# Patient Record
Sex: Female | Born: 1960 | Race: White | Hispanic: No | Smoking: Former smoker
Health system: Southern US, Community
[De-identification: ages and names within clinical notes are randomized; demographics above are authoritative.]

## PROBLEM LIST (undated history)

## (undated) DIAGNOSIS — J189 Pneumonia, unspecified organism: Secondary | ICD-10-CM

## (undated) DIAGNOSIS — G43909 Migraine, unspecified, not intractable, without status migrainosus: Secondary | ICD-10-CM

## (undated) DIAGNOSIS — E039 Hypothyroidism, unspecified: Secondary | ICD-10-CM

## (undated) DIAGNOSIS — E079 Disorder of thyroid, unspecified: Secondary | ICD-10-CM

## (undated) DIAGNOSIS — F32A Depression, unspecified: Secondary | ICD-10-CM

## (undated) DIAGNOSIS — I209 Angina pectoris, unspecified: Secondary | ICD-10-CM

## (undated) DIAGNOSIS — I10 Essential (primary) hypertension: Secondary | ICD-10-CM

## (undated) DIAGNOSIS — E785 Hyperlipidemia, unspecified: Secondary | ICD-10-CM

## (undated) DIAGNOSIS — M1990 Unspecified osteoarthritis, unspecified site: Secondary | ICD-10-CM

## (undated) DIAGNOSIS — R0600 Dyspnea, unspecified: Secondary | ICD-10-CM

## (undated) DIAGNOSIS — I2510 Atherosclerotic heart disease of native coronary artery without angina pectoris: Secondary | ICD-10-CM

## (undated) DIAGNOSIS — F329 Major depressive disorder, single episode, unspecified: Secondary | ICD-10-CM

## (undated) HISTORY — DX: Hyperlipidemia, unspecified: E78.5

## (undated) HISTORY — PX: TONSILLECTOMY: SUR1361

## (undated) HISTORY — PX: BACK SURGERY: SHX140

## (undated) HISTORY — DX: Depression, unspecified: F32.A

## (undated) HISTORY — DX: Migraine, unspecified, not intractable, without status migrainosus: G43.909

## (undated) HISTORY — PX: CHOLECYSTECTOMY: SHX55

## (undated) HISTORY — DX: Major depressive disorder, single episode, unspecified: F32.9

## (undated) HISTORY — PX: OTHER SURGICAL HISTORY: SHX169

---

## 1992-04-20 DIAGNOSIS — Z9889 Other specified postprocedural states: Secondary | ICD-10-CM | POA: Insufficient documentation

## 1998-05-31 ENCOUNTER — Encounter: Payer: Self-pay | Admitting: Emergency Medicine

## 1998-05-31 ENCOUNTER — Emergency Department (HOSPITAL_COMMUNITY): Admission: EM | Admit: 1998-05-31 | Discharge: 1998-05-31 | Payer: Self-pay | Admitting: Emergency Medicine

## 2005-03-21 ENCOUNTER — Emergency Department (HOSPITAL_COMMUNITY): Admission: EM | Admit: 2005-03-21 | Discharge: 2005-03-21 | Payer: Self-pay | Admitting: Emergency Medicine

## 2005-04-20 DIAGNOSIS — F329 Major depressive disorder, single episode, unspecified: Secondary | ICD-10-CM | POA: Insufficient documentation

## 2005-10-05 ENCOUNTER — Ambulatory Visit: Payer: Self-pay | Admitting: Critical Care Medicine

## 2007-06-27 ENCOUNTER — Encounter (INDEPENDENT_AMBULATORY_CARE_PROVIDER_SITE_OTHER): Payer: Self-pay | Admitting: General Surgery

## 2007-06-27 ENCOUNTER — Inpatient Hospital Stay (HOSPITAL_COMMUNITY): Admission: EM | Admit: 2007-06-27 | Discharge: 2007-07-04 | Payer: Self-pay | Admitting: Emergency Medicine

## 2007-06-30 ENCOUNTER — Ambulatory Visit: Payer: Self-pay | Admitting: Gastroenterology

## 2008-01-11 ENCOUNTER — Ambulatory Visit: Payer: Self-pay | Admitting: Nurse Practitioner

## 2008-01-11 DIAGNOSIS — M545 Low back pain, unspecified: Secondary | ICD-10-CM | POA: Insufficient documentation

## 2008-01-11 DIAGNOSIS — R030 Elevated blood-pressure reading, without diagnosis of hypertension: Secondary | ICD-10-CM

## 2008-01-11 DIAGNOSIS — J45909 Unspecified asthma, uncomplicated: Secondary | ICD-10-CM | POA: Insufficient documentation

## 2008-01-11 DIAGNOSIS — J309 Allergic rhinitis, unspecified: Secondary | ICD-10-CM

## 2008-01-11 DIAGNOSIS — E669 Obesity, unspecified: Secondary | ICD-10-CM | POA: Insufficient documentation

## 2008-01-11 DIAGNOSIS — Z87891 Personal history of nicotine dependence: Secondary | ICD-10-CM | POA: Insufficient documentation

## 2008-01-11 DIAGNOSIS — F17200 Nicotine dependence, unspecified, uncomplicated: Secondary | ICD-10-CM

## 2008-01-11 DIAGNOSIS — E039 Hypothyroidism, unspecified: Secondary | ICD-10-CM | POA: Insufficient documentation

## 2008-01-17 ENCOUNTER — Ambulatory Visit: Payer: Self-pay | Admitting: Internal Medicine

## 2008-01-17 LAB — CONVERTED CEMR LAB
Albumin: 4.2 g/dL (ref 3.5–5.2)
Alkaline Phosphatase: 74 units/L (ref 39–117)
CO2: 25 meq/L (ref 19–32)
Chloride: 104 meq/L (ref 96–112)
Eosinophils Absolute: 0.3 10*3/uL (ref 0.0–0.7)
Glucose, Bld: 91 mg/dL (ref 70–99)
Lymphocytes Relative: 47 % — ABNORMAL HIGH (ref 12–46)
Lymphs Abs: 1.7 10*3/uL (ref 0.7–4.0)
Neutro Abs: 1.4 10*3/uL — ABNORMAL LOW (ref 1.7–7.7)
Neutrophils Relative %: 39 % — ABNORMAL LOW (ref 43–77)
Platelets: 264 10*3/uL (ref 150–400)
Potassium: 4.5 meq/L (ref 3.5–5.3)
Sodium: 140 meq/L (ref 135–145)
TSH: 52.359 microintl units/mL — ABNORMAL HIGH (ref 0.350–4.50)
Total Protein: 7.3 g/dL (ref 6.0–8.3)
WBC: 3.7 10*3/uL — ABNORMAL LOW (ref 4.0–10.5)

## 2008-01-24 ENCOUNTER — Encounter (INDEPENDENT_AMBULATORY_CARE_PROVIDER_SITE_OTHER): Payer: Self-pay | Admitting: Nurse Practitioner

## 2008-01-30 ENCOUNTER — Emergency Department (HOSPITAL_COMMUNITY): Admission: EM | Admit: 2008-01-30 | Discharge: 2008-01-31 | Payer: Self-pay | Admitting: Emergency Medicine

## 2008-01-30 ENCOUNTER — Telehealth (INDEPENDENT_AMBULATORY_CARE_PROVIDER_SITE_OTHER): Payer: Self-pay | Admitting: Nurse Practitioner

## 2008-01-31 ENCOUNTER — Encounter (INDEPENDENT_AMBULATORY_CARE_PROVIDER_SITE_OTHER): Payer: Self-pay | Admitting: Nurse Practitioner

## 2008-01-31 DIAGNOSIS — K219 Gastro-esophageal reflux disease without esophagitis: Secondary | ICD-10-CM | POA: Insufficient documentation

## 2008-01-31 DIAGNOSIS — G43909 Migraine, unspecified, not intractable, without status migrainosus: Secondary | ICD-10-CM | POA: Insufficient documentation

## 2008-02-07 ENCOUNTER — Ambulatory Visit: Payer: Self-pay | Admitting: Internal Medicine

## 2008-02-09 ENCOUNTER — Encounter (INDEPENDENT_AMBULATORY_CARE_PROVIDER_SITE_OTHER): Payer: Self-pay | Admitting: *Deleted

## 2008-02-27 ENCOUNTER — Ambulatory Visit: Payer: Self-pay | Admitting: Internal Medicine

## 2008-03-13 ENCOUNTER — Ambulatory Visit: Payer: Self-pay | Admitting: Internal Medicine

## 2008-04-10 ENCOUNTER — Ambulatory Visit: Payer: Self-pay | Admitting: Nurse Practitioner

## 2008-04-16 ENCOUNTER — Encounter (INDEPENDENT_AMBULATORY_CARE_PROVIDER_SITE_OTHER): Payer: Self-pay | Admitting: Nurse Practitioner

## 2008-04-16 DIAGNOSIS — E7800 Pure hypercholesterolemia, unspecified: Secondary | ICD-10-CM

## 2008-04-17 ENCOUNTER — Encounter (INDEPENDENT_AMBULATORY_CARE_PROVIDER_SITE_OTHER): Payer: Self-pay | Admitting: Nurse Practitioner

## 2008-04-25 ENCOUNTER — Ambulatory Visit (HOSPITAL_COMMUNITY): Admission: RE | Admit: 2008-04-25 | Discharge: 2008-04-25 | Payer: Self-pay | Admitting: Family Medicine

## 2008-04-30 ENCOUNTER — Encounter (INDEPENDENT_AMBULATORY_CARE_PROVIDER_SITE_OTHER): Payer: Self-pay | Admitting: Internal Medicine

## 2008-04-30 ENCOUNTER — Encounter: Admission: RE | Admit: 2008-04-30 | Discharge: 2008-04-30 | Payer: Self-pay | Admitting: Family Medicine

## 2008-07-16 ENCOUNTER — Telehealth (INDEPENDENT_AMBULATORY_CARE_PROVIDER_SITE_OTHER): Payer: Self-pay | Admitting: Nurse Practitioner

## 2008-07-18 ENCOUNTER — Encounter (INDEPENDENT_AMBULATORY_CARE_PROVIDER_SITE_OTHER): Payer: Self-pay | Admitting: *Deleted

## 2008-07-18 ENCOUNTER — Ambulatory Visit: Payer: Self-pay | Admitting: Nurse Practitioner

## 2008-07-18 DIAGNOSIS — J029 Acute pharyngitis, unspecified: Secondary | ICD-10-CM | POA: Insufficient documentation

## 2008-07-25 ENCOUNTER — Ambulatory Visit: Payer: Self-pay | Admitting: Nurse Practitioner

## 2008-07-25 DIAGNOSIS — R5381 Other malaise: Secondary | ICD-10-CM | POA: Insufficient documentation

## 2008-07-25 DIAGNOSIS — R5383 Other fatigue: Secondary | ICD-10-CM

## 2008-07-25 DIAGNOSIS — J329 Chronic sinusitis, unspecified: Secondary | ICD-10-CM | POA: Insufficient documentation

## 2008-07-26 ENCOUNTER — Encounter (INDEPENDENT_AMBULATORY_CARE_PROVIDER_SITE_OTHER): Payer: Self-pay | Admitting: Nurse Practitioner

## 2008-07-26 LAB — CONVERTED CEMR LAB
ALT: 13 units/L (ref 0–35)
Albumin: 4 g/dL (ref 3.5–5.2)
Basophils Relative: 0 % (ref 0–1)
Bilirubin, Direct: 0.1 mg/dL (ref 0.0–0.3)
Cholesterol: 175 mg/dL (ref 0–200)
Eosinophils Absolute: 0.1 10*3/uL (ref 0.0–0.7)
HDL: 52 mg/dL (ref >39)
Hemoglobin: 12.6 g/dL (ref 12.0–15.0)
Lymphs Abs: 1 10*3/uL (ref 0.7–4.0)
MCHC: 32.1 g/dL (ref 30.0–36.0)
MCV: 89.9 fL (ref 78.0–100.0)
Monocytes Absolute: 0.1 10*3/uL (ref 0.1–1.0)
Monocytes Relative: 5 % (ref 3–12)
Neutro Abs: 1 10*3/uL — ABNORMAL LOW (ref 1.7–7.7)
RBC: 4.37 M/uL (ref 3.87–5.11)
Total CHOL/HDL Ratio: 3.4
VLDL: 31 mg/dL (ref 0–40)
Vit D, 25-Hydroxy: 30 ng/mL (ref 30–89)

## 2008-08-10 ENCOUNTER — Encounter (INDEPENDENT_AMBULATORY_CARE_PROVIDER_SITE_OTHER): Payer: Self-pay | Admitting: Nurse Practitioner

## 2008-08-14 ENCOUNTER — Encounter (INDEPENDENT_AMBULATORY_CARE_PROVIDER_SITE_OTHER): Payer: Self-pay | Admitting: Nurse Practitioner

## 2008-09-05 ENCOUNTER — Ambulatory Visit: Payer: Self-pay | Admitting: Nurse Practitioner

## 2008-09-05 DIAGNOSIS — R799 Abnormal finding of blood chemistry, unspecified: Secondary | ICD-10-CM

## 2008-10-23 ENCOUNTER — Ambulatory Visit: Payer: Self-pay | Admitting: Nurse Practitioner

## 2008-10-23 DIAGNOSIS — D72819 Decreased white blood cell count, unspecified: Secondary | ICD-10-CM | POA: Insufficient documentation

## 2008-10-23 DIAGNOSIS — R498 Other voice and resonance disorders: Secondary | ICD-10-CM | POA: Insufficient documentation

## 2008-10-24 LAB — CONVERTED CEMR LAB
ANA Titer 1: NEGATIVE
Anti Nuclear Antibody(ANA): POSITIVE — AB
Basophils Absolute: 0 10*3/uL (ref 0.0–0.1)
Basophils Absolute: 0 10*3/uL (ref 0.0–0.1)
Basophils Relative: 0 % (ref 0–1)
Eosinophils Relative: 12 % — ABNORMAL HIGH (ref 0–5)
HCT: 37.5 % (ref 36.0–46.0)
HCT: 41 % (ref 36.0–46.0)
Hemoglobin: 13.1 g/dL (ref 12.0–15.0)
Lymphocytes Relative: 41 % (ref 12–46)
Lymphs Abs: 1.4 10*3/uL (ref 0.7–4.0)
MCHC: 32 g/dL (ref 30.0–36.0)
Monocytes Absolute: 0.1 10*3/uL (ref 0.1–1.0)
Monocytes Relative: 4 % (ref 3–12)
Neutro Abs: 1.8 10*3/uL (ref 1.7–7.7)
Neutrophils Relative %: 51 % (ref 43–77)
Platelets: 213 10*3/uL (ref 150–400)
RBC: 4.42 M/uL (ref 3.87–5.11)
RDW: 15.5 % (ref 11.5–15.5)
RDW: 15.7 % — ABNORMAL HIGH (ref 11.5–15.5)
Sed Rate: 32 mm/hr — ABNORMAL HIGH (ref 0–22)
WBC: 3.5 10*3/uL — ABNORMAL LOW (ref 4.0–10.5)

## 2008-11-15 ENCOUNTER — Ambulatory Visit: Payer: Self-pay | Admitting: Nurse Practitioner

## 2008-11-15 LAB — CONVERTED CEMR LAB
Rhuematoid fact SerPl-aCnc: 20 intl units/mL (ref 0–20)
Total CK: 58 units/L (ref 7–177)

## 2008-11-16 ENCOUNTER — Encounter (INDEPENDENT_AMBULATORY_CARE_PROVIDER_SITE_OTHER): Payer: Self-pay | Admitting: Nurse Practitioner

## 2010-05-18 LAB — CONVERTED CEMR LAB
Chlamydia, DNA Probe: NEGATIVE
GC Probe Amp, Genital: NEGATIVE
HDL: 50 mg/dL (ref >39)
Hemoglobin: 13.1 g/dL (ref 12.0–15.0)
Ketones, urine, test strip: NEGATIVE
LDL Cholesterol: 174 mg/dL — ABNORMAL HIGH (ref 0–99)
Lymphocytes Relative: 55 % — ABNORMAL HIGH (ref 12–46)
Lymphs Abs: 1.4 10*3/uL (ref 0.7–4.0)
Monocytes Absolute: 0.3 10*3/uL (ref 0.1–1.0)
Monocytes Relative: 12 % (ref 3–12)
Neutro Abs: 0.7 10*3/uL — ABNORMAL LOW (ref 1.7–7.7)
Nitrite: NEGATIVE
RBC: 4.51 M/uL (ref 3.87–5.11)
TSH: 3.722 microintl units/mL (ref 0.350–4.50)
Total CHOL/HDL Ratio: 5.2
Urobilinogen, UA: NEGATIVE

## 2010-09-02 NOTE — Op Note (Signed)
NAME:  [name], [name] NO.:  [id number]   MEDICAL RECORD NO.:  [id number]          PATIENT TYPE:  INP   LOCATION:  5023                         FACILITY:  [facility]   PHYSICIAN:  Sharlet Salina T. Hoxworth, M.D.DATE OF BIRTH:  07/18/1960   DATE OF PROCEDURE:  07/02/2007  DATE OF DISCHARGE:                               OPERATIVE REPORT   PREOPERATIVE DIAGNOSIS:  Trocar site wound dehiscence with incarcerated  small bowel.   POSTOPERATIVE DIAGNOSIS:  Trocar site wound dehiscence with incarcerated  small bowel.   SURGICAL PROCEDURE:  Repair of trocar site wound dehiscence.   SURGEON:  Lorne Skeens. Hoxworth, M.D.   ASSISTANT:  Sandria Bales. Ezzard Standing, M.D.   ANESTHESIA:  General.   BRIEF HISTORY:  Bethany Beard is a 50 year old female who is 5 days  status post laparoscopic cholecystectomy.  She has a Hassan trocar site  in the supraumbilical area.  She had a postoperative ERCP.  She  subsequently had some nausea and vomiting initially felt secondary to  her procedures but then KUB has shown evidence of small bowel  obstruction which has been persistent.  She is obese and her wounds have  been difficult to evaluate.  Today a CT scan was obtained which shows  incarceration of a loop of small bowel through her Hasson trocar site in  the supraumbilical area.  I have recommended proceeding to the operating  room for repair.  The nature of the procedure, indications and risks  were discussed and understood.  She is now brought to the operating room  for this procedure.   DESCRIPTION OF OPERATION:  The patient was brought to the operating room  and placed in supine position on the operating room table and general  endotracheal anesthesia was induced.  She was given preoperative IV  antibiotics.  The abdomen was widely sterilely prepped and draped.  The  previous small midline supraumbilical incision was excised back to  normal skin and extended for less than a centimeter in either  direction.  Blunt dissection was then used to open the subcutaneous tissue.  The  fascia was exposed and there was a very small loop of small intestine  that had worked up through the fascia just below the figure-of-eight  Vicryl suture which remained intact.  The Vicryl suture was removed,  opening the fascial defect which allowed the loop of small bowel to be  brought up into the wound and carefully examined.  It appeared  completely normal without hemorrhage or any evidence of damage to the  bowel.  The bowel was reduced into the peritoneal cavity.  The fascial  edges were clearly  defined.  The wound was then closed with several interrupted zero  Prolene sutures.  Following this the soft tissue was infiltrated with  Marcaine.  The wound was irrigated.  The skin was closed with  subcuticular 4-0 Monocryl and Dermabond.  Sponge and needle counts were  correct.  The patient was taken to the recovery room in good condition.      Lorne Skeens. Hoxworth, M.D.  Electronically Signed     BTH/MEDQ  D:  07/02/2007  T:  07/03/2007  Job:  [id number]

## 2010-09-02 NOTE — Op Note (Signed)
NAME:  Bethany Beard, Bethany Beard NO.:  [id number]   MEDICAL RECORD NO.:  [id number]          PATIENT TYPE:  INP   LOCATION:  5023                         FACILITY:  [facility]   PHYSICIAN:  Sharlet Salina T. Hoxworth, M.D.DATE OF BIRTH:  Dec 31, 1960   DATE OF PROCEDURE:  06/27/2007  DATE OF DISCHARGE:                               OPERATIVE REPORT   PREOPERATIVE DIAGNOSIS:  Cholelithiasis and acute cholecystitis.   POSTOPERATIVE DIAGNOSIS:  Cholelithiasis and acute cholecystitis with  probable common bile duct stone.   SURGICAL PROCEDURES:  Laparoscopic cholecystectomy with intraoperative  cholangiogram and balloon catheter common bile duct exploration.   SURGEON:  Lorne Skeens. Hoxworth, M.D.   ANESTHESIA:  General.   BRIEF HISTORY:  Murel Wigle is a 50 year old female who presents  with acute epigastric pain.  CT scan has revealed gallstones and  thickened gallbladder wall consistent with acute cholecystitis.  She has  mildly elevated LFTs, SGPT 57, alkaline phosphatase 168, and bilirubin  normal.  She is felt to have acute cholecystitis, and laparoscopic  cholecystectomy with cholangiogram has been recommended and accepted.  The nature of the procedure, indications, risks of bleeding, infection,  bile leak, bile duct injury, possible need for open procedure were  discussed and understood.  She is now brought to the operating room for  this procedure.   DESCRIPTION OF OPERATION:  The patient was brought to the operating room  and placed in the supine position on the operating table, and general  orotracheal anesthesia was induced.  The abdomen was widely sterilely  prepped and draped.  She was already on broad-spectrum antibiotics.  Correct patient and procedure were verified.  Local anesthesia was used  to infiltrate the trocar sites prior to the incisions.   A 1-cm incision was made above the umbilicus, and the dissection was  carried down to the midline fascia which was  sharply incised for 1 cm  and the peritoneum entered under direct vision.  Through a mattress  suture 0 Vicryl, the Hasson trocar was inserted and pneumoperitoneum  established.  Under direct vision, an 11-mm trocar was placed in the  subxiphoid area and two 5-mm trocars along the right subcostal margin.  The gallbladder was exposed and was tense and acutely inflamed.  It was  decompressed with an aspiration needle and contained clear bile.  The  wall was quite thick.  The fundus was grasped and elevated up over the  liver.  Fibrofatty tissue was stripped off the neck of the gallbladder  toward the porta hepatis.  The infundibulum was exposed and retracted  inferolaterally, and further fibrofatty tissue was stripped down toward  the cystic duct/gallbladder junction.  The cystic artery was identified  coursing up to the gallbladder wall.  The cystic duct/gallbladder  junction was identified and dissected 360 degrees and the cystic duct  dissected out over about a centimeter.  At this point, the cystic duct  was clipped at the gallbladder junction and an operative cholangiogram  obtained through the cystic duct.  This showed good filling of a normal  common bile duct and intrahepatic ducts.  At the distal common  bile  duct, there appeared to be a small filling defect, although there was  free flow into the duodenum.  At this point, I elected to try to clear  the common duct with a balloon catheter.  A #4 Fogarty balloon catheter was able to be passed via the cystic duct  into the duodenum.  I inflated the balloon and pulled back 2 times,  sweeping the common duct and cystic duct and did not obtain any stones  or debris.  At this point, a cholangiogram was repeated through the  cystic duct.  Likely the same lucency was seen in the distal common bile  duct, but at this point it appeared possibly to be more related to a  fold in the duodenum than a definite stone.  There was free flow.  I   elected to observe at this point.  The cystic duct was triply clipped  after removal of the cholangiocath and divided.  The cystic artery  clearly seen coursing upon the gallbladder wall was divided between two  proximal and one distal clip.  The gallbladder then dissected free from  its bed using hook cautery.  It was placed in an EndoCatch bag and  brought out through the supraumbilical incision.  Complete hemostasis  was obtained of the operative site, and the right upper quadrant was  thoroughly irrigated and suctioned until clear.  The trocars were  removed under direct vision and all CO2 evacuated.  The mattress suture  was secured at the umbilicus.  The skin incisions were closed with  subcuticular Monocryl and Steri-Strips.  Sponge and needle counts were  correct.  Sterile dressings were applied.   The patient was taken to the recovery room in good condition.      Lorne Skeens. Hoxworth, M.D.  Electronically Signed     BTH/MEDQ  D:  06/27/2007  T:  06/28/2007  Job:  [id number]

## 2010-09-02 NOTE — Discharge Summary (Signed)
NAME:  Bethany Beard, PIOTROWSKI             ACCOUNT NO.:  [id number]   MEDICAL RECORD NO.:  [id number]          PATIENT TYPE:  INP   LOCATION:  5023                         FACILITY:  [facility]   PHYSICIAN:  Dr. Freida Busman              DATE OF BIRTH:  [birth year]   DATE OF ADMISSION:  06/26/2007  DATE OF DISCHARGE:  07/04/2007                               DISCHARGE SUMMARY   ADMITTING PHYSICIAN:  Velora Heckler, M.D.   DISCHARGING PHYSICIAN:  Dr. Freida Busman.   OPERATING PHYSICIAN:  Sharlet Salina T. Hoxworth, M.D.   CONSULTING PHYSICIANS:  Jordan Hawks. Elnoria Howard, M.D. with Gastroenterology and  Venita Lick. Russella Dar, M.D., [facility] with Gastroenterology.   CHIEF COMPLAINT/REASON FOR ADMISSION:  [name] is a 50 year old  female patient who presented to the ER at [facility] with a 2-week history of  intermittent right upper quadrant abdominal pain associated with nausea  and vomiting.  In the ER, an ultrasound was obtained which demonstrated  a thick-walled gallbladder with a sonographic Murphy sign as well as  cholelithiasis.  Common bile duct was markedly dilated at 10 mm and  common bile duct stones could not be excluded by ultrasound exam.  In  the ER, the patient remains symptomatic with pain, nausea, and vomiting  and therefore, general surgical admission was requested.   On clinical exam, the patient was afebrile.  Vital signs were stable.  Her abdominal exam reveals an obese, soft abdomen without prior surgical  wounds, mild-to-moderate tenderness in the right upper quadrant with  palpation, associated voluntary guarding, no palpable mass, no  rebounding.   White count 8,200 and hemoglobin 12.8.  SGPT 57 and alkaline phos 168.  Total bilirubin 0.9 and lipase 20.  Ultrasound as previously noted.   Patient was admitted by Dr. Gerrit Friends with the following diagnoses:  1. Biliary colic with subacute cholecystitis.  2. Cholelithiasis, rule out choledocholithiasis.   HOSPITAL COURSE:  The patient was admitted to the  general surgical  floor, where she was subsequently taken to the OR the following day on  June 27, 2007, by Dr. Johna Sheriff for laparoscopic cholecystectomy with  intraoperative cholangiogram and balloon catheter common bile duct  exploration.  Postprocedure intraoperative cholangiogram still remained  inconclusive for possible retained common duct stone, so GI consult was  obtained.  Dr. Russella Dar evaluated the patient and plans were to proceed  with ERCP.   On June 28, 2007, a gastroenterology consult was obtained, requested  for need for ERCP.  Dr. Russella Dar did evaluate the patient and on June 29, 2007, the patient underwent an ERCP.  No stone was seen.  I suspected  that patient had passed the stone prior to the ERCP.  The patient was  restarted on a diet and was started on Vicodin for pain while she was  tolerating full liquids. The patient in the past has tolerated Vicodin,  but she must take with food to prevent nausea and vomiting.  On postop  day 3, after the ERCP, the patient developed persistent nausea the night  before around 10 p.m., took a Vicodin and  threw up, but then had  persistent nausea and now had new left upper quadrant pain, anorexia but  she was still passing flatus.  Her abdomen was soft, she was tender  around her incisions with the new tenderness with minimal guarding in  the left upper quadrant, and bowel sounds were present.  There was an  index of suspicion for post-ERCP pancreatitis, so labs were checked and  this did reveal a mild elevation in amylase to 118 and lipase to 91.  She had been continued on IV Unasyn and her medications were changed to  IV route and she was made n.p.o.  In addition, plain abdominal films  were obtained and an unusual finding of dilated loops of small bowel in  the mid and left upper quadrant with air-fluid levels as well as air and  contrast in the colon were a concern for either an evolving ileus or  evolving partial small bowel  obstruction.  The patient was left n.p.o.  By postop day 4, the patient's amylase and lipase had resolved, and her  LFTs were normal.  She continued with nausea and vomiting and diffuse  abdominal pain especially in the upper quadrant.  She at this point was  not passing any flatus or having any bowel movements.  There was some  concern that the patient may be evolving an ileus or bowel obstruction  because of a bile leak.  HIDA scan was performed and this did not  demonstrate a bile leak.  Uncertain why the patient was having the bowel  obstruction, nothing could be seen on clinical exam of the abdomen.   By the postop day #5, the patient continued with the symptoms of the  bowel obstruction.  Plain films continued to reveal this and a CT also  demonstrated a bowel obstruction at the level of the umbilicus and small  knuckle of small bowel had protruded through the umbilical incision  trocar site.  This was not clearly apparent on clinical exam due to  patient having a morbidly obese abdomen.  These findings were discussed  with the patient for Dr. Johna Sheriff and it was determined that she needed  to return to the OR for repair of this wound dehiscence and incarcerated  small bowel.  She was taken to the OR on July 01, 2007, which was  postop day 5 by Dr. Johna Sheriff where she underwent repair of the wound  dehiscence.  The patient tolerated this procedure well and was sent back  to the surgical floor to recover.   By postop day 6, from the initial laparoscopic cholecystectomy and  postop day 1 of the hernia repair, the patient was doing very well, no  further nausea.  Abdomen is soft and nontender with normal active bowel  sounds.  Her diet was advanced as tolerated and except for having nausea  and vomiting after consuming a large milk shake rapidly, the patient has  remained asymptomatic.  By postop day 7 of the first surgery, postop day  2 of the hernia repair, the patient was doing  extremely well.  No  further nausea or vomiting with the diet since nausea and vomiting was  brought about by use of the milk shake.  She had not tried any oral pain  medications.  In the past, she has had difficulty with Vicodin, but now  she must take this with food.  We discussed this, and she will try  Vicodin at home.  She has been given a  prescription for Ultram to use  with over-the-counter Tylenol if Vicodin induces nausea or vomiting.  Otherwise, the patient reports eagerness and readiness for discharge  home and clinically she meets criteria.  Last lab workup was obtained on  July 02, 2007, AST 16, ALT 25, alkaline phosphatase 151, total  bilirubin 0.7, amylase 91, and lipase 39.   FINAL DISCHARGE DIAGNOSES:  1. Right upper quadrant abdominal pain, secondary to biliary colic and      subacute cholecystitis.  2. Status post laparoscopic cholecystectomy with abnormal      intraoperative cholangiogram.  3. Choledocholithiasis, status post endoscopic retrograde      cholangiopancreatography, stone passed prior to endoscopic      retrograde cholangiopancreatography.  4. Mild post-endoscopic retrograde cholangiopancreatography      pancreatitis, resolved.  5. Nausea and vomiting with small bowel obstruction, secondary to      umbilical trocar site with wound dehiscence and subsequent small      bowel herniation.  6. Status post repair of the trocar umbilical wound dehiscence with      associated bowel obstruction.  No small bowel resection done.   DISCHARGE MEDICATIONS:  The patient will resume the following home  medications:  1. Synthroid 112 mcg daily.  2. Celexa 40 mg daily.  3. Xanax 0.5 mg q.6 h. p.r.n. anxiety.   New medications include:  1. Vicodin 5/325 mg 1-2 tabs every 4 hours as needed for pain.  2. Over-the-counter ibuprofen 1-3 tabs every 6-8 hours as needed for      pain.  May take in addition to Vicodin, always take with food or      snack.  3. Ultram 25 mg  1 every 6 hours with 2 regular strength Tylenol if      Vicodin causes nausea or vomiting.   DISCHARGE INSTRUCTIONS:  The patient is to be out of work now for 4  weeks given the fact she had a wound dehiscence requiring repair for  hernia and small bowel entrapment at this hospitalization therefore, she  will be out of work for 4 weeks and no lifting greater than 15 pounds  for 4 weeks.   OTHER ACTIVITY:  Increase activity slowly, may walk up steps, may  shower, no driving for 1-2 weeks.   WOUND CARE:  No wound care is indicated.  The patient's skin has been  closed with Dermabond.  She does have some slight redness around the  umbilical incision that is either evolving cellulitis versus evolving  bruise.  The appearance was more consistent with the bruise in the areas  not warm and only minimally tender consistent with same tenderness at  the other incisions.  The patient has remained afebrile, and she has had  no white count.   DIET:  As previous.   FOLLOWUP:  She needs to call Dr. Johna Sheriff herself and has to be seen in  2 weeks.  In addition, she is to call the surgeon's office if:  A.  Fever greater than her or equal to 101 degrees Fahrenheit.  B.  New or increased pelvic pain.  C.  Redness or drainage from wounds.  D.  Nausea, vomiting, diarrhea, or constipation.      Allison L. Rennis Harding, N.P.    ______________________________  Dr. Freida Busman    ALE/MEDQ  D:  07/04/2007  T:  07/05/2007  Job:  [id number]   cc:   Venita Lick. Russella Dar, MD, Dalbert Mayotte T. Hoxworth, M.D.

## 2010-09-02 NOTE — H&P (Signed)
NAME:  Bethany Beard, Bethany Beard NO.:  [id number]   MEDICAL RECORD NO.:  [id number]          PATIENT TYPE:  EMS   LOCATION:  MAJO                         FACILITY:  [facility]   PHYSICIAN:  Velora Heckler, MD      DATE OF BIRTH:  [birth year]   DATE OF ADMISSION:  06/26/2007  DATE OF DISCHARGE:                              HISTORY & PHYSICAL   REFERRING PHYSICIAN:  Doug Sou, M.D., emergency department.   PRIMARY CARE PHYSICIAN:  Lovenia Kim, D.O.   CHIEF COMPLAINT:  Abdominal pain, right upper quadrant.   HISTORY OF PRESENT ILLNESS:  Bethany Beard is a 50 year old white  female from [place], [place].  She presents to the emergency  department with a 2-week history of intermittent right upper quadrant  abdominal pain associated with nausea and vomiting.  The patient  presented to the emergency department for evaluation.  Ultrasound of the  abdomen was obtained which showed a thick-walled gallbladder with  sonographic Murphy's sign.  Gallstones were present.  Common bile duct  was dilated at approximately 10 mm.  Common bile duct stones could not  be excluded.  The patient remained symptomatic with pain, nausea and  vomiting.  General surgery was called for admission.   PAST MEDICAL HISTORY:  1. History of hypothyroidism.  2. Status post knee arthroscopy.  3. Status post lumbar diskectomy.  4. Status post carpal tunnel release.   MEDICATIONS:  Synthroid, Celexa, Xanax.   ALLERGIES:  ASPIRIN, CODEINE.   SOCIAL HISTORY:  The patient is widowed.  She has one child.  She works  at [employer].  She has a degree from [employer] in  [place].  She smokes less than a pack of cigarettes a day.  She drinks alcohol on occasion.   FAMILY HISTORY:  Not obtainable.  The patient is adopted.   REVIEW OF SYSTEMS:  A 15-system review without significant other  findings except as noted above.   PHYSICAL EXAMINATION:  GENERAL APPEARANCE:   A 50 year old obese white  female, mild discomfort on a stretcher in the emergency department.  VITAL SIGNS:  Temperature 98.3, pulse 82, respirations 18, blood  pressure 136/78.  HEENT:  Normocephalic, atraumatic.  Sclerae clear.  Conjunctiva clear.  Pupils equal and reactive.  Dentition good.  Mucous membranes moist.  Voice normal.  NECK:  Palpation of the neck shows no thyroid nodularity.  No  lymphadenopathy.  No tenderness.  LUNGS:  A few scattered rhonchi, greater on the left than on the right,  otherwise clear.  CARDIAC:  Exam shows regular rate and rhythm without murmur.  Peripheral  pulses are full.  ABDOMEN:  Soft, obese, without surgical wounds.  There is mild to  moderate tenderness to palpation in the right upper quadrant with  voluntary guarding.  There is no palpable mass.  There is no pedal  splenomegaly.  EXTREMITIES:  Nontender without edema.  NEUROLOGIC:  The patient is alert and oriented without focal deficit.  There is no sign of tremor.   LABORATORY STUDIES:  White count 8.2, hemoglobin 12.8, hematocrit 37.4%,  platelet count  313,000, differential normal with 65% neutrophils, 23%  lymphocytes, 9% monocytes, 2% eosinophils.  Urinalysis is benign.  Electrolytes are normal.  Liver function tests show an elevated SGPT of  57, alkaline phosphatase of 168, normal total bilirubin of 0.9.  Lipase  is normal at 20.   RADIOGRAPHIC STUDIES:  Ultrasound showing thick-walled gallbladder  containing gallstones with a sonographic Murphy's sign and a 10 mm  common bile duct   IMPRESSION:  Biliary colic with a subacute cholecystitis, rule out  common bile duct stone.   PLAN:  The patient will be admitted on the general surgery service.  She  will be made n.p.o. after midnight.  She will be treated with  intravenous narcotics as needed for pain and intravenous Zofran as  needed for nausea.   Laparoscopic cholecystectomy was discussed with the patient.  I have  described  the procedure, the risk and the benefits.  We will attempt to  add her to the operating room schedule for Monday, June 27, 2007, for  Dr. Glenna Fellows, my partner.  The patient understands and agrees  to proceed.      Velora Heckler, MD  Electronically Signed     TMG/MEDQ  D:  06/27/2007  T:  06/27/2007  Job:  [id number]   cc:   Lovenia Kim, D.O.

## 2010-11-25 ENCOUNTER — Emergency Department (HOSPITAL_COMMUNITY)
Admission: EM | Admit: 2010-11-25 | Discharge: 2010-11-25 | Disposition: A | Discharge status: Home / Self Care | Payer: Self-pay | Attending: Emergency Medicine | Admitting: Emergency Medicine

## 2010-11-25 DIAGNOSIS — K029 Dental caries, unspecified: Secondary | ICD-10-CM | POA: Insufficient documentation

## 2010-11-25 DIAGNOSIS — K089 Disorder of teeth and supporting structures, unspecified: Secondary | ICD-10-CM | POA: Insufficient documentation

## 2011-01-12 LAB — COMPREHENSIVE METABOLIC PANEL WITH GFR
ALT: 25
ALT: 73 — ABNORMAL HIGH
AST: 16
AST: 65 — ABNORMAL HIGH
Albumin: 2.5 — ABNORMAL LOW
Albumin: 2.7 — ABNORMAL LOW
Alkaline Phosphatase: 151 — ABNORMAL HIGH
Alkaline Phosphatase: 193 — ABNORMAL HIGH
BUN: 12
BUN: 5 — ABNORMAL LOW
CO2: 28
CO2: 34 — ABNORMAL HIGH
Calcium: 8.7
Calcium: 9.2
Chloride: 107
Chloride: 96
Creatinine, Ser: 0.7
Creatinine, Ser: 1.38 — ABNORMAL HIGH
GFR calc non Af Amer: 41 — ABNORMAL LOW
GFR calc non Af Amer: >60
Glucose, Bld: 116 — ABNORMAL HIGH
Glucose, Bld: 122 — ABNORMAL HIGH
Potassium: 3.7
Potassium: 3.7
Sodium: 139
Sodium: 140
Total Bilirubin: 0.7
Total Bilirubin: 0.8
Total Protein: 5.3 — ABNORMAL LOW
Total Protein: 5.9 — ABNORMAL LOW

## 2011-01-12 LAB — CBC
HCT: 31.1 — ABNORMAL LOW
HCT: 34.6 — ABNORMAL LOW
HCT: 35.4 — ABNORMAL LOW
Hemoglobin: 11.6 — ABNORMAL LOW
Hemoglobin: 12
Hemoglobin: 12.8
MCHC: 33.5
MCHC: 33.7
MCHC: 34
MCHC: 34.1
MCV: 88.4
MCV: 88.8
Platelets: 224
Platelets: 347
Platelets: 355
RBC: 4
RDW: 15.4
RDW: 15.4
RDW: 15.9 — ABNORMAL HIGH
WBC: 5.4
WBC: 6.8

## 2011-01-12 LAB — URINALYSIS, ROUTINE W REFLEX MICROSCOPIC
Glucose, UA: NEGATIVE
Hgb urine dipstick: NEGATIVE
Protein, ur: NEGATIVE
Specific Gravity, Urine: 1.013
Urobilinogen, UA: 1

## 2011-01-12 LAB — DIFFERENTIAL
Basophils Absolute: 0
Basophils Absolute: 0
Basophils Absolute: 0
Basophils Relative: 0
Basophils Relative: 1
Basophils Relative: 1
Eosinophils Absolute: 0.2
Eosinophils Absolute: 0.4
Eosinophils Relative: 4
Eosinophils Relative: 7 — ABNORMAL HIGH
Lymphocytes Relative: 21
Lymphocytes Relative: 23
Lymphocytes Relative: 24
Lymphs Abs: 1.2
Lymphs Abs: 1.2
Monocytes Absolute: 0.4
Monocytes Absolute: 0.6
Monocytes Relative: 11
Monocytes Relative: 7
Neutro Abs: 2.9
Neutro Abs: 3.6
Neutro Abs: 5.4
Neutrophils Relative %: 58
Neutrophils Relative %: 65
Neutrophils Relative %: 67

## 2011-01-12 LAB — COMPREHENSIVE METABOLIC PANEL
ALT: 57 — ABNORMAL HIGH
AST: 28
Albumin: 2.6 — ABNORMAL LOW
Albumin: 2.7 — ABNORMAL LOW
Albumin: 3.3 — ABNORMAL LOW
Alkaline Phosphatase: 168 — ABNORMAL HIGH
Alkaline Phosphatase: 180 — ABNORMAL HIGH
Alkaline Phosphatase: 197 — ABNORMAL HIGH
BUN: 11
BUN: 7
CO2: 29
Calcium: 8.9
Calcium: 9
Chloride: 100
Creatinine, Ser: 0.84
Creatinine, Ser: 0.92
GFR calc Af Amer: >60
GFR calc non Af Amer: >60
Glucose, Bld: 100 — ABNORMAL HIGH
Glucose, Bld: 111 — ABNORMAL HIGH
Potassium: 3.7
Potassium: 4
Potassium: 4.1
Sodium: 135
Total Bilirubin: 0.5
Total Protein: 6
Total Protein: 6.7

## 2011-01-12 LAB — LIPASE, BLOOD
Lipase: 20
Lipase: 25
Lipase: 39
Lipase: 91 — ABNORMAL HIGH

## 2011-01-12 LAB — PROTIME-INR
INR: 1
Prothrombin Time: 13

## 2011-01-12 LAB — APTT: aPTT: 27

## 2011-01-12 LAB — POCT PREGNANCY, URINE
Operator id: 277751
Preg Test, Ur: NEGATIVE

## 2011-01-12 LAB — AMYLASE
Amylase: 118
Amylase: 73
Amylase: 91

## 2011-03-13 ENCOUNTER — Encounter: Payer: Self-pay | Admitting: Emergency Medicine

## 2011-03-13 ENCOUNTER — Emergency Department (HOSPITAL_COMMUNITY)
Admission: EM | Admit: 2011-03-13 | Discharge: 2011-03-13 | Disposition: A | Discharge status: Home / Self Care | Payer: Self-pay | Attending: Emergency Medicine | Admitting: Emergency Medicine

## 2011-03-13 DIAGNOSIS — K047 Periapical abscess without sinus: Secondary | ICD-10-CM | POA: Insufficient documentation

## 2011-03-13 DIAGNOSIS — E079 Disorder of thyroid, unspecified: Secondary | ICD-10-CM | POA: Insufficient documentation

## 2011-03-13 DIAGNOSIS — F17200 Nicotine dependence, unspecified, uncomplicated: Secondary | ICD-10-CM | POA: Insufficient documentation

## 2011-03-13 DIAGNOSIS — J45909 Unspecified asthma, uncomplicated: Secondary | ICD-10-CM | POA: Insufficient documentation

## 2011-03-13 HISTORY — DX: Disorder of thyroid, unspecified: E07.9

## 2011-03-13 MED ORDER — HYDROCODONE-ACETAMINOPHEN 5-325 MG PO TABS: 1.0000 | ORAL_TABLET | ORAL | Status: AC | PRN

## 2011-03-13 MED ORDER — PENICILLIN V POTASSIUM 500 MG PO TABS: 500.0000 mg | ORAL_TABLET | Freq: Three times a day (TID) | ORAL | Status: AC

## 2011-03-13 NOTE — ED Notes (Signed)
States she got toothache wed morning.  Wanted to wait to go to the dentist but can't wait.

## 2011-03-13 NOTE — ED Provider Notes (Signed)
History     CSN: [id number] Arrival date & time: 03/13/2011  7:17 PM   First MD Initiated Contact with Patient 03/13/11 1945      Chief Complaint  Patient presents with  . Dental Pain    Patient is a 50 y.o. female presenting with tooth pain. The history is provided by the patient.  Dental PainThe primary symptoms include mouth pain. The symptoms began 2 days ago. The symptoms are worsening. The symptoms occur constantly.  Reports onset of toothache Wednesday to (R) lower molar that has worsened.    Past Medical History  Diagnosis Date  . Thyroid disease   . Asthma     Past Surgical History  Procedure Date  . Cholecystectomy   . Back surgery   . Carpel     History reviewed. No pertinent family history.  History  Substance Use Topics  . Smoking status: Current Everyday Smoker  . Smokeless tobacco: Not on file  . Alcohol Use: Yes     somtimes    OB History    Grav Para Term Preterm Abortions TAB SAB Ect Mult Living                  Review of Systems  Constitutional: Negative.   HENT: Negative.   Eyes: Negative.   Respiratory: Negative.   Cardiovascular: Negative.   Gastrointestinal: Negative.   Genitourinary: Negative.   Musculoskeletal: Negative.   Skin: Negative.   Neurological: Negative.   Hematological: Negative.   Psychiatric/Behavioral: Negative.     Allergies  Aspirin and Codeine  Home Medications  No current outpatient prescriptions on file.  BP 140/82  Pulse 66  Temp(Src) 97.8 F (36.6 C) (Oral)  Resp 16  SpO2 97%  Physical Exam  Constitutional: She is oriented to person, place, and time. She appears well-developed and well-nourished.  HENT:  Head: Normocephalic and atraumatic.  Mouth/Throat:         Mild swelling and erythema noted surrounding (R) lower third molar. Mild (R) lower facial swelling.  Eyes: Conjunctivae are normal.  Neck: Normal range of motion.  Cardiovascular: Normal rate.   Pulmonary/Chest: Effort normal.    Musculoskeletal: Normal range of motion.  Neurological: She is alert and oriented to person, place, and time. She has normal reflexes.  Skin: Skin is warm and dry.  Psychiatric: She has a normal mood and affect.    ED Course  Procedures (including critical care time)  Labs Reviewed - No data to display No results found.   No diagnosis found.    MDM  Early dental abscess        Leanne Chang, NP 03/13/11 2024

## 2011-03-13 NOTE — ED Notes (Signed)
Pt states that pain started Wednesday and worsen yesterday. Pt was at work today, however, pains is severe and pt needed to be seen. At this time no dental insurance. Noted right face swelling, bottom tooth

## 2011-03-14 NOTE — ED Provider Notes (Signed)
Medical screening examination/treatment/procedure(s) were performed by non-physician practitioner and as supervising physician I was immediately available for consultation/collaboration.  Flint Melter, MD 03/14/11 [phone]

## 2011-06-23 ENCOUNTER — Emergency Department (HOSPITAL_COMMUNITY)
Admission: EM | Admit: 2011-06-23 | Discharge: 2011-06-23 | Disposition: A | Discharge status: Home / Self Care | Payer: 59 | Attending: Emergency Medicine | Admitting: Emergency Medicine

## 2011-06-23 ENCOUNTER — Encounter (HOSPITAL_COMMUNITY): Payer: Self-pay | Admitting: *Deleted

## 2011-06-23 DIAGNOSIS — F17200 Nicotine dependence, unspecified, uncomplicated: Secondary | ICD-10-CM | POA: Insufficient documentation

## 2011-06-23 DIAGNOSIS — J069 Acute upper respiratory infection, unspecified: Secondary | ICD-10-CM | POA: Insufficient documentation

## 2011-06-23 DIAGNOSIS — E079 Disorder of thyroid, unspecified: Secondary | ICD-10-CM | POA: Insufficient documentation

## 2011-06-23 DIAGNOSIS — J45909 Unspecified asthma, uncomplicated: Secondary | ICD-10-CM | POA: Insufficient documentation

## 2011-06-23 LAB — POCT I-STAT, CHEM 8
BUN: 14 mg/dL (ref 6–23)
Chloride: 104 mEq/L (ref 96–112)
Potassium: 3.9 mEq/L (ref 3.5–5.1)
Sodium: 141 mEq/L (ref 135–145)
TCO2: 29 mmol/L (ref 0–100)

## 2011-06-23 LAB — RAPID STREP SCREEN (MED CTR MEBANE ONLY): Streptococcus, Group A Screen (Direct): NEGATIVE

## 2011-06-23 MED ORDER — ONDANSETRON HCL 4 MG/2ML IJ SOLN
4.0000 mg | Freq: Once | INTRAMUSCULAR | Status: AC
  Administered 2011-06-23: 4 mg via INTRAVENOUS
  Filled 2011-06-23: qty 2

## 2011-06-23 MED ORDER — SODIUM CHLORIDE 0.9 % IV BOLUS (SEPSIS)
1000.0000 mL | Freq: Once | INTRAVENOUS | Status: AC
  Administered 2011-06-23: 1000 mL via INTRAVENOUS

## 2011-06-23 MED ORDER — ALBUTEROL SULFATE HFA 108 (90 BASE) MCG/ACT IN AERS: 1.0000 | INHALATION_SPRAY | Freq: Four times a day (QID) | RESPIRATORY_TRACT | Status: DC | PRN

## 2011-06-23 MED ORDER — LOPERAMIDE HCL 2 MG PO CAPS: 2.0000 mg | ORAL_CAPSULE | Freq: Four times a day (QID) | ORAL | Status: AC | PRN

## 2011-06-23 NOTE — Discharge Instructions (Signed)
Ms Clodfelter your strep test today was normal.  We gave you a bag of fluid and zofran for nausea.  We gave you an rx for nausea as well.  You can take the imodium if the diarrhea comes back.  Use the inhaler no more than 4 times a day.  Follow up with a PCP from the list below.  Return for high fever.  A cool mist vaporizer may help your cough. You can also take benadryl 25mg  every 4 hours for nasal congestion and post nasal drip. Antibiotic Nonuse  Your caregiver felt that the infection or problem was not one that would be helped with an antibiotic. Infections may be caused by viruses or bacteria. Only a caregiver can tell which one of these is the likely cause of an illness. A cold is the most common cause of infection in both adults and children. A cold is a virus. Antibiotic treatment will have no effect on a viral infection. Viruses can lead to many lost days of work caring for sick children and many missed days of school. Children may catch as many as 10 "colds" or "flus" per year during which they can be tearful, cranky, and uncomfortable. The goal of treating a virus is aimed at keeping the ill person comfortable. Antibiotics are medications used to help the body fight bacterial infections. There are relatively few types of bacteria that cause infections but there are hundreds of viruses. While both viruses and bacteria cause infection they are very different types of germs. A viral infection will typically go away by itself within 7 to 10 days. Bacterial infections may spread or get worse without antibiotic treatment. Examples of bacterial infections are:  Sore throats (like strep throat or tonsillitis).   Infection in the lung (pneumonia).   Ear and skin infections.  Examples of viral infections are:  Colds or flus.   Most coughs and bronchitis.   Sore throats not caused by Strep.   Runny noses.  It is often best not to take an antibiotic when a viral infection is the cause of the problem.  Antibiotics can kill off the helpful bacteria that we have inside our body and allow harmful bacteria to start growing. Antibiotics can cause side effects such as allergies, nausea, and diarrhea without helping to improve the symptoms of the viral infection. Additionally, repeated uses of antibiotics can cause bacteria inside of our body to become resistant. That resistance can be passed onto harmful bacterial. The next time you have an infection it may be harder to treat if antibiotics are used when they are not needed. Not treating with antibiotics allows our own immune system to develop and take care of infections more efficiently. Also, antibiotics will work better for [place] when they are prescribed for bacterial infections. Treatments for a child that is ill may include:  Give extra fluids throughout the day to stay hydrated.   Get plenty of rest.   Only give your child over-the-counter or prescription medicines for pain, discomfort, or fever as directed by your caregiver.   The use of a cool mist humidifier may help stuffy noses.   Cold medications if suggested by your caregiver.  Your caregiver may decide to start you on an antibiotic if:  The problem you were seen for today continues for a longer length of time than expected.   You develop a secondary bacterial infection.  SEEK MEDICAL CARE IF:  Fever lasts longer than 5 days.   Symptoms continue to get  worse after 5 to 7 days or become severe.   Difficulty in breathing develops.   Signs of dehydration develop (poor drinking, rare urinating, dark colored urine).   Changes in behavior or worsening tiredness (listlessness or lethargy).  Document Released: 06/15/2001 Document Revised: 03/26/2011 Document Reviewed: 12/12/2008 ExitCare Patient Information 2012 [place], [place]  RESOURCE GUIDE  Dental Problems  Patients with Medicaid: [facility] [phone]                                            [phone] Phone:  [phone]                                                  Phone:  [phone]  If unable to pay or uninsured, contact:  [facility] or [facility]. to become qualified for the adult dental clinic.  Chronic Pain Problems Contact [facility]  [phone] Patients need to be referred by their primary care doctor.  Insufficient Money for Medicine Contact [employer]:  call "211" or [facility] [phone].  No Primary Care Doctor Call [facility]  [phone] Other agencies that provide inexpensive medical care    [facility]  [phone]    [facility]  [phone]    [facility]  [phone]    [facility]  [phone]    [facility]  [phone]    [facility]  [phone]  Psychological Services [facility]  [phone] [facility]  [phone] [facility]   [phone] (emergency services [phone])  Substance Abuse Resources [facility]  [phone] [facility] [phone] [facility] [phone] Floydene Flock [phone] Residential & Outpatient Substance Abuse Program  [phone]  Abuse/Neglect [facility] Child Abuse Hotline [phone]) 471-8979 [facility] Child Abuse Hotline [phone] (After Hours)  Emergency Shelter [facility] [phone]  Maternity Homes [facility] [place] [facility] [phone] [facility] [phone]  MRSA Hotline #:   518-136-0660    [facility] Resources  [facility]     [employer]                          [facility] Dept. [address] [place]                       [address]      [address]  [place]                                                Cristobal Goldmann Phone:  [phone]  Phone:  [phone]                 Phone:  [phone]  [facility] Phone:  [phone]  [facility] Child Abuse Hotline ([phone] (After Hours)

## 2011-06-23 NOTE — ED Notes (Signed)
Pt in c/o sore throat, cough, congestion, and diarrhea since sunday

## 2011-06-24 NOTE — ED Provider Notes (Signed)
Medical screening examination/treatment/procedure(s) were performed by non-physician practitioner and as supervising physician I was immediately available for consultation/collaboration.   Forbes Cellar, MD 06/24/11 2308

## 2011-06-24 NOTE — ED Provider Notes (Signed)
History     CSN: [id number]  Arrival date & time 06/23/11  1546   First MD Initiated Contact with Patient 06/23/11 1837      Chief Complaint  Patient presents with  . URI    (Consider location/radiation/quality/duration/timing/severity/associated sxs/prior treatment) Patient is a 51 y.o. female presenting with URI. The history is provided by the patient. No language interpreter was used.  URI The primary symptoms include fatigue, sore throat, cough, nausea and arthralgias. Primary symptoms do not include fever, headaches, ear pain, swollen glands, wheezing, abdominal pain, vomiting or rash. The current episode started 2 days ago. This is a new problem. The problem has been gradually worsening.  The onset of the illness is associated with exposure to sick contacts. Symptoms associated with the illness include congestion and rhinorrhea. The illness is not associated with chills, plugged ear sensation, facial pain or sinus pressure.    Past Medical History  Diagnosis Date  . Thyroid disease   . Asthma     Past Surgical History  Procedure Date  . Cholecystectomy   . Back surgery   . Carpel     History reviewed. No pertinent family history.  History  Substance Use Topics  . Smoking status: Current Everyday Smoker  . Smokeless tobacco: Not on file  . Alcohol Use: Yes     somtimes    OB History    Grav Para Term Preterm Abortions TAB SAB Ect Mult Living                  Review of Systems  Constitutional: Positive for fatigue. Negative for fever and chills.  HENT: Positive for congestion, sore throat and rhinorrhea. Negative for ear pain and sinus pressure.   Respiratory: Positive for cough. Negative for wheezing.   Gastrointestinal: Positive for nausea. Negative for vomiting and abdominal pain.  Musculoskeletal: Positive for arthralgias.  Skin: Negative for rash.  Neurological: Negative for headaches.  All other systems reviewed and are negative.    Allergies    Aspirin and Codeine  Home Medications   Current Outpatient Rx  Name Route Sig Dispense Refill  . NAPROXEN SODIUM 220 MG PO TABS Oral Take 440 mg by mouth 2 (two) times daily as needed. For pain.    Marland Kitchen OMEPRAZOLE 20 MG PO CPDR Oral Take 20 mg by mouth daily.    . ALBUTEROL SULFATE HFA 108 (90 BASE) MCG/ACT IN AERS Inhalation Inhale 1-2 puffs into the lungs every 6 (six) hours as needed for wheezing. 1 Inhaler 0  . LOPERAMIDE HCL 2 MG PO CAPS Oral Take 1 capsule (2 mg total) by mouth 4 (four) times daily as needed for diarrhea or loose stools. 12 capsule 0    BP 122/59  Pulse 62  Temp(Src) 97.8 F (36.6 C) (Oral)  Resp 16  SpO2 97%  Physical Exam  Nursing note and vitals reviewed. Constitutional: She is oriented to person, place, and time. She appears well-developed and well-nourished.  HENT:  Head: Normocephalic and atraumatic.  Right Ear: Tympanic membrane normal.  Left Ear: Tympanic membrane normal.  Nose: Mucosal edema and rhinorrhea present. Right sinus exhibits no maxillary sinus tenderness and no frontal sinus tenderness. Left sinus exhibits no maxillary sinus tenderness and no frontal sinus tenderness.  Mouth/Throat: Uvula is midline and mucous membranes are normal. Posterior oropharyngeal erythema present. No oropharyngeal exudate, posterior oropharyngeal edema or tonsillar abscesses.  Eyes: Conjunctivae and EOM are normal. Pupils are equal, round, and reactive to light.  Neck: Normal range of  motion. Neck supple.  Cardiovascular: Normal rate, regular rhythm, normal heart sounds and intact distal pulses.  Exam reveals no gallop and no friction rub.   No murmur heard. Pulmonary/Chest: Effort normal and breath sounds normal. No respiratory distress. She has no wheezes. She has no rales. She exhibits no tenderness.  Abdominal: Soft. Bowel sounds are normal. She exhibits no distension. There is no tenderness. There is no rebound and no guarding.  Musculoskeletal: Normal range of  motion. She exhibits tenderness. She exhibits no edema.  Neurological: She is alert and oriented to person, place, and time. She has normal reflexes. No cranial nerve deficit.  Skin: Skin is warm and dry.  Psychiatric: She has a normal mood and affect.    ED Course  Procedures (including critical care time)  Labs Reviewed  POCT I-STAT, CHEM 8 - Abnormal; Notable for the following:    Creatinine, Ser 1.20 (*)    All other components within normal limits  RAPID STREP SCREEN  LAB REPORT - SCANNED   No results found.   1. Upper respiratory virus       MDM  50yo with c/o sore throat, cough, congestion and diarrhea x 2 days. pmh thyroid dz and asthma. Better after rehydration in the ER.  Strep -.  HFA ventolin inhaler given as well as  Immodium.  Will follow up with pcp from list or return if worse. Lungs clear.   Labs Reviewed  POCT I-STAT, CHEM 8 - Abnormal; Notable for the following:    Creatinine, Ser 1.20 (*)    All other components within normal limits  RAPID STREP SCREEN  LAB REPORT - SCANNED        Jethro Bastos, NP 06/24/11 1722

## 2011-09-16 ENCOUNTER — Ambulatory Visit (INDEPENDENT_AMBULATORY_CARE_PROVIDER_SITE_OTHER): Payer: 59 | Admitting: Family Medicine

## 2011-09-16 VITALS — BP 155/80 | HR 65 | Temp 98.3°F | Resp 16 | Ht 67.18 in | Wt 260.4 lb

## 2011-09-16 DIAGNOSIS — R220 Localized swelling, mass and lump, head: Secondary | ICD-10-CM

## 2011-09-16 DIAGNOSIS — E039 Hypothyroidism, unspecified: Secondary | ICD-10-CM

## 2011-09-16 DIAGNOSIS — R221 Localized swelling, mass and lump, neck: Secondary | ICD-10-CM

## 2011-09-16 LAB — TSH: TSH: 50.089 u[IU]/mL — ABNORMAL HIGH (ref 0.350–4.500)

## 2011-09-16 LAB — T4, FREE: Free T4: 0.33 ng/dL — ABNORMAL LOW (ref 0.80–1.80)

## 2011-09-16 MED ORDER — AMOXICILLIN 875 MG PO TABS: 875.0000 mg | ORAL_TABLET | Freq: Two times a day (BID) | ORAL | Status: AC

## 2011-09-16 MED ORDER — PREDNISONE 20 MG PO TABS: 40.0000 mg | ORAL_TABLET | Freq: Every day | ORAL | Status: AC

## 2011-09-16 NOTE — Progress Notes (Signed)
51 yo woman who works at [employer] and complains of left facial swelling.  Her symptoms began after getting piece of shrimp in left gum on Monday.  Mild soreness upper left lip area.  No fever  No weakness in face   O: Mildly swollen left face without paresis Patient's bridges are intact; no gum swelling or redness Nasal passages:  Normal TM's normal Neck supple, no adenopathy  A:  Possible early dental cellulitis  P;  Amoxicillin and prednisone 40 qd x 5   Also, patient needs her thyroid checked.  She has h/o hypothyroidism but until recently did not have health insurance  P:  Check TSH

## 2011-09-17 ENCOUNTER — Other Ambulatory Visit: Payer: Self-pay | Admitting: Family Medicine

## 2011-09-17 DIAGNOSIS — E039 Hypothyroidism, unspecified: Secondary | ICD-10-CM

## 2011-09-17 MED ORDER — LEVOTHYROXINE SODIUM 50 MCG PO TABS: ORAL_TABLET | ORAL | Status: DC

## 2012-01-04 ENCOUNTER — Other Ambulatory Visit (INDEPENDENT_AMBULATORY_CARE_PROVIDER_SITE_OTHER): Payer: 59

## 2012-01-04 ENCOUNTER — Encounter: Payer: Self-pay | Admitting: Internal Medicine

## 2012-01-04 ENCOUNTER — Ambulatory Visit (INDEPENDENT_AMBULATORY_CARE_PROVIDER_SITE_OTHER): Payer: 59 | Admitting: Internal Medicine

## 2012-01-04 VITALS — BP 130/72 | HR 73 | Temp 98.0°F | Resp 16 | Wt 270.5 lb

## 2012-01-04 DIAGNOSIS — E7800 Pure hypercholesterolemia, unspecified: Secondary | ICD-10-CM

## 2012-01-04 DIAGNOSIS — E039 Hypothyroidism, unspecified: Secondary | ICD-10-CM

## 2012-01-04 DIAGNOSIS — L301 Dyshidrosis [pompholyx]: Secondary | ICD-10-CM | POA: Insufficient documentation

## 2012-01-04 DIAGNOSIS — F329 Major depressive disorder, single episode, unspecified: Secondary | ICD-10-CM

## 2012-01-04 DIAGNOSIS — B353 Tinea pedis: Secondary | ICD-10-CM

## 2012-01-04 DIAGNOSIS — Z23 Encounter for immunization: Secondary | ICD-10-CM

## 2012-01-04 LAB — LIPID PANEL: HDL: 70.4 mg/dL (ref >39.00)

## 2012-01-04 LAB — CBC WITH DIFFERENTIAL/PLATELET
Eosinophils Absolute: 0.3 10*3/uL (ref 0.0–0.7)
Lymphocytes Relative: 39.6 % (ref 12.0–46.0)
MCHC: 32.8 g/dL (ref 30.0–36.0)
MCV: 92.8 fl (ref 78.0–100.0)
Monocytes Absolute: 0.2 10*3/uL (ref 0.1–1.0)
Neutrophils Relative %: 46.8 % (ref 43.0–77.0)
Platelets: 202 10*3/uL (ref 150.0–400.0)
WBC: 3.5 10*3/uL — ABNORMAL LOW (ref 4.5–10.5)

## 2012-01-04 LAB — COMPREHENSIVE METABOLIC PANEL
Albumin: 4.1 g/dL (ref 3.5–5.2)
Alkaline Phosphatase: 57 U/L (ref 39–117)
BUN: 19 mg/dL (ref 6–23)
Calcium: 9.4 mg/dL (ref 8.4–10.5)
Chloride: 105 mEq/L (ref 96–112)
Glucose, Bld: 109 mg/dL — ABNORMAL HIGH (ref 70–99)
Potassium: 3.9 mEq/L (ref 3.5–5.1)
Sodium: 140 mEq/L (ref 135–145)
Total Protein: 7.7 g/dL (ref 6.0–8.3)

## 2012-01-04 LAB — TSH: TSH: 48.59 u[IU]/mL — ABNORMAL HIGH (ref 0.35–5.50)

## 2012-01-04 LAB — LDL CHOLESTEROL, DIRECT: Direct LDL: 207.2 mg/dL

## 2012-01-04 MED ORDER — CICLOPIROX OLAMINE 0.77 % EX SUSP: 1.0000 | Freq: Two times a day (BID) | CUTANEOUS | Status: DC

## 2012-01-04 MED ORDER — TRAZODONE HCL 50 MG PO TABS: 50.0000 mg | ORAL_TABLET | Freq: Every day | ORAL | Status: DC

## 2012-01-04 MED ORDER — CITALOPRAM HYDROBROMIDE 20 MG PO TABS: 20.0000 mg | ORAL_TABLET | Freq: Every day | ORAL | Status: DC

## 2012-01-04 MED ORDER — LEVOTHYROXINE SODIUM 50 MCG PO TABS: ORAL_TABLET | ORAL | Status: DC

## 2012-01-04 NOTE — Patient Instructions (Signed)
Hypothyroidism The thyroid is a large gland located in the lower front of your neck. The thyroid gland helps control metabolism. Metabolism is how your body handles food. It controls metabolism with the hormone thyroxine. When this gland is underactive (hypothyroid), it produces too little hormone.  CAUSES These include:   Absence or destruction of thyroid tissue.   Goiter due to iodine deficiency.   Goiter due to medications.   Congenital defects (since birth).   Problems with the pituitary. This causes a lack of TSH (thyroid stimulating hormone). This hormone tells the thyroid to turn out more hormone.  SYMPTOMS  Lethargy (feeling as though you have no energy)   Cold intolerance   Weight gain (in spite of normal food intake)   Dry skin   Coarse hair   Menstrual irregularity (if severe, may lead to infertility)   Slowing of thought processes  Cardiac problems are also caused by insufficient amounts of thyroid hormone. Hypothyroidism in the newborn is cretinism, and is an extreme form. It is important that this form be treated adequately and immediately or it will lead rapidly to retarded physical and mental development. DIAGNOSIS  To prove hypothyroidism, your caregiver may do blood tests and ultrasound tests. Sometimes the signs are hidden. It may be necessary for your caregiver to watch this illness with blood tests either before or after diagnosis and treatment. TREATMENT  Low levels of thyroid hormone are increased by using synthetic thyroid hormone. This is a safe, effective treatment. It usually takes about four weeks to gain the full effects of the medication. After you have the full effect of the medication, it will generally take another four weeks for problems to leave. Your caregiver may start you on low doses. If you have had heart problems the dose may be gradually increased. It is generally not an emergency to get rapidly to normal. HOME CARE INSTRUCTIONS   Take  your medications as your caregiver suggests. Let your caregiver know of any medications you are taking or start taking. Your caregiver will help you with dosage schedules.   As your condition improves, your dosage needs may increase. It will be necessary to have continuing blood tests as suggested by your caregiver.   Report all suspected medication side effects to your caregiver.  SEEK MEDICAL CARE IF: Seek medical care if you develop:  Sweating.   Tremulousness (tremors).   Anxiety.   Rapid weight loss.   Heat intolerance.   Emotional swings.   Diarrhea.   Weakness.  SEEK IMMEDIATE MEDICAL CARE IF:  You develop chest pain, an irregular heart beat (palpitations), or a rapid heart beat. MAKE SURE YOU:   Understand these instructions.   Will watch your condition.   Will get help right away if you are not doing well or get worse.  Document Released: 04/06/2005 Document Revised: 03/26/2011 Document Reviewed: 11/25/2007 [facility] Patient Information 2012 [place], [place].Athlete's Foot Your exam shows you have athlete's foot, a fungus infection that usually starts between the toes. This condition is made worse by heat, moisture, and friction. To treat it you should wash your feet 2-3 times daily, drying well especially between the toes. Wear cotton socks to absorb sweat and change them twice a day if possible. You should allow your feet to be open to the air as much as possible and wear sandals when possible. The first step in treatment is prevention:  Do not share towels.   Wear sandals or other foot protection in wet areas such as  locker rooms, shared showers and public swimming pools.   Keep your feet dry. Wear shoes that allow air to circulate and use cotton or wool socks.  Many products are available to treat athlete's foot. Most are medications that kill fungus and are available as powders or creams. Some require a prescription, some do not. Your caregiver or pharmacist can  make a suggestion. Do not use steroid creams on athlete's foot. SEEK MEDICAL CARE IF:   You develop a temperature with no other apparent cause.   You develop swelling and soreness & redness (inflammation) in your foot.   The infection is spreading. Your foot becomes swollen, hot and red (inflamed).   Treatment is not helping.   Athlete's foot is not better in 7 days or completely cured in 30 days.   You have any problems that may be related to the medicine you are taking.  Document Released: 05/14/2004 Document Revised: 12/17/2010 Document Reviewed: 02/19/2009 [facility] Patient Information 2012 [place], [place].

## 2012-01-04 NOTE — Assessment & Plan Note (Signed)
Will treat with loprox 

## 2012-01-04 NOTE — Assessment & Plan Note (Signed)
She has had a good response in the past to trazodone and celexa so will restart that today

## 2012-01-04 NOTE — Progress Notes (Signed)
Subjective:    Patient ID: Bethany Beard, female    DOB: May 10, 1960, 51 y.o.   MRN: [id number]  Rash This is a recurrent problem. The current episode started 1 to 4 weeks ago. The problem has been gradually worsening since onset. The affected locations include the right hand, right foot and left foot. The rash is characterized by redness, scaling and itchiness. She was exposed to nothing. Associated symptoms include fatigue. Pertinent negatives include no cough, diarrhea, fever or shortness of breath. Past treatments include topical steroids. The treatment provided mild relief.  Thyroid Problem Presents for follow-up visit. Symptoms include depressed mood, fatigue, hair loss, hoarse voice and weight gain. Patient reports no anxiety, cold intolerance, constipation, diaphoresis, diarrhea, dry skin, heat intolerance, leg swelling, menstrual problem, nail problem, palpitations, tremors, visual change or weight loss. The symptoms have been worsening.      Review of Systems  Constitutional: Positive for weight gain and fatigue. Negative for fever, chills, weight loss, diaphoresis, activity change, appetite change and unexpected weight change.  HENT: Positive for hoarse voice.   Eyes: Negative.   Respiratory: Negative for cough, chest tightness, shortness of breath, wheezing and stridor.   Cardiovascular: Negative for chest pain, palpitations and leg swelling.  Gastrointestinal: Negative for nausea, abdominal pain, diarrhea, constipation, blood in stool, anal bleeding and rectal pain.  Genitourinary: Negative for menstrual problem.  Musculoskeletal: Negative for myalgias, back pain, joint swelling, arthralgias and gait problem.  Skin: Positive for rash. Negative for color change, pallor and wound.  Neurological: Negative for tremors.  Hematological: Negative for cold intolerance, heat intolerance and adenopathy. Does not bruise/bleed easily.  Psychiatric/Behavioral: Positive for disturbed wake/sleep  cycle (some DFA), dysphoric mood and decreased concentration. Negative for suicidal ideas, hallucinations, behavioral problems, confusion, self-injury and agitation. The patient is not nervous/anxious and is not hyperactive.        Objective:   Physical Exam  Vitals reviewed. Constitutional: She is oriented to person, place, and time. She appears well-developed and well-nourished. No distress.  HENT:  Head: Normocephalic and atraumatic.  Mouth/Throat: Oropharynx is clear and moist. No oropharyngeal exudate.  Eyes: Conjunctivae normal are normal. Right eye exhibits no discharge. Left eye exhibits no discharge. No scleral icterus.  Neck: Normal range of motion. Neck supple. No JVD present. No tracheal deviation present. No thyromegaly present.  Cardiovascular: Normal rate, regular rhythm, normal heart sounds and intact distal pulses.  Exam reveals no gallop and no friction rub.   No murmur heard. Pulmonary/Chest: Effort normal and breath sounds normal. No stridor. No respiratory distress. She has no wheezes. She has no rales. She exhibits no tenderness.  Abdominal: Soft. Bowel sounds are normal. She exhibits no distension and no mass. There is no tenderness. There is no rebound and no guarding.  Musculoskeletal: Normal range of motion. She exhibits no edema and no tenderness.  Lymphadenopathy:    She has no cervical adenopathy.  Neurological: She is oriented to person, place, and time.  Skin: Skin is warm, dry and intact. Rash noted. No abrasion, no burn, no ecchymosis and no purpura noted. Rash is papular. Rash is not macular, not maculopapular, not nodular, not pustular, not vesicular and not urticarial. She is not diaphoretic. There is erythema. No cyanosis. Nails show no clubbing.       She has both hands and both feet disease with erythematous papules, scale,fissuring  Psychiatric: She has a normal mood and affect. Her behavior is normal. Judgment and thought content normal.     Lab  Results  Component Value Date   WBC 3.1* 10/23/2008   HGB 12.9 06/23/2011   HCT 38.0 06/23/2011   PLT 195 10/23/2008   GLUCOSE 92 06/23/2011   CHOL 175 07/25/2008   TRIG 153* 07/25/2008   HDL 52 07/25/2008   LDLCALC 92 07/25/2008   ALT 13 07/25/2008   AST 15 07/25/2008   NA 141 06/23/2011   K 3.9 06/23/2011   CL 104 06/23/2011   CREATININE 1.20* 06/23/2011   BUN 14 06/23/2011   CO2 25 01/11/2008   TSH 50.089* 09/16/2011   INR 1.0 06/29/2007       Assessment & Plan:

## 2012-01-04 NOTE — Assessment & Plan Note (Signed)
Restart synthroid and check her TSH today

## 2012-03-09 ENCOUNTER — Other Ambulatory Visit (INDEPENDENT_AMBULATORY_CARE_PROVIDER_SITE_OTHER): Payer: 59

## 2012-03-09 ENCOUNTER — Encounter: Payer: Self-pay | Admitting: Internal Medicine

## 2012-03-09 ENCOUNTER — Ambulatory Visit (INDEPENDENT_AMBULATORY_CARE_PROVIDER_SITE_OTHER): Payer: 59 | Admitting: Internal Medicine

## 2012-03-09 VITALS — BP 126/62 | HR 60 | Temp 98.5°F | Resp 16 | Ht 67.0 in | Wt 275.5 lb

## 2012-03-09 DIAGNOSIS — E039 Hypothyroidism, unspecified: Secondary | ICD-10-CM

## 2012-03-09 DIAGNOSIS — L301 Dyshidrosis [pompholyx]: Secondary | ICD-10-CM

## 2012-03-09 DIAGNOSIS — F329 Major depressive disorder, single episode, unspecified: Secondary | ICD-10-CM

## 2012-03-09 DIAGNOSIS — R7309 Other abnormal glucose: Secondary | ICD-10-CM

## 2012-03-09 LAB — TSH: TSH: 21.04 u[IU]/mL — ABNORMAL HIGH (ref 0.35–5.50)

## 2012-03-09 LAB — BASIC METABOLIC PANEL
BUN: 17 mg/dL (ref 6–23)
Chloride: 104 mEq/L (ref 96–112)
GFR: 70.28 mL/min (ref >60.00)
Potassium: 4.2 mEq/L (ref 3.5–5.1)
Sodium: 140 mEq/L (ref 135–145)

## 2012-03-09 MED ORDER — TRAZODONE HCL 50 MG PO TABS: 50.0000 mg | ORAL_TABLET | Freq: Every day | ORAL | Status: DC

## 2012-03-09 MED ORDER — CITALOPRAM HYDROBROMIDE 20 MG PO TABS: 20.0000 mg | ORAL_TABLET | Freq: Every day | ORAL | Status: DC

## 2012-03-09 MED ORDER — FLUOCINONIDE-E 0.05 % EX CREA: TOPICAL_CREAM | Freq: Two times a day (BID) | CUTANEOUS | Status: DC

## 2012-03-09 MED ORDER — LEVOTHYROXINE SODIUM 75 MCG PO TABS: 75.0000 ug | ORAL_TABLET | Freq: Every day | ORAL | Status: DC

## 2012-03-09 NOTE — Patient Instructions (Signed)

## 2012-03-09 NOTE — Progress Notes (Signed)
Subjective:    Patient ID: Bethany Beard, female    DOB: 1960-05-22, 51 y.o.   MRN: [id number]  Rash This is a recurrent problem. The current episode started more than 1 month ago. The problem is unchanged. The affected locations include the left foot and right foot. The rash is characterized by dryness, itchiness, redness and scaling. She was exposed to nothing. Associated symptoms include fatigue. Pertinent negatives include no anorexia, congestion, cough, diarrhea, eye pain, facial edema, fever, joint pain, nail changes, rhinorrhea, shortness of breath, sore throat or vomiting. Treatments tried: lorpox cream. The treatment provided no relief. Her past medical history is significant for eczema.  Thyroid Problem Presents for follow-up visit. Symptoms include fatigue and weight gain. Patient reports no anxiety, cold intolerance, constipation, depressed mood, diaphoresis, diarrhea, dry skin, hair loss, heat intolerance, hoarse voice, leg swelling, menstrual problem, nail problem, palpitations, tremors, visual change or weight loss. The symptoms have been stable.      Review of Systems  Constitutional: Positive for weight gain and fatigue. Negative for fever, chills, weight loss, diaphoresis, activity change, appetite change and unexpected weight change.  HENT: Negative.  Negative for congestion, sore throat, hoarse voice and rhinorrhea.   Eyes: Negative.  Negative for pain.  Respiratory: Negative for cough, choking, chest tightness, shortness of breath, wheezing and stridor.   Cardiovascular: Negative for chest pain, palpitations and leg swelling.  Gastrointestinal: Negative for nausea, vomiting, abdominal pain, diarrhea, constipation and anorexia.  Genitourinary: Negative.  Negative for menstrual problem.  Musculoskeletal: Negative for myalgias, back pain, joint pain, joint swelling, arthralgias and gait problem.  Skin: Positive for rash. Negative for nail changes, color change, pallor and  wound.  Neurological: Negative.  Negative for tremors.  Hematological: Negative for cold intolerance, heat intolerance and adenopathy. Does not bruise/bleed easily.  Psychiatric/Behavioral: Negative.        Objective:   Physical Exam  Vitals reviewed. Constitutional: She is oriented to person, place, and time. She appears well-developed and well-nourished. No distress.  HENT:  Head: Normocephalic and atraumatic.  Mouth/Throat: Oropharynx is clear and moist. No oropharyngeal exudate.  Eyes: Conjunctivae normal are normal. Right eye exhibits no discharge. Left eye exhibits no discharge. No scleral icterus.  Neck: Normal range of motion. Neck supple. No JVD present. No tracheal deviation present. No thyromegaly present.  Cardiovascular: Normal rate, regular rhythm and intact distal pulses.  Exam reveals no gallop and no friction rub.   No murmur heard. Pulmonary/Chest: Effort normal and breath sounds normal. No stridor. No respiratory distress. She has no wheezes. She has no rales. She exhibits no tenderness.  Abdominal: Soft. Bowel sounds are normal. She exhibits no distension and no mass. There is no tenderness. There is no rebound and no guarding.  Musculoskeletal: Normal range of motion. She exhibits no edema and no tenderness.       Feet:  Lymphadenopathy:    She has no cervical adenopathy.  Neurological: She is oriented to person, place, and time.  Skin: Skin is warm and dry. Rash noted. No abrasion, no bruising, no burn, no ecchymosis, no laceration, no lesion, no petechiae and no purpura noted. Rash is papular. Rash is not macular, not maculopapular, not nodular, not pustular, not vesicular and not urticarial. She is not diaphoretic. There is erythema. No pallor.       On the dorsum of both of her feet there is a confluence of papules with erythema and scale, there is no exudate or swelling  Psychiatric: She has a normal  mood and affect. Her behavior is normal. Judgment and thought  content normal.      Lab Results  Component Value Date   WBC 3.5* 01/04/2012   HGB 12.4 01/04/2012   HCT 37.9 01/04/2012   PLT 202.0 01/04/2012   GLUCOSE 109* 01/04/2012   CHOL 311* 01/04/2012   TRIG 124.0 01/04/2012   HDL 70.40 01/04/2012   LDLDIRECT 207.2 01/04/2012   LDLCALC 92 07/25/2008   ALT 20 01/04/2012   AST 27 01/04/2012   NA 140 01/04/2012   K 3.9 01/04/2012   CL 105 01/04/2012   CREATININE 1.2 01/04/2012   BUN 19 01/04/2012   CO2 29 01/04/2012   TSH 48.59* 01/04/2012   INR 1.0 06/29/2007      Assessment & Plan:

## 2012-03-10 ENCOUNTER — Encounter: Payer: Self-pay | Admitting: Internal Medicine

## 2012-03-10 NOTE — Assessment & Plan Note (Signed)
Stop loprox and start topical steroids

## 2012-03-10 NOTE — Assessment & Plan Note (Signed)
Her TSH is still high so I have increased the dose of her synthroid

## 2012-03-10 NOTE — Assessment & Plan Note (Signed)
I will check her a1c to see if she has developed DM II 

## 2012-04-16 ENCOUNTER — Emergency Department (HOSPITAL_COMMUNITY): Payer: 59

## 2012-04-16 ENCOUNTER — Emergency Department (HOSPITAL_COMMUNITY)
Admission: EM | Admit: 2012-04-16 | Discharge: 2012-04-16 | Disposition: A | Discharge status: Home / Self Care | Payer: 59 | Attending: Emergency Medicine | Admitting: Emergency Medicine

## 2012-04-16 ENCOUNTER — Encounter (HOSPITAL_COMMUNITY): Payer: Self-pay | Admitting: *Deleted

## 2012-04-16 DIAGNOSIS — J45909 Unspecified asthma, uncomplicated: Secondary | ICD-10-CM | POA: Insufficient documentation

## 2012-04-16 DIAGNOSIS — F329 Major depressive disorder, single episode, unspecified: Secondary | ICD-10-CM | POA: Insufficient documentation

## 2012-04-16 DIAGNOSIS — G43909 Migraine, unspecified, not intractable, without status migrainosus: Secondary | ICD-10-CM | POA: Insufficient documentation

## 2012-04-16 DIAGNOSIS — F3289 Other specified depressive episodes: Secondary | ICD-10-CM | POA: Insufficient documentation

## 2012-04-16 DIAGNOSIS — Z79899 Other long term (current) drug therapy: Secondary | ICD-10-CM | POA: Insufficient documentation

## 2012-04-16 DIAGNOSIS — J329 Chronic sinusitis, unspecified: Secondary | ICD-10-CM | POA: Insufficient documentation

## 2012-04-16 DIAGNOSIS — E079 Disorder of thyroid, unspecified: Secondary | ICD-10-CM | POA: Insufficient documentation

## 2012-04-16 MED ORDER — ALBUTEROL SULFATE HFA 108 (90 BASE) MCG/ACT IN AERS
2.0000 | INHALATION_SPRAY | RESPIRATORY_TRACT | Status: DC
  Administered 2012-04-16: 2 via RESPIRATORY_TRACT
  Filled 2012-04-16: qty 6.7

## 2012-04-16 MED ORDER — ALBUTEROL SULFATE (5 MG/ML) 0.5% IN NEBU
2.5000 mg | INHALATION_SOLUTION | Freq: Once | RESPIRATORY_TRACT | Status: AC
  Administered 2012-04-16: 2.5 mg via RESPIRATORY_TRACT
  Filled 2012-04-16: qty 0.5

## 2012-04-16 MED ORDER — AMOXICILLIN-POT CLAVULANATE 875-125 MG PO TABS: 1.0000 | ORAL_TABLET | Freq: Two times a day (BID) | ORAL | Status: DC

## 2012-04-16 NOTE — ED Notes (Signed)
Pt escorted to discharge window. Pt verbalized understanding discharge instructions. In no acute distress.  

## 2012-04-16 NOTE — ED Provider Notes (Signed)
History     CSN: [id number]  Arrival date & time 04/16/12  1114   First MD Initiated Contact with Patient 04/16/12 1254      Chief Complaint  Patient presents with  . Influenza    (Consider location/radiation/quality/duration/timing/severity/associated sxs/prior treatment) HPI Comments: This is a 51 year old female, who presents emergency department with chief complaints of "the flu." The patient states that she's been sick for the past 2 weeks. She endorses sinus pressure, sinus congestion, runny nose, sore throat, and cough. She has not taken anything to alleviate her symptoms. There no aggravating or alleviating factors. Her discomfort is moderate in severity. Her symptoms have been persistent.  The history is provided by the patient. No language interpreter was used.    Past Medical History  Diagnosis Date  . Thyroid disease   . Asthma   . Depression   . Migraines     Past Surgical History  Procedure Date  . Cholecystectomy   . Back surgery   . Carpel   . Tonsillectomy     Family History  Problem Relation Age of Onset  . Adopted: Yes  . Cancer Neg Hx   . Diabetes Neg Hx   . Hyperlipidemia Neg Hx   . Hypertension Neg Hx   . Kidney disease Neg Hx   . Stroke Neg Hx     History  Substance Use Topics  . Smoking status: Current Every Day Smoker  . Smokeless tobacco: Never Used  . Alcohol Use: No     Comment: somtimes    OB History    Grav Para Term Preterm Abortions TAB SAB Ect Mult Living                  Review of Systems  All other systems reviewed and are negative.    Allergies  Aspirin and Codeine  Home Medications   Current Outpatient Rx  Name  Route  Sig  Dispense  Refill  . CITALOPRAM HYDROBROMIDE 20 MG PO TABS   Oral   Take 1 tablet (20 mg total) by mouth daily.   90 tablet   3   . FLUOCINONIDE-E 0.05 % EX CREA   Topical   Apply topically 2 (two) times daily.   60 g   1   . LEVOTHYROXINE SODIUM 75 MCG PO TABS   Oral   Take  1 tablet (75 mcg total) by mouth daily.   90 tablet   1   . NAPROXEN SODIUM 220 MG PO TABS   Oral   Take 440 mg by mouth 2 (two) times daily as needed. For pain.         Marland Kitchen OMEPRAZOLE 20 MG PO CPDR   Oral   Take 20 mg by mouth daily.         . TRAZODONE HCL 50 MG PO TABS   Oral   Take 1 tablet (50 mg total) by mouth at bedtime.   90 tablet   3     BP 154/73  Pulse 67  Temp 98.5 F (36.9 C) (Oral)  Resp 18  SpO2 95%  Physical Exam  Nursing note and vitals reviewed. Constitutional: She is oriented to person, place, and time. She appears well-developed and well-nourished.  HENT:  Head: Normocephalic and atraumatic.  Right Ear: External ear normal.  Left Ear: External ear normal.  Mouth/Throat: Oropharynx is clear and moist. No oropharyngeal exudate.       Swollen, erythematous turbinates, maxillary sinuses tender to palpation  Eyes:  Conjunctivae normal and EOM are normal. Pupils are equal, round, and reactive to light.  Neck: Normal range of motion. Neck supple.  Cardiovascular: Normal rate, regular rhythm and normal heart sounds.   Pulmonary/Chest: Effort normal. No respiratory distress. She has wheezes. She has no rales. She exhibits no tenderness.       Bilateral diffuse wheezes  Abdominal: Soft. Bowel sounds are normal.  Musculoskeletal: Normal range of motion.  Neurological: She is alert and oriented to person, place, and time.  Skin: Skin is warm and dry.  Psychiatric: She has a normal mood and affect. Her behavior is normal. Judgment and thought content normal.    ED Course  Procedures (including critical care time)  Results for orders placed in visit on 03/09/12  TSH      Component Value Range   TSH 21.04 (*) 0.35 - 5.50 uIU/mL  HEMOGLOBIN A1C      Component Value Range   Hemoglobin A1C 5.8  4.6 - 6.5 %  BASIC METABOLIC PANEL      Component Value Range   Sodium 140  135 - 145 mEq/L   Potassium 4.2  3.5 - 5.1 mEq/L   Chloride 104  96 - 112 mEq/L    CO2 30  19 - 32 mEq/L   Glucose, Bld 90  70 - 99 mg/dL   BUN 17  6 - 23 mg/dL   Creatinine, Ser 0.9  0.4 - 1.2 mg/dL   Calcium 9.1  8.4 - 16.1 mg/dL   GFR 09.60  >45.40 mL/min   Dg Chest 2 View  04/16/2012  *RADIOLOGY REPORT*  Clinical Data: Shortness of breath with productive cough.  CHEST - 2 VIEW  Comparison: 01/30/2008  Findings: The lungs are clear without focal consolidation, edema, effusion or pneumothorax.  Cardiopericardial silhouette is within normal limits for size.  Imaged bony structures of the thorax are intact.  IMPRESSION: Normal exam.   Original Report Authenticated By: Kennith Center, M.D.       1. Sinusitis       MDM  51 year old female with sinusitis. Patient also had some wheezing. I will give the patient a nebulizer and obtain chest x-ray.  3:30 PM Patient's wheezing improved after nebulizer treatment. I will discharge the patient with MDI, as well as Augmentin for sinusitis. Also recommended symptomatic relief with Sudafed. I will have the patient followup with her primary care provider on Monday. Patient understands and agrees with the plan. She is stable and ready for discharge.       Roxy Horseman, PA-C 04/16/12 1531

## 2012-04-16 NOTE — ED Notes (Signed)
Pt alert and oriented x4. Respirations even and unlabored, bilateral symmetrical rise and fall of chest. Skin warm and dry. In no acute distress. Denies needs.   

## 2012-04-16 NOTE — ED Notes (Signed)
Pt reports headache, non productive cough, and "crackling when breathing" x1 week. Headache 8/10 pain. Pt reports she has lots of allergy problems. Reports gets slightly sob when coughing.

## 2012-04-16 NOTE — ED Provider Notes (Signed)
Medical screening examination/treatment/procedure(s) were performed by non-physician practitioner and as supervising physician I was immediately available for consultation/collaboration.   Linh Johannes Y. Roland Lipke, MD 04/16/12 1531 

## 2012-04-18 ENCOUNTER — Other Ambulatory Visit (INDEPENDENT_AMBULATORY_CARE_PROVIDER_SITE_OTHER): Payer: 59

## 2012-04-18 ENCOUNTER — Encounter: Payer: Self-pay | Admitting: Internal Medicine

## 2012-04-18 ENCOUNTER — Ambulatory Visit (INDEPENDENT_AMBULATORY_CARE_PROVIDER_SITE_OTHER): Payer: 59 | Admitting: Internal Medicine

## 2012-04-18 VITALS — BP 118/72 | HR 67 | Temp 97.7°F | Resp 16 | Wt 274.0 lb

## 2012-04-18 DIAGNOSIS — E039 Hypothyroidism, unspecified: Secondary | ICD-10-CM

## 2012-04-18 LAB — TSH: TSH: 17.28 u[IU]/mL — ABNORMAL HIGH (ref 0.35–5.50)

## 2012-04-18 NOTE — Progress Notes (Signed)
  Subjective:    Patient ID: Bethany Beard, female    DOB: 1960/10/10, 51 y.o.   MRN: [id number]  Thyroid Problem Presents for follow-up visit. Symptoms include constipation, diarrhea and fatigue. Patient reports no anxiety, cold intolerance, depressed mood, diaphoresis, dry skin, hair loss, heat intolerance, hoarse voice, leg swelling, menstrual problem, nail problem, palpitations, tremors, visual change, weight gain or weight loss. The symptoms have been stable.      Review of Systems  Constitutional: Positive for fatigue. Negative for fever, chills, weight loss, weight gain, diaphoresis, activity change, appetite change and unexpected weight change.  HENT: Negative.  Negative for hoarse voice.   Eyes: Negative.   Respiratory: Negative.   Cardiovascular: Negative.  Negative for palpitations.  Gastrointestinal: Positive for diarrhea and constipation. Negative for nausea, vomiting, abdominal pain, blood in stool, anal bleeding and rectal pain.  Genitourinary: Negative.  Negative for menstrual problem.  Musculoskeletal: Negative.   Skin: Negative.   Neurological: Negative.  Negative for tremors.  Hematological: Negative for cold intolerance, heat intolerance and adenopathy. Does not bruise/bleed easily.  Psychiatric/Behavioral: Negative.        Objective:   Physical Exam  Vitals reviewed. Constitutional: She is oriented to person, place, and time. She appears well-developed and well-nourished.  Non-toxic appearance. She does not have a sickly appearance. She does not appear ill. No distress.  HENT:  Head: Normocephalic and atraumatic.  Mouth/Throat: Oropharynx is clear and moist. No oropharyngeal exudate.  Eyes: Conjunctivae normal are normal. Right eye exhibits no discharge. Left eye exhibits no discharge. No scleral icterus.  Neck: Normal range of motion. Neck supple. No JVD present. No tracheal deviation present. No thyromegaly present.  Cardiovascular: Normal rate, regular rhythm,  normal heart sounds and intact distal pulses.  Exam reveals no gallop and no friction rub.   No murmur heard. Pulmonary/Chest: Effort normal and breath sounds normal. No stridor. No respiratory distress. She has no wheezes. She has no rales. She exhibits no tenderness.  Abdominal: Soft. Bowel sounds are normal. She exhibits no distension and no mass. There is no tenderness. There is no rebound and no guarding.  Musculoskeletal: Normal range of motion. She exhibits no edema and no tenderness.  Lymphadenopathy:    She has no cervical adenopathy.  Neurological: She is oriented to person, place, and time.  Skin: Skin is warm and dry. No rash noted. She is not diaphoretic. No erythema. No pallor.  Psychiatric: She has a normal mood and affect. Her behavior is normal. Judgment and thought content normal.     Lab Results  Component Value Date   WBC 3.5* 01/04/2012   HGB 12.4 01/04/2012   HCT 37.9 01/04/2012   PLT 202.0 01/04/2012   GLUCOSE 90 03/09/2012   CHOL 311* 01/04/2012   TRIG 124.0 01/04/2012   HDL 70.40 01/04/2012   LDLDIRECT 207.2 01/04/2012   LDLCALC 92 07/25/2008   ALT 20 01/04/2012   AST 27 01/04/2012   NA 140 03/09/2012   K 4.2 03/09/2012   CL 104 03/09/2012   CREATININE 0.9 03/09/2012   BUN 17 03/09/2012   CO2 30 03/09/2012   TSH 21.04* 03/09/2012   INR 1.0 06/29/2007   HGBA1C 5.8 03/09/2012       Assessment & Plan:

## 2012-04-18 NOTE — Assessment & Plan Note (Signed)
I will check her TSH and will adjust her dose if needed 

## 2012-04-18 NOTE — Patient Instructions (Signed)

## 2012-04-19 ENCOUNTER — Encounter: Payer: Self-pay | Admitting: Internal Medicine

## 2012-04-19 MED ORDER — LEVOTHYROXINE SODIUM 112 MCG PO TABS: 112.0000 ug | ORAL_TABLET | Freq: Every day | ORAL | Status: DC

## 2012-04-19 NOTE — Addendum Note (Signed)
Addended by: Etta Grandchild on: 04/19/2012 07:19 AM   Modules accepted: Orders

## 2012-07-15 ENCOUNTER — Ambulatory Visit: Payer: 59 | Admitting: Internal Medicine

## 2012-07-15 DIAGNOSIS — Z0289 Encounter for other administrative examinations: Secondary | ICD-10-CM

## 2014-09-02 ENCOUNTER — Other Ambulatory Visit: Payer: Self-pay | Admitting: Internal Medicine

## 2016-05-28 ENCOUNTER — Encounter (HOSPITAL_COMMUNITY): Payer: Self-pay | Admitting: Emergency Medicine

## 2016-05-28 ENCOUNTER — Emergency Department (HOSPITAL_COMMUNITY): Payer: Self-pay

## 2016-05-28 ENCOUNTER — Emergency Department (HOSPITAL_COMMUNITY)
Admission: EM | Admit: 2016-05-28 | Discharge: 2016-05-28 | Disposition: A | Discharge status: Home / Self Care | Payer: Self-pay | Attending: Emergency Medicine | Admitting: Emergency Medicine

## 2016-05-28 DIAGNOSIS — E039 Hypothyroidism, unspecified: Secondary | ICD-10-CM | POA: Insufficient documentation

## 2016-05-28 DIAGNOSIS — R102 Pelvic and perineal pain: Secondary | ICD-10-CM | POA: Insufficient documentation

## 2016-05-28 DIAGNOSIS — R109 Unspecified abdominal pain: Secondary | ICD-10-CM

## 2016-05-28 DIAGNOSIS — J45909 Unspecified asthma, uncomplicated: Secondary | ICD-10-CM | POA: Insufficient documentation

## 2016-05-28 DIAGNOSIS — Z79899 Other long term (current) drug therapy: Secondary | ICD-10-CM | POA: Insufficient documentation

## 2016-05-28 DIAGNOSIS — F17200 Nicotine dependence, unspecified, uncomplicated: Secondary | ICD-10-CM | POA: Insufficient documentation

## 2016-05-28 LAB — COMPREHENSIVE METABOLIC PANEL
ALBUMIN: 3.8 g/dL (ref 3.5–5.0)
ALK PHOS: 57 U/L (ref 38–126)
ALT: 8 U/L — ABNORMAL LOW (ref 14–54)
ANION GAP: 11 (ref 5–15)
AST: 17 U/L (ref 15–41)
BUN: 9 mg/dL (ref 6–20)
CALCIUM: 9.4 mg/dL (ref 8.9–10.3)
CO2: 27 mmol/L (ref 22–32)
Chloride: 102 mmol/L (ref 101–111)
Creatinine, Ser: 0.96 mg/dL (ref 0.44–1.00)
GFR calc non Af Amer: >60 mL/min (ref >60)
GLUCOSE: 103 mg/dL — AB (ref 65–99)
POTASSIUM: 4.3 mmol/L (ref 3.5–5.1)
SODIUM: 140 mmol/L (ref 135–145)
Total Bilirubin: 0.5 mg/dL (ref 0.3–1.2)
Total Protein: 7 g/dL (ref 6.5–8.1)

## 2016-05-28 LAB — CBC
HEMATOCRIT: 40.7 % (ref 36.0–46.0)
HEMOGLOBIN: 13.1 g/dL (ref 12.0–15.0)
MCH: 30.9 pg (ref 26.0–34.0)
MCHC: 32.2 g/dL (ref 30.0–36.0)
MCV: 96 fL (ref 78.0–100.0)
Platelets: 210 10*3/uL (ref 150–400)
RBC: 4.24 MIL/uL (ref 3.87–5.11)
RDW: 15.3 % (ref 11.5–15.5)
WBC: 5.2 10*3/uL (ref 4.0–10.5)

## 2016-05-28 LAB — URINALYSIS, ROUTINE W REFLEX MICROSCOPIC
Bilirubin Urine: NEGATIVE
Glucose, UA: NEGATIVE mg/dL
Hgb urine dipstick: NEGATIVE
Ketones, ur: NEGATIVE mg/dL
Leukocytes, UA: NEGATIVE
NITRITE: NEGATIVE
PH: 5 (ref 5.0–8.0)
Protein, ur: NEGATIVE mg/dL

## 2016-05-28 LAB — LIPASE, BLOOD: Lipase: 18 U/L (ref 11–51)

## 2016-05-28 MED ORDER — CYCLOBENZAPRINE HCL 10 MG PO TABS: 10.0000 mg | ORAL_TABLET | Freq: Three times a day (TID) | ORAL | 0 refills | Status: DC | PRN

## 2016-05-28 MED ORDER — HYDROCODONE-ACETAMINOPHEN 5-325 MG PO TABS
2.0000 | ORAL_TABLET | Freq: Once | ORAL | Status: AC
  Administered 2016-05-28: 2 via ORAL
  Filled 2016-05-28: qty 2

## 2016-05-28 MED ORDER — HYDROCODONE-ACETAMINOPHEN 5-325 MG PO TABS: 1.0000 | ORAL_TABLET | ORAL | 0 refills | Status: DC | PRN

## 2016-05-28 MED ORDER — IOPAMIDOL (ISOVUE-300) INJECTION 61%
INTRAVENOUS | Status: AC
  Administered 2016-05-28: 100 mL
  Filled 2016-05-28: qty 100

## 2016-05-28 MED ORDER — SODIUM CHLORIDE 0.9 % IV BOLUS (SEPSIS)
1000.0000 mL | Freq: Once | INTRAVENOUS | Status: AC
  Administered 2016-05-28: 1000 mL via INTRAVENOUS

## 2016-05-28 MED ORDER — ONDANSETRON HCL 4 MG/2ML IJ SOLN
4.0000 mg | Freq: Once | INTRAMUSCULAR | Status: AC
  Administered 2016-05-28: 4 mg via INTRAVENOUS
  Filled 2016-05-28: qty 2

## 2016-05-28 MED ORDER — HYDROMORPHONE HCL 2 MG/ML IJ SOLN
1.0000 mg | Freq: Once | INTRAMUSCULAR | Status: AC
  Administered 2016-05-28: 1 mg via INTRAVENOUS
  Filled 2016-05-28: qty 1

## 2016-05-28 MED ORDER — CYCLOBENZAPRINE HCL 10 MG PO TABS
10.0000 mg | ORAL_TABLET | Freq: Once | ORAL | Status: AC
  Administered 2016-05-28: 10 mg via ORAL
  Filled 2016-05-28: qty 1

## 2016-05-28 NOTE — ED Notes (Signed)
Pt transported to XR.  

## 2016-05-28 NOTE — ED Triage Notes (Signed)
Pt sts right sided flank pain intermittently x 1 month

## 2016-05-28 NOTE — ED Provider Notes (Signed)
[facility]EMERGENCY DEPT Provider Note   CSN: [id number] Arrival date & time: 05/28/16  1350     History   Chief Complaint Chief Complaint  Patient presents with  . Flank Pain    HPI Bethany Beard is a 56 y.o. female.  HPI 55 yo F with PMHx as below wo p/w RLQ pain. Pt states that over the past 2 months she has had intermittent sharp, stabbing RLQ pain. It comes and goes over days. Over the past week, she had gradual onset of progressively worsening achy, but also sharp, RLQ pain with associated nausea. She has had no dysuria, vaginal bleeding, or discharge. No change in bowel habits. No vomiting but has had some nausea. Pain is similar in location but more severe than her previous pain. She is s/p cholecystectomy.  Past Medical History:  Diagnosis Date  . Asthma   . Depression   . Migraines   . Thyroid disease     Patient Active Problem List   Diagnosis Date Noted  . Other abnormal glucose 03/09/2012  . Eczema, dyshidrotic 01/04/2012  . ANA POSITIVE 09/05/2008  . HYPERCHOLESTEROLEMIA 04/16/2008  . MIGRAINE HEADACHE 01/31/2008  . GERD 01/31/2008  . HYPOTHYROIDISM 01/11/2008  . OBESITY 01/11/2008  . TOBACCO ABUSE 01/11/2008  . ALLERGIC RHINITIS 01/11/2008  . ASTHMA 01/11/2008  . LOW BACK PAIN, CHRONIC 01/11/2008  . DEPRESSION 04/20/2005    Past Surgical History:  Procedure Laterality Date  . BACK SURGERY    . carpel    . CHOLECYSTECTOMY    . TONSILLECTOMY      OB History    No data available       Home Medications    Prior to Admission medications   Medication Sig Start Date End Date Taking? Authorizing Provider  naproxen sodium (ANAPROX) 220 MG tablet Take 440 mg by mouth 2 (two) times daily as needed. For pain.   Yes Historical Provider, MD  omeprazole (PRILOSEC) 20 MG capsule Take 20 mg by mouth daily.   Yes Historical Provider, MD  amoxicillin-clavulanate (AUGMENTIN) 875-125 MG per tablet Take 1 tablet by mouth every 12 (twelve) hours. Patient not  taking: Reported on 05/28/2016 04/16/12   Roxy Horseman, PA-C  citalopram (CELEXA) 20 MG tablet Take 1 tablet (20 mg total) by mouth daily. Patient not taking: Reported on 05/28/2016 03/09/12   Etta Grandchild, MD  cyclobenzaprine (FLEXERIL) 10 MG tablet Take 1 tablet (10 mg total) by mouth 3 (three) times daily as needed for muscle spasms. 05/28/16   Shaune Pollack, MD  fluocinonide-emollient (LIDEX-E) 0.05 % cream Apply topically 2 (two) times daily. Patient not taking: Reported on 05/28/2016 03/09/12   Etta Grandchild, MD  HYDROcodone-acetaminophen (NORCO/VICODIN) 5-325 MG tablet Take 1-2 tablets by mouth every 4 (four) hours as needed for moderate pain or severe pain. 05/28/16   Shaune Pollack, MD  levothyroxine (SYNTHROID, LEVOTHROID) 112 MCG tablet Take 1 tablet (112 mcg total) by mouth daily. Patient not taking: Reported on 05/28/2016 04/19/12   Etta Grandchild, MD  traZODone (DESYREL) 50 MG tablet Take 1 tablet (50 mg total) by mouth at bedtime. Patient not taking: Reported on 05/28/2016 03/09/12   Etta Grandchild, MD    Family History Family History  Problem Relation Age of Onset  . Adopted: Yes  . Cancer Neg Hx   . Diabetes Neg Hx   . Hyperlipidemia Neg Hx   . Hypertension Neg Hx   . Kidney disease Neg Hx   . Stroke Neg Hx  Social History Social History  Substance Use Topics  . Smoking status: Current Every Day Smoker  . Smokeless tobacco: Never Used  . Alcohol use No     Comment: somtimes     Allergies   Aspirin and Codeine   Review of Systems Review of Systems  Constitutional: Positive for fatigue. Negative for chills and fever.  HENT: Negative for congestion, rhinorrhea and sore throat.   Eyes: Negative for visual disturbance.  Respiratory: Negative for cough, shortness of breath and wheezing.   Cardiovascular: Negative for chest pain and leg swelling.  Gastrointestinal: Positive for abdominal pain and nausea. Negative for diarrhea and vomiting.  Genitourinary: Positive  for flank pain. Negative for dysuria, vaginal bleeding and vaginal discharge.  Musculoskeletal: Negative for neck pain.  Skin: Negative for rash.  Allergic/Immunologic: Negative for immunocompromised state.  Neurological: Negative for syncope and headaches.  Hematological: Does not bruise/bleed easily.  All other systems reviewed and are negative.    Physical Exam Updated Vital Signs BP 185/89   Pulse 62   Temp 98.5 F (36.9 C)   Resp 16   Ht 5\' 6"  (1.676 m)   Wt 280 lb (127 kg)   SpO2 (!) 89%   BMI 45.19 kg/m   Physical Exam  Constitutional: She is oriented to person, place, and time. She appears well-developed and well-nourished. No distress.  HENT:  Head: Normocephalic and atraumatic.  Eyes: Conjunctivae are normal.  Neck: Neck supple.  Cardiovascular: Normal rate, regular rhythm and normal heart sounds.  Exam reveals no friction rub.   No murmur heard. Pulmonary/Chest: Effort normal and breath sounds normal. No respiratory distress. She has no wheezes. She has no rales.  Abdominal: Soft. Normal appearance. She exhibits no distension. There is tenderness in the right lower quadrant. There is tenderness at McBurney's point. There is no rigidity, no rebound, no guarding and negative Murphy's sign.  Musculoskeletal: She exhibits no edema.  Neurological: She is alert and oriented to person, place, and time. She exhibits normal muscle tone.  Skin: Skin is warm. Capillary refill takes less than 2 seconds.  Psychiatric: She has a normal mood and affect.  Nursing note and vitals reviewed.    ED Treatments / Results  Labs (all labs ordered are listed, but only abnormal results are displayed) Labs Reviewed  COMPREHENSIVE METABOLIC PANEL - Abnormal; Notable for the following:       Result Value   Glucose, Bld 103 (*)    ALT 8 (*)    All other components within normal limits  URINALYSIS, ROUTINE W REFLEX MICROSCOPIC - Abnormal; Notable for the following:    Specific Gravity,  Urine >1.046 (*)    All other components within normal limits  LIPASE, BLOOD  CBC    EKG  EKG Interpretation None       Radiology US Transvaginal Non-ob  Result Date: 05/28/2016 CLINICAL DATA:  Patient with pelvic pain and right-sided flank pain. EXAM: TRANSABDOMINAL AND TRANSVAGINAL ULTRASOUND OF PELVIS DOPPLER ULTRASOUND OF OVARIES TECHNIQUE: Both transabdominal and transvaginal ultrasound examinations of the pelvis were performed. Transabdominal technique was performed for global imaging of the pelvis including uterus, ovaries, adnexal regions, and pelvic cul-de-sac. It was necessary to proceed with endovaginal exam following the transabdominal exam to visualize the endometrium and adnexal structures. Color and duplex Doppler ultrasound was utilized to evaluate blood flow to the ovaries. COMPARISON:  CT abdomen pelvis earlier same day. FINDINGS: Uterus Measurements: 7.5 x 4.2 x 4.6 cm. There is a 1.6 x 1.3 x  1.5 cm intramural fibroid within the left aspect of the uterine body. Endometrium Given body habitus, the endometrium is difficult to visualize and accurately measure. Right ovary Measurements: 2.6 x 1.8 x 2.3 cm. Normal appearance/no adnexal mass. Left ovary Measurements: 2.3 x 1.8 x 1.7 cm. Normal appearance/no adnexal mass. Pulsed Doppler evaluation of both ovaries demonstrates normal low-resistance arterial and venous waveforms. Other findings No abnormal free fluid. IMPRESSION: Normal sonographic appearance of the ovaries of laterally. No sonographic evidence for torsion. The endometrium is difficult to visualize given body habitus. Recommend follow-up pelvic ultrasound 3 months to reassess the endometrium and exclude the possibility of postmenopausal endometrial thickening. Electronically Signed   By: Annia Belt M.D.   On: 05/28/2016 20:20   US Pelvis Complete  Result Date: 05/28/2016 CLINICAL DATA:  Patient with pelvic pain and right-sided flank pain. EXAM: TRANSABDOMINAL AND  TRANSVAGINAL ULTRASOUND OF PELVIS DOPPLER ULTRASOUND OF OVARIES TECHNIQUE: Both transabdominal and transvaginal ultrasound examinations of the pelvis were performed. Transabdominal technique was performed for global imaging of the pelvis including uterus, ovaries, adnexal regions, and pelvic cul-de-sac. It was necessary to proceed with endovaginal exam following the transabdominal exam to visualize the endometrium and adnexal structures. Color and duplex Doppler ultrasound was utilized to evaluate blood flow to the ovaries. COMPARISON:  CT abdomen pelvis earlier same day. FINDINGS: Uterus Measurements: 7.5 x 4.2 x 4.6 cm. There is a 1.6 x 1.3 x 1.5 cm intramural fibroid within the left aspect of the uterine body. Endometrium Given body habitus, the endometrium is difficult to visualize and accurately measure. Right ovary Measurements: 2.6 x 1.8 x 2.3 cm. Normal appearance/no adnexal mass. Left ovary Measurements: 2.3 x 1.8 x 1.7 cm. Normal appearance/no adnexal mass. Pulsed Doppler evaluation of both ovaries demonstrates normal low-resistance arterial and venous waveforms. Other findings No abnormal free fluid. IMPRESSION: Normal sonographic appearance of the ovaries of laterally. No sonographic evidence for torsion. The endometrium is difficult to visualize given body habitus. Recommend follow-up pelvic ultrasound 3 months to reassess the endometrium and exclude the possibility of postmenopausal endometrial thickening. Electronically Signed   By: Annia Belt M.D.   On: 05/28/2016 20:20   Ct Abdomen Pelvis W Contrast  Result Date: 05/28/2016 CLINICAL DATA:  RIGHT flank pain for 1 month. History of cholecystectomy. EXAM: CT ABDOMEN AND PELVIS WITH CONTRAST TECHNIQUE: Multidetector CT imaging of the abdomen and pelvis was performed using the standard protocol following bolus administration of intravenous contrast. CONTRAST:  ISOVUE-300 IOPAMIDOL (ISOVUE-300) INJECTION 61% COMPARISON:  CT abdomen and pelvis  July 02, 2007 FINDINGS: LOWER CHEST: Lung bases are clear. Included heart size is normal. No pericardial effusion. Mildly gas distended distal esophagus associated with reflux. HEPATOBILIARY: Status post cholecystectomy.  Liver is normal. PANCREAS: Normal. SPLEEN: Normal. ADRENALS/URINARY TRACT: Kidneys are orthotopic, demonstrating symmetric enhancement. No nephrolithiasis, hydronephrosis or solid renal masses. Too small to characterize hypodensity lower pole LEFT kidney. The unopacified ureters are normal in course and caliber. Delayed imaging through the kidneys demonstrates symmetric prompt contrast excretion within the proximal urinary collecting system. Urinary bladder is partially distended and unremarkable. Normal adrenal glands. STOMACH/BOWEL: Small hiatal hernia. The stomach, small and large bowel are normal in course and caliber without inflammatory changes. Mild descending and sigmoid colon diverticulosis. Normal retrocecal appendix. VASCULAR/LYMPHATIC: Aortoiliac vessels are normal in course and caliber, moderate calcific atherosclerosis. No lymphadenopathy by CT size criteria. REPRODUCTIVE: Normal. OTHER: No intraperitoneal free fluid or free air. MUSCULOSKELETAL: Nonacute. Anterior abdominal wall scarring. Severe L4-5 and L5-S1 degenerative discs  similar to prior CT resulting in at least moderate canal stenosis L4-5. Severe L4-5 and L5-S1 neural foraminal narrowing. IMPRESSION: No acute intra-abdominal or pelvic process. No nephrolithiasis. Normal appendix. 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 Electronically Signed   By: Awilda Metroourtnay  Bloomer M.D.   On: 05/28/2016 18:25   [place] Art/ven Flow Abd Pelv Doppler  Result Date: 05/28/2016 CLINICAL DATA:  Patient with pelvic pain and right-sided flank pain. EXAM: TRANSABDOMINAL AND TRANSVAGINAL ULTRASOUND OF PELVIS DOPPLER ULTRASOUND OF OVARIES TECHNIQUE: Both  transabdominal and transvaginal ultrasound examinations of the pelvis were performed. Transabdominal technique was performed for global imaging of the pelvis including uterus, ovaries, adnexal regions, and pelvic cul-de-sac. It was necessary to proceed with endovaginal exam following the transabdominal exam to visualize the endometrium and adnexal structures. Color and duplex Doppler ultrasound was utilized to evaluate blood flow to the ovaries. COMPARISON:  CT abdomen pelvis earlier same day. FINDINGS: Uterus Measurements: 7.5 x 4.2 x 4.6 cm. There is a 1.6 x 1.3 x 1.5 cm intramural fibroid within the left aspect of the uterine body. Endometrium Given body habitus, the endometrium is difficult to visualize and accurately measure. Right ovary Measurements: 2.6 x 1.8 x 2.3 cm. Normal appearance/no adnexal mass. Left ovary Measurements: 2.3 x 1.8 x 1.7 cm. Normal appearance/no adnexal mass. Pulsed Doppler evaluation of both ovaries demonstrates normal low-resistance arterial and venous waveforms. Other findings No abnormal free fluid. IMPRESSION: Normal sonographic appearance of the ovaries of laterally. No sonographic evidence for torsion. The endometrium is difficult to visualize given body habitus. Recommend follow-up pelvic ultrasound 3 months to reassess the endometrium and exclude the possibility of postmenopausal endometrial thickening. Electronically Signed   By: Annia Beltrew  Davis M.D.   On: 05/28/2016 20:20    Procedures Procedures (including critical care time)  Medications Ordered in ED Medications  sodium chloride 0.9 % bolus 1,000 mL (0 mLs Intravenous Stopped 05/28/16 1815)  HYDROmorphone (DILAUDID) injection 1 mg (1 mg Intravenous Given 05/28/16 1716)  ondansetron (ZOFRAN) injection 4 mg (4 mg Intravenous Given 05/28/16 1716)  iopamidol (ISOVUE-300) 61 % injection (100 mLs  Contrast Given 05/28/16 1758)  HYDROcodone-acetaminophen (NORCO/VICODIN) 5-325 MG per tablet 2 tablet (2 tablets Oral Given 05/28/16  2147)  cyclobenzaprine (FLEXERIL) tablet 10 mg (10 mg Oral Given 05/28/16 2147)     Initial Impression / Assessment and Plan / ED Course  I have reviewed the triage vital signs and the nursing notes.  Pertinent labs & imaging results that were available during my care of the patient were reviewed by me and considered in my medical decision making (see chart for details).    56 yo obese female here with RLQ pain. Initial DDx includes: appendicitis, colitis, IBS, diverticulitis (atypical), less likely SBO. She is s/p chole. DDx also includes UTI, stone. No vaginal bleeding, discharge, or sx to suggest PID; will f/u CT imaging for evaluation of ovaries though history is less c/w torsion, TOA, or ovarian pathology. Will start fluids, pain control.  Labs, imaging overall reassuring. CBC without leukocytosis or anemia. CMP with normal LFTs and renal function Lipase wnl. UA without signs of UTI. CT A/P as well as pelvic U/S neg. Suspect possible radiculopathy, abd wall pain. Will treat, d/c with outpt f/u. No other red flags. VSS and pt is tolerating PO.  Final Clinical Impressions(s) / ED Diagnoses   Final diagnoses:  Pelvic pain  Flank pain    New Prescriptions Discharge  Medication List as of 05/28/2016 10:23 PM    START taking these medications   Details  cyclobenzaprine (FLEXERIL) 10 MG tablet Take 1 tablet (10 mg total) by mouth 3 (three) times daily as needed for muscle spasms., Starting Thu 05/28/2016, Print    HYDROcodone-acetaminophen (NORCO/VICODIN) 5-325 MG tablet Take 1-2 tablets by mouth every 4 (four) hours as needed for moderate pain or severe pain., Starting Thu 05/28/2016, Print         Shaune Pollack, MD 05/29/16 [phone]

## 2017-07-26 DIAGNOSIS — M25561 Pain in right knee: Secondary | ICD-10-CM | POA: Insufficient documentation

## 2017-07-26 DIAGNOSIS — M25562 Pain in left knee: Secondary | ICD-10-CM | POA: Insufficient documentation

## 2018-07-28 ENCOUNTER — Ambulatory Visit: Payer: Self-pay | Admitting: *Deleted

## 2018-07-28 NOTE — Telephone Encounter (Signed)
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  [Reason for Disposition . [1] COVID-19 EXPOSURE (Close Contact) within last 14 days AND [2] NO cough, fever, or breathing difficulty  Answer Assessment - Initial Assessment Questions 1. CLOSE CONTACT: "Who is the person with the confirmed or suspected COVID-19 infection that you were exposed to?"     Not sure 2. PLACE of CONTACT: "Where were you when you were exposed to COVID-19?" (e.g., home, school, medical waiting room; which city?)     [place], [place] 3. TYPE of CONTACT: "How much contact was there?" (e.g., sitting next to, live in same house, work in same office, same building)    Same bulitding 4. DURATION of CONTACT: "How long were you in contact with the COVID-19 patient?" (e.g., a few seconds, passed by person, a few minutes, live with the patient)    3-10 min 5. DATE of CONTACT: "When did you have contact with a COVID-19 patient?" (e.g., how many days ago)  multiple dates 6. TRAVEL: "Have you traveled out of the country recently?" If so, "When and where?"     * Also ask about out-of-state travel, since the CDC has identified some high risk cities for community spread in the [place].     * Note: Travel becomes less relevant if there is widespread community transmission where the patient lives.     no 7. COMMUNITY SPREAD: "Are there lots of cases or COVID-19 (community spread)  where you live?" (See public health department website, if unsure)   * MAJOR community spread: high number of cases; numbers of cases are increasing; many people hospitalized.   * MINOR community spread: low number of cases; not increasing; few or no people hospitalized     major 8. SYMPTOMS: "Do you have any symptoms?" (e.g., fever, cough, breathing difficulty)     ? Fever pt does not have a thermometer 9. PREGNANCY OR POSTPARTUM: "Is there any chance you are pregnant?" "When was your last menstrual period?" "Did you deliver in the last 2 weeks?"     No,.no 10. HIGH RISK: "Do you have any heart or lung problems? Do you have a weak immune system?" (e.g., CHF, COPD, asthma, HIV positive, chemotherapy, renal failure, diabetes mellitus, sickle cell anemia)       Smoker, asthma  Protocols used: CORONAVIRUS (COVID-19) EXPOSURE-A-AH

## 2018-07-29 ENCOUNTER — Other Ambulatory Visit: Payer: Self-pay

## 2018-07-29 ENCOUNTER — Emergency Department (HOSPITAL_COMMUNITY)
Admission: EM | Admit: 2018-07-29 | Discharge: 2018-07-29 | Disposition: A | Discharge status: Home / Self Care | Payer: Self-pay | Attending: Emergency Medicine | Admitting: Emergency Medicine

## 2018-07-29 ENCOUNTER — Emergency Department (HOSPITAL_COMMUNITY): Payer: Self-pay

## 2018-07-29 ENCOUNTER — Encounter (HOSPITAL_COMMUNITY): Payer: Self-pay

## 2018-07-29 DIAGNOSIS — K56600 Partial intestinal obstruction, unspecified as to cause: Secondary | ICD-10-CM | POA: Insufficient documentation

## 2018-07-29 DIAGNOSIS — R079 Chest pain, unspecified: Secondary | ICD-10-CM | POA: Insufficient documentation

## 2018-07-29 DIAGNOSIS — J45909 Unspecified asthma, uncomplicated: Secondary | ICD-10-CM | POA: Insufficient documentation

## 2018-07-29 DIAGNOSIS — Z79899 Other long term (current) drug therapy: Secondary | ICD-10-CM | POA: Insufficient documentation

## 2018-07-29 DIAGNOSIS — R05 Cough: Secondary | ICD-10-CM | POA: Insufficient documentation

## 2018-07-29 DIAGNOSIS — R51 Headache: Secondary | ICD-10-CM | POA: Insufficient documentation

## 2018-07-29 DIAGNOSIS — F17210 Nicotine dependence, cigarettes, uncomplicated: Secondary | ICD-10-CM | POA: Insufficient documentation

## 2018-07-29 DIAGNOSIS — E039 Hypothyroidism, unspecified: Secondary | ICD-10-CM | POA: Insufficient documentation

## 2018-07-29 DIAGNOSIS — R059 Cough, unspecified: Secondary | ICD-10-CM

## 2018-07-29 DIAGNOSIS — N390 Urinary tract infection, site not specified: Secondary | ICD-10-CM | POA: Insufficient documentation

## 2018-07-29 HISTORY — DX: Pneumonia, unspecified organism: J18.9

## 2018-07-29 LAB — COMPREHENSIVE METABOLIC PANEL
ALT: 16 U/L (ref 0–44)
AST: 20 U/L (ref 15–41)
Albumin: 4.1 g/dL (ref 3.5–5.0)
Alkaline Phosphatase: 59 U/L (ref 38–126)
Anion gap: 10 (ref 5–15)
BUN: 14 mg/dL (ref 6–20)
CO2: 27 mmol/L (ref 22–32)
Calcium: 9.1 mg/dL (ref 8.9–10.3)
Chloride: 102 mmol/L (ref 98–111)
Creatinine, Ser: 0.84 mg/dL (ref 0.44–1.00)
GFR calc Af Amer: >60 mL/min (ref >60)
GFR calc non Af Amer: >60 mL/min (ref >60)
Glucose, Bld: 113 mg/dL — ABNORMAL HIGH (ref 70–99)
Potassium: 3.7 mmol/L (ref 3.5–5.1)
Sodium: 139 mmol/L (ref 135–145)
Total Bilirubin: 0.7 mg/dL (ref 0.3–1.2)
Total Protein: 7.9 g/dL (ref 6.5–8.1)

## 2018-07-29 LAB — CBC WITH DIFFERENTIAL/PLATELET
Abs Immature Granulocytes: 0.02 10*3/uL (ref 0.00–0.07)
Basophils Absolute: 0 10*3/uL (ref 0.0–0.1)
Basophils Relative: 0 %
Eosinophils Absolute: 0.1 10*3/uL (ref 0.0–0.5)
Eosinophils Relative: 2 %
HCT: 38.9 % (ref 36.0–46.0)
Hemoglobin: 12.5 g/dL (ref 12.0–15.0)
Immature Granulocytes: 0 %
Lymphocytes Relative: 22 %
Lymphs Abs: 1.4 10*3/uL (ref 0.7–4.0)
MCH: 34.9 pg — ABNORMAL HIGH (ref 26.0–34.0)
MCHC: 32.1 g/dL (ref 30.0–36.0)
MCV: 108.7 fL — ABNORMAL HIGH (ref 80.0–100.0)
Monocytes Absolute: 0.4 10*3/uL (ref 0.1–1.0)
Monocytes Relative: 5 %
Neutro Abs: 4.5 10*3/uL (ref 1.7–7.7)
Neutrophils Relative %: 71 %
Platelets: 257 10*3/uL (ref 150–400)
RBC: 3.58 MIL/uL — ABNORMAL LOW (ref 3.87–5.11)
RDW: 13.8 % (ref 11.5–15.5)
WBC: 6.5 10*3/uL (ref 4.0–10.5)
nRBC: 0 % (ref 0.0–0.2)

## 2018-07-29 LAB — URINALYSIS, ROUTINE W REFLEX MICROSCOPIC
Bilirubin Urine: NEGATIVE
Glucose, UA: NEGATIVE mg/dL
Hgb urine dipstick: NEGATIVE
Ketones, ur: NEGATIVE mg/dL
Nitrite: NEGATIVE
Protein, ur: NEGATIVE mg/dL
Specific Gravity, Urine: 1.042 — ABNORMAL HIGH (ref 1.005–1.030)
pH: 6 (ref 5.0–8.0)

## 2018-07-29 LAB — LIPASE, BLOOD: Lipase: 21 U/L (ref 11–51)

## 2018-07-29 MED ORDER — CEPHALEXIN 500 MG PO CAPS: 500.0000 mg | ORAL_CAPSULE | Freq: Four times a day (QID) | ORAL | 0 refills | Status: DC

## 2018-07-29 MED ORDER — ALBUTEROL SULFATE HFA 108 (90 BASE) MCG/ACT IN AERS
2.0000 | INHALATION_SPRAY | RESPIRATORY_TRACT | Status: DC
  Administered 2018-07-29: 14:00:00 2 via RESPIRATORY_TRACT

## 2018-07-29 MED ORDER — ONDANSETRON 8 MG PO TBDP: 8.0000 mg | ORAL_TABLET | Freq: Three times a day (TID) | ORAL | 0 refills | Status: DC | PRN

## 2018-07-29 MED ORDER — SODIUM CHLORIDE (PF) 0.9 % IJ SOLN
INTRAMUSCULAR | Status: AC
  Filled 2018-07-29: qty 50

## 2018-07-29 MED ORDER — ALBUTEROL SULFATE HFA 108 (90 BASE) MCG/ACT IN AERS
2.0000 | INHALATION_SPRAY | Freq: Once | RESPIRATORY_TRACT | Status: AC
  Administered 2018-07-29: 10:00:00 2 via RESPIRATORY_TRACT
  Filled 2018-07-29: qty 6.7

## 2018-07-29 MED ORDER — ALBUTEROL SULFATE (2.5 MG/3ML) 0.083% IN NEBU
5.0000 mg | INHALATION_SOLUTION | Freq: Once | RESPIRATORY_TRACT | Status: DC
  Filled 2018-07-29: qty 6

## 2018-07-29 MED ORDER — IPRATROPIUM BROMIDE 0.02 % IN SOLN
0.5000 mg | Freq: Once | RESPIRATORY_TRACT | Status: DC
  Filled 2018-07-29: qty 2.5

## 2018-07-29 MED ORDER — SODIUM CHLORIDE 0.9 % IV SOLN
INTRAVENOUS | Status: DC
  Administered 2018-07-29: 10:00:00 via INTRAVENOUS

## 2018-07-29 MED ORDER — IOHEXOL 300 MG/ML  SOLN
100.0000 mL | Freq: Once | INTRAMUSCULAR | Status: AC | PRN
  Administered 2018-07-29: 12:00:00 100 mL via INTRAVENOUS

## 2018-07-29 NOTE — ED Provider Notes (Signed)
[facility]-EMERGENCY DEPT Provider Note   CSN: [id number] Arrival date & time: 07/29/18  4098    History   Chief Complaint Chief Complaint  Patient presents with  . Headache  . Emesis  . Chest Pain  . Abdominal Pain  . Cough    HPI Sherion Dooly is a 58 y.o. female.     This is a 58 year old female with history of asthma who presents with 3-day history of mild headache with cough and chest tightness along with watery diarrhea.  Her chest tightness is worse when she coughs and is not exertional.  She denies any fever or chills.  Patient had nausea and vomiting yesterday but she can drink Sprite at this time.  No urinary symptoms.  She also endorses abdominal discomfort which started a day after she began having her symptoms.  Abdominal discomfort is more in her right upper and lower quadrant and worse after she eats.  Pain is characterized as cramping.  No vaginal bleeding or discharge noted.  Denies being severely dyspneic at this time.  States her cough is been nonproductive.  Denies any anginal or CHF symptoms.  No treatment used prior to arrival.     Past Medical History:  Diagnosis Date  . Asthma   . Depression   . Migraines   . Pneumonia   . Thyroid disease     Patient Active Problem List   Diagnosis Date Noted  . Other abnormal glucose 03/09/2012  . Eczema, dyshidrotic 01/04/2012  . ANA POSITIVE 09/05/2008  . HYPERCHOLESTEROLEMIA 04/16/2008  . MIGRAINE HEADACHE 01/31/2008  . GERD 01/31/2008  . HYPOTHYROIDISM 01/11/2008  . OBESITY 01/11/2008  . TOBACCO ABUSE 01/11/2008  . ALLERGIC RHINITIS 01/11/2008  . ASTHMA 01/11/2008  . LOW BACK PAIN, CHRONIC 01/11/2008  . DEPRESSION 04/20/2005    Past Surgical History:  Procedure Laterality Date  . BACK SURGERY    . carpel    . CHOLECYSTECTOMY    . TONSILLECTOMY       OB History   No obstetric history on file.      Home Medications    Prior to Admission medications   Medication Sig  Start Date End Date Taking? Authorizing Provider  amoxicillin-clavulanate (AUGMENTIN) 875-125 MG per tablet Take 1 tablet by mouth every 12 (twelve) hours. Patient not taking: Reported on 05/28/2016 04/16/12   Roxy Horseman, PA-C  citalopram (CELEXA) 20 MG tablet Take 1 tablet (20 mg total) by mouth daily. Patient not taking: Reported on 05/28/2016 03/09/12   Etta Grandchild, MD  cyclobenzaprine (FLEXERIL) 10 MG tablet Take 1 tablet (10 mg total) by mouth 3 (three) times daily as needed for muscle spasms. 05/28/16   Shaune Pollack, MD  fluocinonide-emollient (LIDEX-E) 0.05 % cream Apply topically 2 (two) times daily. Patient not taking: Reported on 05/28/2016 03/09/12   Etta Grandchild, MD  HYDROcodone-acetaminophen (NORCO/VICODIN) 5-325 MG tablet Take 1-2 tablets by mouth every 4 (four) hours as needed for moderate pain or severe pain. 05/28/16   Shaune Pollack, MD  levothyroxine (SYNTHROID, LEVOTHROID) 112 MCG tablet Take 1 tablet (112 mcg total) by mouth daily. Patient not taking: Reported on 05/28/2016 04/19/12   Etta Grandchild, MD  naproxen sodium (ANAPROX) 220 MG tablet Take 440 mg by mouth 2 (two) times daily as needed. For pain.    [provider]  omeprazole (PRILOSEC) 20 MG capsule Take 20 mg by mouth daily.    [provider]  traZODone (DESYREL) 50 MG tablet Take 1 tablet (  50 mg total) by mouth at bedtime. Patient not taking: Reported on 05/28/2016 03/09/12   Etta Grandchild, MD    Family History Family History  Adopted: Yes  Problem Relation Age of Onset  . Cancer Neg Hx   . Diabetes Neg Hx   . Hyperlipidemia Neg Hx   . Hypertension Neg Hx   . Kidney disease Neg Hx   . Stroke Neg Hx     Social History Social History   Tobacco Use  . Smoking status: Current Every Day Smoker    Types: Cigarettes  . Smokeless tobacco: Never Used  Substance Use Topics  . Alcohol use: No    Comment: somtimes  . Drug use: No     Allergies   Aspirin and Codeine   Review  of Systems Review of Systems  All other systems reviewed and are negative.    Physical Exam Updated Vital Signs BP (!) 183/118   Pulse 68   Temp 98 F (36.7 C) (Oral)   Resp 16   Ht 1.676 m (5\' 6" )   Wt 122.5 kg   SpO2 98%   BMI 43.58 kg/m   Physical Exam Vitals signs and nursing note reviewed.  Constitutional:      General: She is not in acute distress.    Appearance: Normal appearance. She is well-developed. She is not toxic-appearing.  HENT:     Head: Normocephalic and atraumatic.  Eyes:     General: Lids are normal.     Conjunctiva/sclera: Conjunctivae normal.     Pupils: Pupils are equal, round, and reactive to light.  Neck:     Musculoskeletal: Normal range of motion and neck supple.     Thyroid: No thyroid mass.     Trachea: No tracheal deviation.  Cardiovascular:     Rate and Rhythm: Normal rate and regular rhythm.     Heart sounds: Normal heart sounds. No murmur. No gallop.   Pulmonary:     Effort: Pulmonary effort is normal. No respiratory distress.     Breath sounds: No stridor. Examination of the right-upper field reveals wheezing. Examination of the left-upper field reveals wheezing. Wheezing present. No decreased breath sounds, rhonchi or rales.  Abdominal:     General: Bowel sounds are normal. There is no distension.     Palpations: Abdomen is soft.     Tenderness: There is abdominal tenderness in the right upper quadrant and right lower quadrant. There is guarding. There is no rebound.    Musculoskeletal: Normal range of motion.        General: No tenderness.  Skin:    General: Skin is warm and dry.     Findings: No abrasion or rash.  Neurological:     Mental Status: She is alert and oriented to person, place, and time.     GCS: GCS eye subscore is 4. GCS verbal subscore is 5. GCS motor subscore is 6.     Cranial Nerves: No cranial nerve deficit.     Sensory: No sensory deficit.  Psychiatric:        Speech: Speech normal.        Behavior:  Behavior normal.      ED Treatments / Results  Labs (all labs ordered are listed, but only abnormal results are displayed) Labs Reviewed  CBC WITH DIFFERENTIAL/PLATELET  COMPREHENSIVE METABOLIC PANEL  LIPASE, BLOOD    EKG None  Radiology No results found.  Procedures Procedures (including critical care time)  Medications Ordered in ED Medications  0.9 %  sodium chloride infusion (has no administration in time range)     Initial Impression / Assessment and Plan / ED Course  I have reviewed the triage vital signs and the nursing notes.  Pertinent labs & imaging results that were available during my care of the patient were reviewed by me and considered in my medical decision making (see chart for details).        Patient given albuterol inhaler x2 for her wheezing and feels much better.  Had complained of having abdominal discomfort and abdominal CT shows possible early SBO.  Patient had no emesis here in did note that she could drink Sprite prior to arrival.  Her pain is well controlled at this time.  Patient's urinalysis noted and will start patient on antibiotics.  Patient to be placed on liquid diet and return precautions given  Final Clinical Impressions(s) / ED Diagnoses   Final diagnoses:  Cough    ED Discharge Orders    None       Lorre NickAllen, Myka Lukins, MD 07/29/18 1314

## 2018-07-29 NOTE — Discharge Instructions (Signed)
Return here at once should you have fever, severe abdominal pain, or vomiting

## 2018-07-29 NOTE — ED Triage Notes (Signed)
Patient c/o non productive cough x 2-3 days ago. Patient states she had N/V, abdominal pain, and chest heaviness x 2 days ago. Patient also c/o headache x 4-5 days ago.  Patient works at a [employer].

## 2019-01-15 ENCOUNTER — Other Ambulatory Visit: Payer: Self-pay

## 2019-01-15 DIAGNOSIS — E039 Hypothyroidism, unspecified: Secondary | ICD-10-CM | POA: Diagnosis not present

## 2019-01-15 DIAGNOSIS — J45909 Unspecified asthma, uncomplicated: Secondary | ICD-10-CM | POA: Diagnosis not present

## 2019-01-15 DIAGNOSIS — F17210 Nicotine dependence, cigarettes, uncomplicated: Secondary | ICD-10-CM | POA: Insufficient documentation

## 2019-01-15 DIAGNOSIS — M7989 Other specified soft tissue disorders: Secondary | ICD-10-CM | POA: Diagnosis not present

## 2019-01-15 DIAGNOSIS — M79641 Pain in right hand: Secondary | ICD-10-CM | POA: Diagnosis not present

## 2019-01-15 DIAGNOSIS — Z79899 Other long term (current) drug therapy: Secondary | ICD-10-CM | POA: Diagnosis not present

## 2019-01-16 ENCOUNTER — Other Ambulatory Visit: Payer: Self-pay

## 2019-01-16 ENCOUNTER — Encounter (HOSPITAL_COMMUNITY): Payer: Self-pay | Admitting: Emergency Medicine

## 2019-01-16 ENCOUNTER — Emergency Department (HOSPITAL_COMMUNITY): Payer: BC Managed Care – PPO

## 2019-01-16 ENCOUNTER — Emergency Department (HOSPITAL_COMMUNITY)
Admission: EM | Admit: 2019-01-16 | Discharge: 2019-01-16 | Disposition: A | Discharge status: Home / Self Care | Payer: BC Managed Care – PPO | Attending: Emergency Medicine | Admitting: Emergency Medicine

## 2019-01-16 DIAGNOSIS — M7989 Other specified soft tissue disorders: Secondary | ICD-10-CM | POA: Diagnosis not present

## 2019-01-16 DIAGNOSIS — M79641 Pain in right hand: Secondary | ICD-10-CM

## 2019-01-16 LAB — BASIC METABOLIC PANEL
Anion gap: 14 (ref 5–15)
BUN: 14 mg/dL (ref 6–20)
CO2: 23 mmol/L (ref 22–32)
Calcium: 10 mg/dL (ref 8.9–10.3)
Chloride: 102 mmol/L (ref 98–111)
Creatinine, Ser: 0.83 mg/dL (ref 0.44–1.00)
GFR calc Af Amer: >60 mL/min (ref >60)
GFR calc non Af Amer: >60 mL/min (ref >60)
Glucose, Bld: 117 mg/dL — ABNORMAL HIGH (ref 70–99)
Potassium: 3.8 mmol/L (ref 3.5–5.1)
Sodium: 139 mmol/L (ref 135–145)

## 2019-01-16 LAB — CBC WITH DIFFERENTIAL/PLATELET
Abs Immature Granulocytes: 0.04 10*3/uL (ref 0.00–0.07)
Basophils Absolute: 0 10*3/uL (ref 0.0–0.1)
Basophils Relative: 1 %
Eosinophils Absolute: 0.3 10*3/uL (ref 0.0–0.5)
Eosinophils Relative: 3 %
HCT: 40.6 % (ref 36.0–46.0)
Hemoglobin: 12.7 g/dL (ref 12.0–15.0)
Immature Granulocytes: 1 %
Lymphocytes Relative: 18 %
Lymphs Abs: 1.5 10*3/uL (ref 0.7–4.0)
MCH: 33.1 pg (ref 26.0–34.0)
MCHC: 31.3 g/dL (ref 30.0–36.0)
MCV: 105.7 fL — ABNORMAL HIGH (ref 80.0–100.0)
Monocytes Absolute: 0.4 10*3/uL (ref 0.1–1.0)
Monocytes Relative: 5 %
Neutro Abs: 6.2 10*3/uL (ref 1.7–7.7)
Neutrophils Relative %: 72 %
Platelets: 264 10*3/uL (ref 150–400)
RBC: 3.84 MIL/uL — ABNORMAL LOW (ref 3.87–5.11)
RDW: 13.9 % (ref 11.5–15.5)
WBC: 8.5 10*3/uL (ref 4.0–10.5)
nRBC: 0 % (ref 0.0–0.2)

## 2019-01-16 MED ORDER — HYDROCODONE-ACETAMINOPHEN 5-325 MG PO TABS: 1.0000 | ORAL_TABLET | ORAL | 0 refills | Status: DC | PRN

## 2019-01-16 MED ORDER — SODIUM CHLORIDE 0.9 % IV SOLN
100.0000 mg | Freq: Once | INTRAVENOUS | Status: AC
  Administered 2019-01-16: 05:00:00 100 mg via INTRAVENOUS
  Filled 2019-01-16: qty 100

## 2019-01-16 MED ORDER — DIPHENHYDRAMINE HCL 25 MG PO TABS: 25.0000 mg | ORAL_TABLET | Freq: Four times a day (QID) | ORAL | 0 refills | Status: DC | PRN

## 2019-01-16 MED ORDER — FAMOTIDINE 20 MG PO TABS: 20.0000 mg | ORAL_TABLET | Freq: Two times a day (BID) | ORAL | 0 refills | Status: DC

## 2019-01-16 MED ORDER — FENTANYL CITRATE (PF) 100 MCG/2ML IJ SOLN
50.0000 ug | Freq: Once | INTRAMUSCULAR | Status: AC
  Administered 2019-01-16: 05:00:00 50 ug via INTRAVENOUS
  Filled 2019-01-16: qty 2

## 2019-01-16 MED ORDER — HYDROCORTISONE 2.5 % EX LOTN: TOPICAL_LOTION | Freq: Two times a day (BID) | CUTANEOUS | 0 refills | Status: DC

## 2019-01-16 MED ORDER — DOXYCYCLINE HYCLATE 100 MG PO CAPS: 100.0000 mg | ORAL_CAPSULE | Freq: Two times a day (BID) | ORAL | 0 refills | Status: DC

## 2019-01-16 NOTE — ED Provider Notes (Signed)
[facility]-EMERGENCY DEPT Provider Note   CSN: [id number] Arrival date & time: 01/15/19  2304     History   Chief Complaint Chief Complaint  Patient presents with  . Hand Pain    HPI Bethany Beard is a 58 y.o. female.     Patient to ED for evaluation of right hand pain, redness and swelling x 2 days. No fever. She denies injury. The pain shoots from the hand proximally but there is no redness or swelling beyond the hand. No history of similar symptoms. Patient is right hand dominant.  The history is provided by the patient. No language interpreter was used.  Hand Pain    Past Medical History:  Diagnosis Date  . Asthma   . Depression   . Migraines   . Pneumonia   . Thyroid disease     Patient Active Problem List   Diagnosis Date Noted  . Other abnormal glucose 03/09/2012  . Eczema, dyshidrotic 01/04/2012  . ANA POSITIVE 09/05/2008  . HYPERCHOLESTEROLEMIA 04/16/2008  . MIGRAINE HEADACHE 01/31/2008  . GERD 01/31/2008  . HYPOTHYROIDISM 01/11/2008  . OBESITY 01/11/2008  . TOBACCO ABUSE 01/11/2008  . ALLERGIC RHINITIS 01/11/2008  . ASTHMA 01/11/2008  . LOW BACK PAIN, CHRONIC 01/11/2008  . DEPRESSION 04/20/2005    Past Surgical History:  Procedure Laterality Date  . BACK SURGERY    . carpel    . CHOLECYSTECTOMY    . TONSILLECTOMY       OB History   No obstetric history on file.      Home Medications    Prior to Admission medications   Medication Sig Start Date End Date Taking? Authorizing Provider  acetaminophen (TYLENOL) 500 MG tablet Take 2,000 mg by mouth every 6 (six) hours as needed for moderate pain.   Yes [provider]  Menthol-Methyl Salicylate (BEN GAY GREASELESS) 10-15 % greaseless cream Apply 1 application topically 3 (three) times daily as needed for pain.   Yes [provider]  omeprazole (PRILOSEC) 20 MG capsule Take 20 mg by mouth daily.   Yes [provider]  amoxicillin-clavulanate  (AUGMENTIN) 875-125 MG per tablet Take 1 tablet by mouth every 12 (twelve) hours. Patient not taking: Reported on 05/28/2016 04/16/12   Roxy HorsemanBrowning, Robert, PA-C  cephALEXin (KEFLEX) 500 MG capsule Take 1 capsule (500 mg total) by mouth 4 (four) times daily. 07/29/18   Lorre NickAllen, Anthony, MD  citalopram (CELEXA) 20 MG tablet Take 1 tablet (20 mg total) by mouth daily. Patient not taking: Reported on 05/28/2016 03/09/12   Etta GrandchildJones, Thomas L, MD  cyclobenzaprine (FLEXERIL) 10 MG tablet Take 1 tablet (10 mg total) by mouth 3 (three) times daily as needed for muscle spasms. Patient not taking: Reported on 07/29/2018 05/28/16   Shaune PollackIsaacs, Cameron, MD  fluocinonide-emollient (LIDEX-E) 0.05 % cream Apply topically 2 (two) times daily. Patient not taking: Reported on 05/28/2016 03/09/12   Etta GrandchildJones, Thomas L, MD  HYDROcodone-acetaminophen (NORCO/VICODIN) 5-325 MG tablet Take 1-2 tablets by mouth every 4 (four) hours as needed for moderate pain or severe pain. Patient not taking: Reported on 07/29/2018 05/28/16   Shaune PollackIsaacs, Cameron, MD  levothyroxine (SYNTHROID, LEVOTHROID) 112 MCG tablet Take 1 tablet (112 mcg total) by mouth daily. Patient not taking: Reported on 05/28/2016 04/19/12   Etta GrandchildJones, Thomas L, MD  ondansetron (ZOFRAN ODT) 8 MG disintegrating tablet Take 1 tablet (8 mg total) by mouth every 8 (eight) hours as needed for nausea or vomiting. Patient not taking: Reported on 01/16/2019 07/29/18  Lacretia Leigh, MD  traZODone (DESYREL) 50 MG tablet Take 1 tablet (50 mg total) by mouth at bedtime. Patient not taking: Reported on 05/28/2016 03/09/12   Janith Lima, MD    Family History Family History  Adopted: Yes  Problem Relation Age of Onset  . Cancer Neg Hx   . Diabetes Neg Hx   . Hyperlipidemia Neg Hx   . Hypertension Neg Hx   . Kidney disease Neg Hx   . Stroke Neg Hx     Social History Social History   Tobacco Use  . Smoking status: Current Every Day Smoker    Types: Cigarettes  . Smokeless tobacco: Never Used   Substance Use Topics  . Alcohol use: No    Comment: somtimes  . Drug use: No     Allergies   Aspirin and Codeine   Review of Systems Review of Systems  Constitutional: Negative for fever.  Gastrointestinal: Negative for nausea.  Musculoskeletal:       See HPI.  Skin: Positive for color change. Negative for wound.  Neurological: Negative for weakness and numbness.     Physical Exam Updated Vital Signs BP (!) 167/110 (BP Location: Right Arm)   Pulse 60   Temp 98.6 F (37 C) (Oral)   Resp 18   Ht 5\' 6"  (1.676 m)   Wt 127 kg   SpO2 100%   BMI 45.19 kg/m   Physical Exam Vitals signs and nursing note reviewed.  Constitutional:      Appearance: She is well-developed.  Neck:     Musculoskeletal: Normal range of motion.  Pulmonary:     Effort: Pulmonary effort is normal.  Musculoskeletal:     Comments: Right hand swelling that affects palmar and dorsal hand, with redness to proximal fingers 2-5. The hand is generally tender to both extensor and flexor surfaces equally. No lesion, wound or drainage. No swelling associated with nails or cuticles. Cap RF<2s.   Skin:    General: Skin is warm and dry.  Neurological:     Mental Status: She is alert and oriented to person, place, and time.     Sensory: No sensory deficit.      ED Treatments / Results  Labs (all labs ordered are listed, but only abnormal results are displayed) Labs Reviewed  CBC WITH DIFFERENTIAL/PLATELET  BASIC METABOLIC PANEL    EKG None  Radiology No results found.  Procedures Procedures (including critical care time)  Medications Ordered in ED Medications  fentaNYL (SUBLIMAZE) injection 50 mcg (has no administration in time range)  doxycycline (VIBRAMYCIN) 100 mg in sodium chloride 0.9 % 250 mL IVPB (has no administration in time range)     Initial Impression / Assessment and Plan / ED Course  I have reviewed the triage vital signs and the nursing notes.  Pertinent labs & imaging  results that were available during my care of the patient were reviewed by me and considered in my medical decision making (see chart for details).        Patient to ED for evaluation of right hand swelling and redness x 2 days without fever, injury.  Findings on exam are concerning for infection. Localized reaction is also considered. No leukocytosis, fever. Imaging negative.   IV Doxycycline provided. The patient is seen by Dr. Wyvonnia Dusky. Will continue antibiotic treatment outpatient but will provide coverage for possible localized allergic type reaction.   Discussed return precautions with the patient.   Final Clinical Impressions(s) / ED Diagnoses  Final diagnoses:  None   1. Right hand pain  ED Discharge Orders    None       Elpidio Anis, Cordelia Poche 01/18/19 Tildon Husky, MD 01/20/19 [phone]

## 2019-01-16 NOTE — ED Triage Notes (Signed)
Patient complaining of right hand swelling and redness that started on Saturday. Patient states it is painful to touch. Paitnet states that she does not know what happened.

## 2019-01-16 NOTE — Discharge Instructions (Addendum)
Take the antibiotic as prescribed to insure treatment of possible infection.   Use other medications as prescribed. Norco for pain as prescribed.   It is important the hand swelling and redness be rechecked in 2 days. Return here or go to [facility] for recheck.

## 2019-01-16 NOTE — ED Notes (Signed)
Discharge instructions reviewed with patient, no further questions. °

## 2019-02-17 ENCOUNTER — Emergency Department (HOSPITAL_COMMUNITY)
Admission: EM | Admit: 2019-02-17 | Discharge: 2019-02-17 | Disposition: A | Discharge status: Home / Self Care | Attending: Emergency Medicine | Admitting: Emergency Medicine

## 2019-02-17 ENCOUNTER — Emergency Department (HOSPITAL_COMMUNITY)

## 2019-02-17 DIAGNOSIS — Y9389 Activity, other specified: Secondary | ICD-10-CM | POA: Diagnosis not present

## 2019-02-17 DIAGNOSIS — W010XXA Fall on same level from slipping, tripping and stumbling without subsequent striking against object, initial encounter: Secondary | ICD-10-CM | POA: Diagnosis not present

## 2019-02-17 DIAGNOSIS — J45909 Unspecified asthma, uncomplicated: Secondary | ICD-10-CM | POA: Diagnosis not present

## 2019-02-17 DIAGNOSIS — S8992XA Unspecified injury of left lower leg, initial encounter: Secondary | ICD-10-CM | POA: Diagnosis not present

## 2019-02-17 DIAGNOSIS — E039 Hypothyroidism, unspecified: Secondary | ICD-10-CM | POA: Insufficient documentation

## 2019-02-17 DIAGNOSIS — Y92513 Shop (commercial) as the place of occurrence of the external cause: Secondary | ICD-10-CM | POA: Insufficient documentation

## 2019-02-17 DIAGNOSIS — F17210 Nicotine dependence, cigarettes, uncomplicated: Secondary | ICD-10-CM | POA: Diagnosis not present

## 2019-02-17 DIAGNOSIS — Y990 Civilian activity done for income or pay: Secondary | ICD-10-CM | POA: Insufficient documentation

## 2019-02-17 DIAGNOSIS — S8012XA Contusion of left lower leg, initial encounter: Secondary | ICD-10-CM | POA: Diagnosis not present

## 2019-02-17 DIAGNOSIS — M25562 Pain in left knee: Secondary | ICD-10-CM | POA: Diagnosis not present

## 2019-02-17 DIAGNOSIS — Z79899 Other long term (current) drug therapy: Secondary | ICD-10-CM | POA: Diagnosis not present

## 2019-02-17 DIAGNOSIS — W19XXXA Unspecified fall, initial encounter: Secondary | ICD-10-CM | POA: Diagnosis not present

## 2019-02-17 DIAGNOSIS — M79662 Pain in left lower leg: Secondary | ICD-10-CM | POA: Diagnosis not present

## 2019-02-17 DIAGNOSIS — M79672 Pain in left foot: Secondary | ICD-10-CM | POA: Diagnosis not present

## 2019-02-17 DIAGNOSIS — R52 Pain, unspecified: Secondary | ICD-10-CM | POA: Diagnosis not present

## 2019-02-17 DIAGNOSIS — R609 Edema, unspecified: Secondary | ICD-10-CM | POA: Diagnosis not present

## 2019-02-17 DIAGNOSIS — S99922A Unspecified injury of left foot, initial encounter: Secondary | ICD-10-CM | POA: Diagnosis not present

## 2019-02-17 MED ORDER — MORPHINE SULFATE (PF) 4 MG/ML IV SOLN
4.0000 mg | INTRAVENOUS | Status: DC | PRN
  Administered 2019-02-17: 16:00:00 4 mg via INTRAVENOUS
  Filled 2019-02-17: qty 1

## 2019-02-17 NOTE — ED Provider Notes (Addendum)
[facility] DEPT Provider Note   CSN: [id number] Arrival date & time: 02/17/19  1524     History   Chief Complaint Chief Complaint  Patient presents with  . Leg Injury    HPI Bethany Beard is a 58 y.o. female who presents with a fall and left leg pain. The patient states that she works at [employer] and was trying to plug a Games developer in to a machine and tripped and fell on to her left knee. She had an immediate onset of pain and was unable to bear weight after the accident. EMS was called and she was brought to the ED. She states the pain is primarily over the lateral aspect of the knee. It radiates down her leg. She also feels like her toes are sore because they were bent backwards during the fall. She feels like the leg is numb right now at rest but causes severe pain with movement. She has never had this before. She denies any other extremity pain.     HPI  Past Medical History:  Diagnosis Date  . Asthma   . Depression   . Migraines   . Pneumonia   . Thyroid disease     Patient Active Problem List   Diagnosis Date Noted  . Other abnormal glucose 03/09/2012  . Eczema, dyshidrotic 01/04/2012  . ANA POSITIVE 09/05/2008  . HYPERCHOLESTEROLEMIA 04/16/2008  . MIGRAINE HEADACHE 01/31/2008  . GERD 01/31/2008  . HYPOTHYROIDISM 01/11/2008  . OBESITY 01/11/2008  . TOBACCO ABUSE 01/11/2008  . ALLERGIC RHINITIS 01/11/2008  . ASTHMA 01/11/2008  . LOW BACK PAIN, CHRONIC 01/11/2008  . DEPRESSION 04/20/2005    Past Surgical History:  Procedure Laterality Date  . BACK SURGERY    . carpel    . CHOLECYSTECTOMY    . TONSILLECTOMY       OB History   No obstetric history on file.      Home Medications    Prior to Admission medications   Medication Sig Start Date End Date Taking? Authorizing Provider  acetaminophen (TYLENOL) 500 MG tablet Take 2,000 mg by mouth every 6 (six) hours as needed for moderate pain.    [provider]   amoxicillin-clavulanate (AUGMENTIN) 875-125 MG per tablet Take 1 tablet by mouth every 12 (twelve) hours. Patient not taking: Reported on 05/28/2016 04/16/12   Montine Circle, PA-C  cephALEXin (KEFLEX) 500 MG capsule Take 1 capsule (500 mg total) by mouth 4 (four) times daily. 07/29/18   Lacretia Leigh, MD  citalopram (CELEXA) 20 MG tablet Take 1 tablet (20 mg total) by mouth daily. Patient not taking: Reported on 05/28/2016 03/09/12   Janith Lima, MD  cyclobenzaprine (FLEXERIL) 10 MG tablet Take 1 tablet (10 mg total) by mouth 3 (three) times daily as needed for muscle spasms. Patient not taking: Reported on 07/29/2018 05/28/16   Duffy Bruce, MD  diphenhydrAMINE (BENADRYL) 25 MG tablet Take 1 tablet (25 mg total) by mouth every 6 (six) hours as needed. 01/16/19   Charlann Lange, PA-C  doxycycline (VIBRAMYCIN) 100 MG capsule Take 1 capsule (100 mg total) by mouth 2 (two) times daily. 01/16/19   Charlann Lange, PA-C  famotidine (PEPCID) 20 MG tablet Take 1 tablet (20 mg total) by mouth 2 (two) times daily. 01/16/19   Charlann Lange, PA-C  fluocinonide-emollient (LIDEX-E) 0.05 % cream Apply topically 2 (two) times daily. Patient not taking: Reported on 05/28/2016 03/09/12   Janith Lima, MD  HYDROcodone-acetaminophen (NORCO/VICODIN) 5-325 MG tablet Take 1-2  tablets by mouth every 4 (four) hours as needed. 01/16/19   Elpidio AnisUpstill, Shari, PA-C  hydrocortisone 2.5 % lotion Apply topically 2 (two) times daily. 01/16/19   Elpidio AnisUpstill, Shari, PA-C  levothyroxine (SYNTHROID, LEVOTHROID) 112 MCG tablet Take 1 tablet (112 mcg total) by mouth daily. Patient not taking: Reported on 05/28/2016 04/19/12   Etta GrandchildJones, Thomas L, MD  Menthol-Methyl Salicylate (BEN GAY GREASELESS) 10-15 % greaseless cream Apply 1 application topically 3 (three) times daily as needed for pain.    [provider]  omeprazole (PRILOSEC) 20 MG capsule Take 20 mg by mouth daily.    [provider]  ondansetron (ZOFRAN ODT) 8 MG  disintegrating tablet Take 1 tablet (8 mg total) by mouth every 8 (eight) hours as needed for nausea or vomiting. Patient not taking: Reported on 01/16/2019 07/29/18   Lorre NickAllen, Anthony, MD  traZODone (DESYREL) 50 MG tablet Take 1 tablet (50 mg total) by mouth at bedtime. Patient not taking: Reported on 05/28/2016 03/09/12   Etta GrandchildJones, Thomas L, MD    Family History Family History  Adopted: Yes  Problem Relation Age of Onset  . Cancer Neg Hx   . Diabetes Neg Hx   . Hyperlipidemia Neg Hx   . Hypertension Neg Hx   . Kidney disease Neg Hx   . Stroke Neg Hx     Social History Social History   Tobacco Use  . Smoking status: Current Every Day Smoker    Types: Cigarettes  . Smokeless tobacco: Never Used  Substance Use Topics  . Alcohol use: No    Comment: somtimes  . Drug use: No     Allergies   Aspirin, Codeine, Tomato, and Strawberry extract   Review of Systems Review of Systems  Respiratory: Negative for shortness of breath.   Cardiovascular: Negative for chest pain.  Gastrointestinal: Negative for abdominal pain.  Musculoskeletal: Positive for arthralgias, joint swelling and myalgias. Negative for back pain.  Skin: Negative for wound.  Neurological: Positive for numbness. Negative for weakness.  Hematological: Does not bruise/bleed easily.  All other systems reviewed and are negative.    Physical Exam Updated Vital Signs BP (!) 160/84   Pulse (!) 58   Temp 98.8 F (37.1 C) (Oral)   Resp 12   Ht 5\' 7"  (1.702 m)   Wt 127 kg   SpO2 95%   BMI 43.85 kg/m   Physical Exam Vitals signs and nursing note reviewed.  Constitutional:      General: She is not in acute distress.    Appearance: She is well-developed. She is obese. She is not ill-appearing.     Comments: Calm, cooperative. Conversant  HENT:     Head: Normocephalic and atraumatic.  Eyes:     General: No scleral icterus.       Right eye: No discharge.        Left eye: No discharge.     Conjunctiva/sclera:  Conjunctivae normal.     Pupils: Pupils are equal, round, and reactive to light.  Neck:     Musculoskeletal: Normal range of motion.  Cardiovascular:     Rate and Rhythm: Normal rate and regular rhythm.  Pulmonary:     Effort: Pulmonary effort is normal. No respiratory distress.     Breath sounds: Normal breath sounds.  Abdominal:     General: There is no distension.  Musculoskeletal:     Comments: Left lower extremity: No obvious deformity. No knee tenderness or swelling. There is mild bruising and swelling over the  left lateral shin with associated tenderness. No focal shin, ankle, foot tenderness. ROM deferred. 2+ DP pulse  Skin:    General: Skin is warm and dry.  Neurological:     Mental Status: She is alert and oriented to person, place, and time.  Psychiatric:        Behavior: Behavior normal.      ED Treatments / Results  Labs (all labs ordered are listed, but only abnormal results are displayed) Labs Reviewed - No data to display  EKG None  Radiology Dg Knee 2 Views Left  Result Date: 02/17/2019 CLINICAL DATA:  Fall, knee pain EXAM: LEFT KNEE - 1-2 VIEW COMPARISON:  None. FINDINGS: No fracture or dislocation of the left knee. Joint spaces are well preserved. No knee joint effusion. Vascular calcinosis. IMPRESSION: No fracture or dislocation of the left knee. Joint spaces are well preserved. No knee joint effusion. Electronically Signed   By: Lauralyn Primes M.D.   On: 02/17/2019 16:47   Dg Tibia/fibula Left  Result Date: 02/17/2019 CLINICAL DATA:  Fall, pain EXAM: LEFT TIBIA AND FIBULA - 2 VIEW COMPARISON:  None. FINDINGS: No fracture or dislocation of the left tibia or fibula. Soft tissues are unremarkable. IMPRESSION: No fracture or dislocation of the left tibia or fibula. Electronically Signed   By: Lauralyn Primes M.D.   On: 02/17/2019 16:47   Dg Foot Complete Left  Result Date: 02/17/2019 CLINICAL DATA:  Fall, pain EXAM: LEFT FOOT - COMPLETE 3+ VIEW COMPARISON:   None. FINDINGS: No definite fracture or dislocation of the left foot. There is a subtle irregularity of mid left fifth proximal phalanx. Joint spaces are well preserved. Soft tissues are unremarkable. IMPRESSION: No definite fracture or dislocation of the left foot. There is a subtle irregularity of mid left fifth proximal phalanx, which may reflect a nondisplaced fracture although not necessarily acute. Correlate for point tenderness. Electronically Signed   By: Lauralyn Primes M.D.   On: 02/17/2019 16:46    Procedures Procedures (including critical care time)  Medications Ordered in ED Medications - No data to display   Initial Impression / Assessment and Plan / ED Course  I have reviewed the triage vital signs and the nursing notes.  Pertinent labs & imaging results that were available during my care of the patient were reviewed by me and considered in my medical decision making (see chart for details).  58 year old female presents with a mechanical fall with a L leg injury. She is tender over the left lateral shin but is also reporting knee pain, foot pain. Will obtain Xrays.  Xrays are negative. There may be a non-displaced fracture of the 5th toe but she is not particularly tender here. She is most tender over the area that has mild bruising and swelling and the tib/fib xrays is normal. Will give crutches and RICE protocol was discussed. Work note given.  Final Clinical Impressions(s) / ED Diagnoses   Final diagnoses:  Contusion of left lower extremity, initial encounter    ED Discharge Orders    None       Bethel Born, PA-C 02/17/19 1910    Bethel Born, PA-C 02/17/19 1910    Milagros Loll, MD 02/18/19 [phone]

## 2019-02-17 NOTE — Discharge Instructions (Signed)
Rest - please stay off left leg as much as possible until you can put weight on it without severe pain Ice - ice for 20 minutes at a time, several times a day Wear brace for support and to prevent bending the knee Elevate - elevate the leg above level of heart Take Ibuprofen or Tylenol for pain Please follow up with your doctor

## 2019-02-17 NOTE — ED Notes (Signed)
Ortho tech just left room and sized patient for crutches. Pt said that she has used crutches many times before.

## 2019-02-17 NOTE — ED Triage Notes (Signed)
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 EMS noted lateral swelling and crepitus in knee. No LOC, head/neck trauma reported. Pt c/o pain and increasing numbness in left leg. Pt AOx4, received 100 mcg fentanyl by EMS with relief.

## 2020-01-16 ENCOUNTER — Emergency Department (HOSPITAL_COMMUNITY)
Admission: EM | Admit: 2020-01-16 | Discharge: 2020-01-16 | Disposition: A | Discharge status: Home / Self Care | Payer: BC Managed Care – PPO | Attending: Emergency Medicine | Admitting: Emergency Medicine

## 2020-01-16 ENCOUNTER — Encounter (HOSPITAL_COMMUNITY): Payer: Self-pay | Admitting: Emergency Medicine

## 2020-01-16 ENCOUNTER — Other Ambulatory Visit: Payer: Self-pay

## 2020-01-16 DIAGNOSIS — J45909 Unspecified asthma, uncomplicated: Secondary | ICD-10-CM | POA: Insufficient documentation

## 2020-01-16 DIAGNOSIS — E039 Hypothyroidism, unspecified: Secondary | ICD-10-CM | POA: Diagnosis not present

## 2020-01-16 DIAGNOSIS — F17210 Nicotine dependence, cigarettes, uncomplicated: Secondary | ICD-10-CM | POA: Insufficient documentation

## 2020-01-16 DIAGNOSIS — N3000 Acute cystitis without hematuria: Secondary | ICD-10-CM | POA: Insufficient documentation

## 2020-01-16 DIAGNOSIS — R300 Dysuria: Secondary | ICD-10-CM | POA: Diagnosis not present

## 2020-01-16 DIAGNOSIS — Z79890 Hormone replacement therapy (postmenopausal): Secondary | ICD-10-CM | POA: Insufficient documentation

## 2020-01-16 LAB — URINALYSIS, ROUTINE W REFLEX MICROSCOPIC
Bilirubin Urine: NEGATIVE
Glucose, UA: NEGATIVE mg/dL
Hgb urine dipstick: NEGATIVE
Ketones, ur: NEGATIVE mg/dL
Nitrite: NEGATIVE
Protein, ur: NEGATIVE mg/dL
Specific Gravity, Urine: 1.027 (ref 1.005–1.030)
pH: 5 (ref 5.0–8.0)

## 2020-01-16 MED ORDER — FLUCONAZOLE 150 MG PO TABS: ORAL_TABLET | ORAL | 0 refills | Status: DC

## 2020-01-16 MED ORDER — CEPHALEXIN 500 MG PO CAPS: 500.0000 mg | ORAL_CAPSULE | Freq: Two times a day (BID) | ORAL | 0 refills | Status: AC

## 2020-01-16 NOTE — Discharge Instructions (Signed)
You are seen today for urinary tract infection, please take antibiotics as directed.  After you are done taking your antibiotics you can take the Diflucan for yeast infection.  If your symptoms continue you need to see GYN, I think that this is in your best interest in general as we spoke about.  Please come back to the emergency department for any new or worsening concerning symptoms.  As we spoke about your blood pressure was elevated today, I want you to have this rechecked by your primary care in the next week.  Please use the following instructions.  Please stay hydrated.  Please talk to your pharmacist about any new medications or side effects to any medications that we prescribed you today.

## 2020-01-16 NOTE — ED Triage Notes (Signed)
Per pt, states urinary frequency and pain for about 3 weeks-states vaginal itching as well

## 2020-01-16 NOTE — ED Provider Notes (Signed)
[facility]   CSN: [id number] Arrival date & time: 01/16/20  8527     History Chief Complaint  Patient presents with  . Dysuria    Bethany Beard is a 59 y.o. female with remote medical history of asthma, depression, migraines that presents emergency department today for urinary frequency and dysuria for about 2-1/2 weeks.  Patient also states that for the past year and a half she has had some intermittent vaginal itching, is not occurring currently.  States that she does not normally have sexual intercourse, last time she had sexual intercourse was over 10 months ago.  Is not concerned about STDs.  Denies any vaginal discharge, vaginal bleeding, vaginal pain.  Is having some mild suprapubic pain.  No nausea, vomiting, fevers, new back pain.  Does have an appetite, has been eating and drinking normally.  Has tried Monistat for her vaginal itching about a couple months ago which did not provide any relief.  Has not seen GYN, has been trying to make an appointment but has not been able to due to Covid.  Denies any other symptoms.  Is not diabetic.  HPI     Past Medical History:  Diagnosis Date  . Asthma   . Depression   . Migraines   . Pneumonia   . Thyroid disease     Patient Active Problem List   Diagnosis Date Noted  . Other abnormal glucose 03/09/2012  . Eczema, dyshidrotic 01/04/2012  . ANA POSITIVE 09/05/2008  . HYPERCHOLESTEROLEMIA 04/16/2008  . MIGRAINE HEADACHE 01/31/2008  . GERD 01/31/2008  . HYPOTHYROIDISM 01/11/2008  . OBESITY 01/11/2008  . TOBACCO ABUSE 01/11/2008  . ALLERGIC RHINITIS 01/11/2008  . ASTHMA 01/11/2008  . LOW BACK PAIN, CHRONIC 01/11/2008  . DEPRESSION 04/20/2005    Past Surgical History:  Procedure Laterality Date  . BACK SURGERY    . carpel    . CHOLECYSTECTOMY    . TONSILLECTOMY       OB History   No obstetric history on file.     Family History  Adopted: Yes  Problem Relation Age  of Onset  . Cancer Neg Hx   . Diabetes Neg Hx   . Hyperlipidemia Neg Hx   . Hypertension Neg Hx   . Kidney disease Neg Hx   . Stroke Neg Hx     Social History   Tobacco Use  . Smoking status: Current Every Day Smoker    Types: Cigarettes  . Smokeless tobacco: Never Used  Vaping Use  . Vaping Use: Never used  Substance Use Topics  . Alcohol use: No    Comment: somtimes  . Drug use: No    Home Medications Prior to Admission medications   Medication Sig Start Date End Date Taking? Authorizing Provider  acetaminophen (TYLENOL) 500 MG tablet Take 2,000 mg by mouth every 6 (six) hours as needed for moderate pain.    [provider]  cephALEXin (KEFLEX) 500 MG capsule Take 1 capsule (500 mg total) by mouth 2 (two) times daily for 5 days. 01/16/20 01/21/20  Farrel Gordon, PA-C  diphenhydrAMINE (BENADRYL) 25 MG tablet Take 1 tablet (25 mg total) by mouth every 6 (six) hours as needed. Patient not taking: Reported on 02/17/2019 01/16/19   Elpidio Anis, PA-C  doxycycline (VIBRAMYCIN) 100 MG capsule Take 1 capsule (100 mg total) by mouth 2 (two) times daily. Patient not taking: Reported on 02/17/2019 01/16/19   Elpidio Anis, PA-C  famotidine (PEPCID) 20 MG tablet Take  1 tablet (20 mg total) by mouth 2 (two) times daily. Patient not taking: Reported on 02/17/2019 01/16/19   Elpidio Anis, PA-C  fluconazole (DIFLUCAN) 150 MG tablet Take after completion of antibiotics. 01/21/20   Farrel Gordon, PA-C  fluocinonide-emollient (LIDEX-E) 0.05 % cream Apply topically 2 (two) times daily. Patient not taking: Reported on 05/28/2016 03/09/12   Etta Grandchild, MD  HYDROcodone-acetaminophen (NORCO/VICODIN) 5-325 MG tablet Take 1-2 tablets by mouth every 4 (four) hours as needed. Patient not taking: Reported on 02/17/2019 01/16/19   Elpidio Anis, PA-C  hydrocortisone 2.5 % lotion Apply topically 2 (two) times daily. 01/16/19   Elpidio Anis, PA-C  levothyroxine (SYNTHROID, LEVOTHROID) 112 MCG  tablet Take 1 tablet (112 mcg total) by mouth daily. Patient not taking: Reported on 05/28/2016 04/19/12   Etta Grandchild, MD  Menthol-Methyl Salicylate (BEN GAY GREASELESS) 10-15 % greaseless cream Apply 1 application topically 3 (three) times daily as needed for pain.    [provider]  omeprazole (PRILOSEC) 20 MG capsule Take 20 mg by mouth daily.    [provider]  ondansetron (ZOFRAN ODT) 8 MG disintegrating tablet Take 1 tablet (8 mg total) by mouth every 8 (eight) hours as needed for nausea or vomiting. Patient not taking: Reported on 01/16/2019 07/29/18   Lorre Nick, MD  traZODone (DESYREL) 50 MG tablet Take 1 tablet (50 mg total) by mouth at bedtime. Patient not taking: Reported on 05/28/2016 03/09/12   Etta Grandchild, MD  citalopram (CELEXA) 20 MG tablet Take 1 tablet (20 mg total) by mouth daily. Patient not taking: Reported on 05/28/2016 03/09/12 02/17/19  Etta Grandchild, MD    Allergies    Aspirin, Codeine, Tomato, and Strawberry extract  Review of Systems   Review of Systems  Constitutional: Negative for diaphoresis, fatigue and fever.  Eyes: Negative for visual disturbance.  Respiratory: Negative for shortness of breath.   Cardiovascular: Negative for chest pain.  Gastrointestinal: Positive for abdominal pain (Suprapubic pain). Negative for diarrhea, nausea, rectal pain and vomiting.  Genitourinary: Positive for dysuria and urgency. Negative for decreased urine volume, difficulty urinating, dyspareunia, enuresis, flank pain, frequency, genital sores, hematuria, menstrual problem, pelvic pain, vaginal bleeding, vaginal discharge and vaginal pain.  Musculoskeletal: Negative for back pain and myalgias.  Skin: Negative for color change, pallor, rash and wound.  Neurological: Negative for dizziness, syncope, weakness, light-headedness, numbness and headaches.  Psychiatric/Behavioral: Negative for behavioral problems and confusion.    Physical Exam Updated  Vital Signs BP (!) 171/94 (BP Location: Left Arm)   Pulse 66   Temp 98.2 F (36.8 C) (Oral)   Resp 18   Ht 5\' 6"  (1.676 m)   Wt 127 kg   SpO2 96%   BMI 45.19 kg/m   Physical Exam Constitutional:      General: She is not in acute distress.    Appearance: Normal appearance. She is not ill-appearing, toxic-appearing or diaphoretic.  Cardiovascular:     Rate and Rhythm: Normal rate and regular rhythm.     Pulses: Normal pulses.     Heart sounds: Normal heart sounds.  Pulmonary:     Effort: Pulmonary effort is normal. No respiratory distress.     Breath sounds: Normal breath sounds. No stridor.  Abdominal:     General: Abdomen is flat. There is no distension.     Palpations: Abdomen is soft. There is no mass.     Tenderness: There is no abdominal tenderness. There is no right CVA tenderness, left CVA  tenderness, guarding or rebound.     Hernia: No hernia is present.  Genitourinary:    Comments: Deferred  Musculoskeletal:        General: Normal range of motion.  Skin:    General: Skin is warm and dry.     Capillary Refill: Capillary refill takes less than 2 seconds.  Neurological:     General: No focal deficit present.     Mental Status: She is alert and oriented to person, place, and time.  Psychiatric:        Mood and Affect: Mood normal.        Behavior: Behavior normal.        Thought Content: Thought content normal.     ED Results / Procedures / Treatments   Labs (all labs ordered are listed, but only abnormal results are displayed) Labs Reviewed  URINALYSIS, ROUTINE W REFLEX MICROSCOPIC - Abnormal; Notable for the following components:      Result Value   APPearance HAZY (*)    Leukocytes,Ua LARGE (*)    Bacteria, UA RARE (*)    All other components within normal limits  URINE CULTURE  CBG MONITORING, ED    EKG None  Radiology No results found.  Procedures Procedures (including critical care time)  Medications Ordered in ED Medications - No data to  display  ED Course  I have reviewed the triage vital signs and the nursing notes.  Pertinent labs & imaging results that were available during my care of the patient were reviewed by me and considered in my medical decision making (see chart for details).    MDM Rules/Calculators/A&P                         Quinn PlowmanKandee Mcclatchey is a 59 y.o. female with past medical history of asthma, depression, migraines that presents emergency department today for urinary frequency and dysuria for about 2-1/2 weeks.  Patient with symptoms of UTI, physical exam with no tenderness to abdomen, no CVA tenderness.  Patient is afebrile and nontachycardic.  Urinalysis is suggestive of UTI, urine culture obtained.  Do not think patient has pyelonephritis.  No concerns of nephrolithiasis or other intraabdominal dx at this time, patient appears very comfortable, no tenderness on PE. Did offer patient pelvic exam, she did not want this at this time.  Did also offer self swab for yeast infection, states that she also does not want this.  Does not want STD testing either.  BP elevated, did talk to patient about this and seeing PCP.  Will give patient antibiotics for UTI, did also give Diflucan for yeast infection that might occur after UTI since patient did not want this.  Strict precautions given, patient follow-up with PCP.  Patient agreeable.  Will also refer patient to women's outpatient clinic.  Doubt need for further emergent work up at this time. I explained the diagnosis and have given explicit precautions to return to the ER including for any other new or worsening symptoms. The patient understands and accepts the medical plan as it's been dictated and I have answered their questions. Discharge instructions concerning home care and prescriptions have been given. The patient is STABLE and is discharged to home in good condition.   Final Clinical Impression(s) / ED Diagnoses Final diagnoses:  Acute cystitis without  hematuria    Rx / DC Orders ED Discharge Orders         Ordered    fluconazole (DIFLUCAN) 150 MG  tablet        01/16/20 1713    cephALEXin (KEFLEX) 500 MG capsule  2 times daily        01/16/20 1713           Farrel Gordon, PA-C 01/16/20 1936    Charlynne Pander, MD 01/16/20 [phone]

## 2020-01-18 LAB — URINE CULTURE: Culture: >=100000 — AB

## 2020-01-19 ENCOUNTER — Telehealth: Payer: Self-pay

## 2020-01-19 NOTE — Telephone Encounter (Signed)
Post ED Visit - Positive Culture Follow-up  Culture report reviewed by antimicrobial stewardship pharmacist: [facility] []  , Pharm.D. []  Enzo Bi, Pharm.D., BCPS AQ-ID []  , Pharm.D., BCPS []  Celedonio Miyamoto, Pharm.D., BCPS []  [place], Garvin Fila.D., BCPS, AAHIVP []  , Pharm.D., BCPS, AAHIVP []  Georgina Pillion, PharmD, BCPS []  , PharmD, BCPS []  [place], PharmD, BCPS []  [address], PharmD []  , PharmD, BCPS []  Estella Husk, PharmD  Pharmacy Team []  Lysle Pearl, PharmD []  , PharmD []  Phillips Climes, PharmD []  , Rph []  Agapito Games) , PharmD []  Verlan Friends, PharmD []  , PharmD []  Mervyn Gay, PharmD []  , PharmD []  Vinnie Level, PharmD []  W[facility], PharmD []  , PharmD []  Len Childs, PharmD  Pharm D Positive urine culture Treated with Cephalexin, organism sensitive to the same and no further patient follow-up is required at this time.  Greer Pickerel 01/19/2020, 10:25 AM

## 2020-06-24 ENCOUNTER — Other Ambulatory Visit: Payer: Self-pay

## 2020-06-24 ENCOUNTER — Ambulatory Visit (INDEPENDENT_AMBULATORY_CARE_PROVIDER_SITE_OTHER): Payer: BC Managed Care – PPO | Admitting: Family Medicine

## 2020-06-24 ENCOUNTER — Ambulatory Visit (INDEPENDENT_AMBULATORY_CARE_PROVIDER_SITE_OTHER): Payer: BC Managed Care – PPO

## 2020-06-24 ENCOUNTER — Encounter: Payer: Self-pay | Admitting: Family Medicine

## 2020-06-24 VITALS — BP 144/88 | HR 67 | Ht 67.0 in | Wt 273.2 lb

## 2020-06-24 DIAGNOSIS — F17200 Nicotine dependence, unspecified, uncomplicated: Secondary | ICD-10-CM

## 2020-06-24 DIAGNOSIS — D508 Other iron deficiency anemias: Secondary | ICD-10-CM | POA: Diagnosis not present

## 2020-06-24 DIAGNOSIS — Z23 Encounter for immunization: Secondary | ICD-10-CM

## 2020-06-24 DIAGNOSIS — R030 Elevated blood-pressure reading, without diagnosis of hypertension: Secondary | ICD-10-CM

## 2020-06-24 DIAGNOSIS — M6740 Ganglion, unspecified site: Secondary | ICD-10-CM

## 2020-06-24 DIAGNOSIS — E038 Other specified hypothyroidism: Secondary | ICD-10-CM

## 2020-06-24 DIAGNOSIS — M67432 Ganglion, left wrist: Secondary | ICD-10-CM | POA: Insufficient documentation

## 2020-06-24 DIAGNOSIS — Z7689 Persons encountering health services in other specified circumstances: Secondary | ICD-10-CM | POA: Diagnosis not present

## 2020-06-24 MED ORDER — CITALOPRAM HYDROBROMIDE 10 MG PO TABS: 10.0000 mg | ORAL_TABLET | Freq: Every day | ORAL | 0 refills | Status: DC

## 2020-06-24 NOTE — Assessment & Plan Note (Signed)
Patient has longstanding and has not taken Thyroid supplement in 2-3 years. - Obtain TSH, T4, and T3

## 2020-06-24 NOTE — Assessment & Plan Note (Signed)
Patient had PHQ-9 score of 12.  Denies any SI.  Was previously on Citalopram 20 mg and Trazodone.  Plan to restart Citalopram at low dose. - Start Citalopram 10 mg daily - Can consider increasing dose as well as therapy referal at next visit

## 2020-06-24 NOTE — Assessment & Plan Note (Signed)
Currently smoking half a pack a day and has been doing so on and off for last 45 years.  Has previously quit multiple times past several times. - Discuss cessation strategies at next visit.

## 2020-06-24 NOTE — Patient Instructions (Signed)
It was good to see you today.  Thank you for coming in.  We are checking your Thyroid levels today, as well as blood sugar, cholesterol, electrolytes, blood cells, and Hepatitis C.  I will all you with the results and plan for if we want to restart you on thyroid medication and next dose.  I am also restarting you on a low dose Citalopram 10 mg to take daily.  You also received your COVID-19 booster and flu shot.  We also believe you have what is known as a ganglion cyst on your wrist.  We do not believe this is anything too concerning but if you develop severe pain please come back and see [place].  Please return in 1 month to check on blood pressure, other medical concerns.  Be Well, Dr Pecola Leisure

## 2020-06-24 NOTE — Progress Notes (Signed)
    e   SUBJECTIVE:   CHIEF COMPLAINT / HPI: Establish Care  Patient wishing to establish care.  Has had multiple diagnoses in past but not currently on any medication and hasn't been taking any for last 2-3 years.  Indicates problems she is most concerned about today are hypothroidism, depression, and mass on wrist.  For hypothyroidism, previously taking synthroid but unsure of dose.  Indicates last pill taken was pinkish/teal pill.  For depression was previously taking Citalopram and Trazodone.  Indicates both were helpful.  Indicates feeling more depressed lately as brother recently passed away.  Denies any SI.  Indicates has had growing place on front of wrist for 1 and a half weeks.  Has noticed it pulsating occasionally and indicates is painful.  Denies any redness, fevers, decreased ROM, weakness or numbness/tingling in hands.  PERTINENT  PMH / PSH: Depression  OBJECTIVE:   BP (!) 144/88   Pulse 67   Ht 5\' 7"  (1.702 m)   Wt 273 lb 3.2 oz (123.9 kg)   SpO2 96%   BMI 42.79 kg/m    Physical Exam HENT:     Head: Normocephalic and atraumatic.     Mouth/Throat:     Mouth: Mucous membranes are moist.  Cardiovascular:     Rate and Rhythm: Normal rate and regular rhythm.     Pulses: Normal pulses.     Heart sounds: Normal heart sounds.  Pulmonary:     Effort: Pulmonary effort is normal.     Breath sounds: Normal breath sounds.  Musculoskeletal:     Right wrist: Normal.     Left wrist: Deformity present. No bony tenderness or snuff box tenderness. Normal range of motion.     Comments: Pulsating solid mass in area of   Neurological:     Mental Status: She is alert.     ASSESSMENT/PLAN:   DEPRESSION Patient had PHQ-9 score of 12.  Denies any SI.  Was previously on Citalopram 20 mg and Trazodone.  Plan to restart Citalopram at low dose. - Start Citalopram 10 mg daily - Can consider increasing dose as well as therapy referal at next visit  Encounter to establish  care Patient reports having Colonoscopy, Mammogram, and Papsmear all in 2020. - Obtain BMP, Lipid Panel, A1C, and Hep C today - Administer COVID booaster and flu shot today - Obtain release of information at next visit.  Hypothyroidism Patient has longstanding and has not taken Thyroid supplement in 2-3 years. - Obtain TSH, T4, and T3  Ganglion cyst Likely diagnosis given specific appearance and location.  In area of radial artery of ventral wrist.  Precepted with Dr McDiarmid.  Good blood supply andf cap refill. Do not believe there is concern for vascular compromise. - follow up in 1 month for management - return sooner if pain significantly worsens   TOBACCO ABUSE Currently smoking half a pack a day and has been doing so on and off for last 45 years.  Has previously quit multiple times past several times. - Discuss cessation strategies at next visit.      2021, MD [facility]

## 2020-06-24 NOTE — Assessment & Plan Note (Signed)
Elevated today at 144/88.  Similar reading on repeat.   - follow up blood pressure in 1 month

## 2020-06-24 NOTE — Assessment & Plan Note (Signed)
Likely diagnosis given specific appearance and location.  In area of radial artery of ventral wrist.  Precepted with Dr McDiarmid.  Good blood supply andf cap refill. Do not believe there is concern for vascular compromise. - follow up in 1 month for management - return sooner if pain significantly worsens

## 2020-06-24 NOTE — Assessment & Plan Note (Signed)
Patient reports having Colonoscopy, Mammogram, and Papsmear all in 2020. - Obtain BMP, Lipid Panel, A1C, and Hep C today - Administer COVID booaster and flu shot today - Obtain release of information at next visit.

## 2020-06-25 LAB — LIPID PANEL
Chol/HDL Ratio: 4 ratio (ref 0.0–4.4)
Cholesterol, Total: 274 mg/dL — ABNORMAL HIGH (ref 100–199)
HDL: 68 mg/dL (ref >39)
LDL Chol Calc (NIH): 171 mg/dL — ABNORMAL HIGH (ref 0–99)
Triglycerides: 192 mg/dL — ABNORMAL HIGH (ref 0–149)
VLDL Cholesterol Cal: 35 mg/dL (ref 5–40)

## 2020-06-25 LAB — COMPREHENSIVE METABOLIC PANEL
ALT: 13 IU/L (ref 0–32)
AST: 24 IU/L (ref 0–40)
Albumin/Globulin Ratio: 1.7 (ref 1.2–2.2)
Albumin: 4.5 g/dL (ref 3.8–4.9)
Alkaline Phosphatase: 66 IU/L (ref 44–121)
BUN/Creatinine Ratio: 15 (ref 9–23)
BUN: 14 mg/dL (ref 6–24)
Bilirubin Total: 0.3 mg/dL (ref 0.0–1.2)
CO2: 23 mmol/L (ref 20–29)
Calcium: 9.7 mg/dL (ref 8.7–10.2)
Chloride: 101 mmol/L (ref 96–106)
Creatinine, Ser: 0.92 mg/dL (ref 0.57–1.00)
Globulin, Total: 2.7 g/dL (ref 1.5–4.5)
Glucose: 102 mg/dL — ABNORMAL HIGH (ref 65–99)
Potassium: 4.2 mmol/L (ref 3.5–5.2)
Sodium: 140 mmol/L (ref 134–144)
Total Protein: 7.2 g/dL (ref 6.0–8.5)
eGFR: 72 mL/min/{1.73_m2} (ref >59)

## 2020-06-25 LAB — CBC WITH DIFFERENTIAL/PLATELET
Basophils Absolute: 0.1 10*3/uL (ref 0.0–0.2)
Basos: 1 %
EOS (ABSOLUTE): 0.4 10*3/uL (ref 0.0–0.4)
Eos: 9 %
Hematocrit: 37.2 % (ref 34.0–46.6)
Hemoglobin: 12.1 g/dL (ref 11.1–15.9)
Immature Grans (Abs): 0 10*3/uL (ref 0.0–0.1)
Immature Granulocytes: 0 %
Lymphocytes Absolute: 1.2 10*3/uL (ref 0.7–3.1)
Lymphs: 23 %
MCH: 33 pg (ref 26.6–33.0)
MCHC: 32.5 g/dL (ref 31.5–35.7)
MCV: 101 fL — ABNORMAL HIGH (ref 79–97)
Monocytes Absolute: 0.4 10*3/uL (ref 0.1–0.9)
Monocytes: 7 %
Neutrophils Absolute: 3.1 10*3/uL (ref 1.4–7.0)
Neutrophils: 60 %
Platelets: 252 10*3/uL (ref 150–450)
RBC: 3.67 x10E6/uL — ABNORMAL LOW (ref 3.77–5.28)
RDW: 13 % (ref 11.7–15.4)
WBC: 5.2 10*3/uL (ref 3.4–10.8)

## 2020-06-25 LAB — TSH: TSH: 44 u[IU]/mL — ABNORMAL HIGH (ref 0.450–4.500)

## 2020-06-25 LAB — T3, FREE: T3, Free: 1.1 pg/mL — ABNORMAL LOW (ref 2.0–4.4)

## 2020-06-25 LAB — HEMOGLOBIN A1C
Est. average glucose Bld gHb Est-mCnc: 128 mg/dL
Hgb A1c MFr Bld: 6.1 % — ABNORMAL HIGH (ref 4.8–5.6)

## 2020-06-25 LAB — T4, FREE: Free T4: 0.17 ng/dL — ABNORMAL LOW (ref 0.82–1.77)

## 2020-06-27 ENCOUNTER — Telehealth: Payer: Self-pay | Admitting: Family Medicine

## 2020-06-27 MED ORDER — LEVOTHYROXINE SODIUM 75 MCG PO TABS: 75.0000 ug | ORAL_TABLET | ORAL | 3 refills | Status: DC

## 2020-06-27 NOTE — Telephone Encounter (Signed)
Spoke with patient on phone about lab results.  Prescribed 75 mcg Levothyroxine given hypothyroidism after discussing with Pharmacy.

## 2020-08-06 ENCOUNTER — Encounter: Payer: Self-pay | Admitting: Family Medicine

## 2020-08-06 ENCOUNTER — Ambulatory Visit (INDEPENDENT_AMBULATORY_CARE_PROVIDER_SITE_OTHER): Payer: BC Managed Care – PPO | Admitting: Family Medicine

## 2020-08-06 ENCOUNTER — Other Ambulatory Visit: Payer: Self-pay

## 2020-08-06 VITALS — BP 142/70 | HR 73 | Ht 67.0 in | Wt 273.5 lb

## 2020-08-06 DIAGNOSIS — E039 Hypothyroidism, unspecified: Secondary | ICD-10-CM

## 2020-08-06 DIAGNOSIS — G8929 Other chronic pain: Secondary | ICD-10-CM

## 2020-08-06 DIAGNOSIS — M25569 Pain in unspecified knee: Secondary | ICD-10-CM | POA: Insufficient documentation

## 2020-08-06 DIAGNOSIS — M25561 Pain in right knee: Secondary | ICD-10-CM | POA: Diagnosis not present

## 2020-08-06 DIAGNOSIS — M6740 Ganglion, unspecified site: Secondary | ICD-10-CM | POA: Diagnosis not present

## 2020-08-06 DIAGNOSIS — I10 Essential (primary) hypertension: Secondary | ICD-10-CM | POA: Insufficient documentation

## 2020-08-06 DIAGNOSIS — M25562 Pain in left knee: Secondary | ICD-10-CM

## 2020-08-06 DIAGNOSIS — F330 Major depressive disorder, recurrent, mild: Secondary | ICD-10-CM

## 2020-08-06 MED ORDER — CITALOPRAM HYDROBROMIDE 20 MG PO TABS: 20.0000 mg | ORAL_TABLET | Freq: Every day | ORAL | 3 refills | Status: DC

## 2020-08-06 NOTE — Assessment & Plan Note (Signed)
Blood pressure currently elevated >140 again today.  Discussed lifestyle cahnges with patient today as patient did not want to start medication. - follow up BP in 6 weeks, will likely need to start on medication if elevated at next visit again

## 2020-08-06 NOTE — Assessment & Plan Note (Addendum)
Minor amount of swelling.  Possible osteoarthritis.  Low concern for septic joint, gout or inflammatory process.  Discussed with patient decreasing Tylenol dose and using combination of Ibuprofen and Tylenol for pain control - Decreassde Tylenol use and take 2 Tylenol 500mg  and 2 Ibuprofen 200mg  every 6 hours

## 2020-08-06 NOTE — Patient Instructions (Signed)
It was good to see you today.  Thank you for coming in.  I am putting in a referral to Sports Medicine for the cyst on your wrist.  I am getting repeat Thyroid labs on you today.  I will call you with the results and if we need to increase  For your knee pain I recommend taking 2 Tylenol and 2 Advil every 6 hours.  I am increasing your Citalopram to 20 mg and providing a list of counseling services which you are able to utilize.   Be Well, Dr Pecola Leisure   Therapy and Counseling Resources Most providers on this list will take Medicaid. Patients with commercial insurance or Medicare should contact their insurance company to get a list of in network providers.  [facility]iatry and Counseling [address] [place], [place] [address] [phone]  [facility]  [address] Suite [place], [place] [address]      [phone]  [facility] [address]  [place], [place] [address] [phone]  [facility] [address], [address]  [place], [place] [address]       [phone]     [facility] (virtual only) [phone]  Jovita Kussmaul [facility] [address] [place], [place] [phone]  [facility]:  [address] [place] [phone]  [facility]:  [address] [phone]  [facility] (under & uninsured) [address]   [place] [phone]    [email or web address]    [facility] [address] . [place]    [phone]  [facility] -[address]     Phone:  [phone]     [facility]-  [address]  [phone]   [facility] [address]      [place], [place] [phone]  [facility]: [address] [place], [place], [place]  [phone]   [facility] (Spanish therapist)  [email or web address].[email or web address].org/  [address] [place]  [address]   [place] [address]    [phone]    [facility]   [address],  [place], [place]  [phone]   [facility]  [address] [place]  [phone]  [facility]  [address] [place], [place]        [phone]  Open Access/Walk In Clinic under & uninsured  [facility]  [address] [place], [place] [phone] Crisis [phone]  [facility] [place],  (Spanish)   [address], [place]: [phone]) 8:30 - 12; 1 - 2:30  [facility] [employer],  [address] [place], [place]    [phone]):8:30 - 12; 2 - 3PM  [facility] [employer],  [address]  [place]; [phone]):   Mon - Fri 8 AM - 5 PM  [facility] [address] [place]  MWF 12:30 to 3:00 or call to schedule an appointment  [phone]  Specific Provider options Psychology Today  https://www.psychologytoday.com/us 1. click on find a therapist  2. enter your zip code 3. left side and select or tailor a therapist for your specific need.   [facility] Provider Directory [email or web address]  (Medicaid)   Follow all drop down to find a provider  Social Support program Mental Health [place] 336)  [phone] or PhotoSolver.pl [address], [place], [place] [facility] support and educational   24- Hour Availability:  .  Marland Kitchen [facility]  . [address] [place], [place] [employer] [phone] Crisis [phone]  . [facility] [phone]  [facility] Crisis Service  [phone]   . [facility] [employer]  [phone] (after hours)  . Therapeutic Alternative/Mobile Crisis   [phone]  . [place] National Suicide Hotline  (949) 445-3718 (TALK)  . Call 911 or go to emergency  room  . [employer]  [phone]);  [place] and [employer]   . [facility]  [phone]); [place], [place], [place], [place], [place], [place], [place]

## 2020-08-06 NOTE — Progress Notes (Signed)
.     SUBJECTIVE:   CHIEF COMPLAINT / HPI:   Patient returns today for follow-up on hypothyroidism and depression.  Indicates energy level up some but not much and mood is about the same.  Indicates she has been taking her Thyroid supplement and Citalopram as indicated.  Indicates she feels that Ganglion cyst has become more painful and wanting it aspirated.  Patient also complaining of knee pain.  Indicates this is chronic problem and is taking 5 tablets of 500 mg Tylenol every 6 hours.    PERTINENT  PMH / PSH:   OBJECTIVE:   BP (!) 142/70 Comment: Right arm  Pulse 73   Ht 5\' 7"  (1.702 m)   Wt 273 lb 8 oz (124.1 kg)   SpO2 95%   BMI 42.84 kg/m    Physical Exam Constitutional:      General: She is not in acute distress.    Appearance: She is not ill-appearing.  HENT:     Head: Normocephalic and atraumatic.     Mouth/Throat:     Mouth: Mucous membranes are moist.  Cardiovascular:     Rate and Rhythm: Normal rate and regular rhythm.     Pulses: Normal pulses.  Pulmonary:     Effort: Pulmonary effort is normal.     Breath sounds: Normal breath sounds.  Musculoskeletal:     Right knee: Effusion present. No erythema or bony tenderness. Normal range of motion.     Left knee: Effusion present. No erythema or bony tenderness. Normal range of motion.     Right lower leg: No edema.     Left lower leg: No edema.     Comments: Minor amount of swelling in both knees  Neurological:     General: No focal deficit present.     Mental Status: She is alert.     ASSESSMENT/PLAN:   Hypertension Blood pressure currently elevated >140 again today.  Discussed lifestyle cahnges with patient today as patient did not want to start medication. - follow up BP in 6 weeks, will likely need to start on medication if elevated at next visit again  Ganglion cyst Still has good pulse and cap refill in radial artery and hand.  No evidence of neurovascular comrpomise or other indication for emergent  management.   - Referral to Sports Medicine for treatment   Knee pain Minor amount of swelling.  Possible osteoarthritis.  Low concern for septic joint, gout or inflammatory process.  Discussed with patient decreasing Tylenol dose and using combination of Ibuprofen and Tylenol for pain control - Decreassde Tylenol use and take 2 Tylenol 500mg  and 2 Ibuprofen 200mg  every 6 hours  MDD (major depressive disorder) PHQ-9 of 12.  Indicates has had only mild improvement in symptoms.  Denies any SI.  Currently on 10 mg Citalopram.  Previously on 20 mg. - Increase Citalopram to 20 mg - Gave patient resources for counseling, indicates she will consider reaching out   Hypothyroidism Currently on Synthroid 90 mcg.  Indicates some improvement in energy. - Obtain TSH, T3 and T4, will discuss eith patient possible increase in dose based on levels - follow up in 6 weeks     , MD [facility] Medicine [facility]

## 2020-08-06 NOTE — Assessment & Plan Note (Signed)
Still has good pulse and cap refill in radial artery and hand.  No evidence of neurovascular comrpomise or other indication for emergent management.   - Referral to Sports Medicine for treatment

## 2020-08-07 LAB — T3, FREE: T3, Free: 2.2 pg/mL (ref 2.0–4.4)

## 2020-08-07 LAB — TSH: TSH: 14.8 u[IU]/mL — ABNORMAL HIGH (ref 0.450–4.500)

## 2020-08-07 LAB — T4, FREE: Free T4: 0.89 ng/dL (ref 0.82–1.77)

## 2020-08-07 NOTE — Assessment & Plan Note (Addendum)
PHQ-9 of 12.  Indicates has had only mild improvement in symptoms.  Denies any SI.  Currently on 10 mg Citalopram.  Previously on 20 mg. - Increase Citalopram to 20 mg - Gave patient resources for counseling, indicates she will consider reaching out

## 2020-08-07 NOTE — Assessment & Plan Note (Signed)
Currently on Synthroid 90 mcg.  Indicates some improvement in energy. - Obtain TSH, T3 and T4, will discuss eith patient possible increase in dose based on levels - follow up in 6 weeks

## 2020-08-12 ENCOUNTER — Telehealth: Payer: Self-pay | Admitting: Family Medicine

## 2020-08-12 MED ORDER — LEVOTHYROXINE SODIUM 88 MCG PO TABS: 88.0000 ug | ORAL_TABLET | ORAL | 3 refills | Status: DC

## 2020-08-12 NOTE — Telephone Encounter (Signed)
Spoke with patient on phone and informed of labs and increase in Thyroid medication.

## 2020-08-12 NOTE — Addendum Note (Signed)
Addended by: Jovita Kussmaul on: 08/12/2020 04:48 PM   Modules accepted: Orders

## 2020-08-14 ENCOUNTER — Ambulatory Visit (INDEPENDENT_AMBULATORY_CARE_PROVIDER_SITE_OTHER): Payer: BC Managed Care – PPO | Admitting: Family Medicine

## 2020-08-14 ENCOUNTER — Encounter: Payer: Self-pay | Admitting: Family Medicine

## 2020-08-14 ENCOUNTER — Other Ambulatory Visit: Payer: Self-pay

## 2020-08-14 ENCOUNTER — Ambulatory Visit: Payer: Self-pay

## 2020-08-14 VITALS — BP 179/80 | Ht 67.0 in | Wt 273.0 lb

## 2020-08-14 DIAGNOSIS — S46012A Strain of muscle(s) and tendon(s) of the rotator cuff of left shoulder, initial encounter: Secondary | ICD-10-CM

## 2020-08-14 DIAGNOSIS — M67432 Ganglion, left wrist: Secondary | ICD-10-CM | POA: Diagnosis not present

## 2020-08-14 MED ORDER — CELECOXIB 100 MG PO CAPS: 100.0000 mg | ORAL_CAPSULE | Freq: Two times a day (BID) | ORAL | 0 refills | Status: DC

## 2020-08-14 NOTE — Progress Notes (Signed)
SUBJECTIVE:   CHIEF COMPLAINT / HPI: painful cyst  Patient is a 60 year old female who presents for evaluation of possible ganglion cyst on her wrist, she also has a concern about her left shoulder.  Bump on wrist Patient reports that about 6 months ago she noticed a bump on the radial aspect of her left wrist.  It is about 2 cm proximal to the radial styloid and on the palmar aspect of the wrist.  Initially, she is not bothered by it but it has enlarged and is now causing shooting pains into her thumb sometimes some numbness and tingling, and an aching pain when she moves her thumb.  She has not had any trauma or injury to the area, she has not had anything like this before.  Left shoulder Patient also notes that about a week ago, she suffered a fall out of the car.  She was holding the handle on the side of the car, and fell, landing on her bottom.  She felt a pain in her shoulder when this happened, and has been painful ever since.  She is able to move her arm around but it hurts especially with overhead movements.  No numbness or tingling or weakness that she is aware of.   PERTINENT  PMH / PSH: Reviewed patient's past medical, family and social history, please see associated documentation for details.  OBJECTIVE:   BP (!) 179/80   Ht 5\' 7"  (1.702 m)   Wt 273 lb (123.8 kg)   BMI 42.76 kg/m    General: Alert, no acute distress Cardio: Normal S1 and S2, RRR, no r/m/g Pulm: CTAB, normal work of breathing Abdomen: Bowel sounds normal. Abdomen soft and non-tender.  Extremities: No peripheral edema.  Neuro: Cranial nerves grossly intact  Left wrist: -Inspection: There is an approximately 1 cm mobile hard rubbery cyst over the radial aspect of the palmar aspect of the wrist in close proximity to the radial artery. -Palpation: Cyst is mildly tender to palpation.  No surrounding erythema or warmth. -ROM: Full range of motion of the wrist with minimal pain -Strength: Full strength  of the hand and wrist without pain  Left shoulder: -Inspection: no obvious deformity, atrophy, or asymmetry. No bruising. No swelling -Palpation: Nontender over [facility] joint, + tenderness over bicipital groove. -ROM: Full ROM in abduction, flexion, internal/external rotation both passively and actively, however with pain with abduction and overhead movements  NV intact distally Normal scapular function observed. Special Tests:  - Impingement:  Positive Hawkins, Neg neers, positive empty can sign. - Supraspinatus: Positive empty can.  5/5 strength with resisted flexion at 20 degrees - Infraspinatus/Teres Minor: 5/5 strength with ER - Subscapularis: 5/5 strength with IR - Biceps tendon: Neg Speeds - Labrum: Negative Obriens, good stability - No painful arc and no drop arm sign  Limited [facility] of left wrist: There is an anechoic simple cyst measuring approximately 1.5 x 1.5 cm.  Laying in close proximity to this is radial artery and abductor pollicis longus tendon.  Impression: -Findings consistent with ganglion cyst  [place] L shoulder: -Biceps tendon: Well visualized within the bicipital groove and without any abnormalities -Pectoralis: Insertion visualized and without abnormalities. -Subscapularis: Well visualized to insertion point on humerus.  There is a small area of flattening on the superficial surface of the subscapularis tendon without complete tear.  Dynamic testing over the coracoid did not show signs of impingement. -Supraspinatus: Well-visualized and without any abnormalities.  Dynamic testing did not reveal signs of impingement. -  Subacromial bursa: No obvious enlargement or swelling.  Impression: -Small area of flattening of the subscapularis tendon suggestive of very small partial rotator cuff tear  ASSESSMENT/PLAN:  1.  Ganglion cyst of left wrist We discussed options with patient and she like to proceed with aspiration.  This was performed today under ultrasound guidance with  special attention to avoid the radial artery.  This was performed successfully, will plan to follow-up as needed for this.  Discussed that it is possible that this can recur, and that we could consider reaspiration versus referral to a hand specialist for removal.  2.  Left shoulder rotator cuff partial tear Ultrasound findings and exam findings consistent with partial tear of the subscapularis tendon.  No signs of complete tear and she does not have strength deficit which is reassuring.  We will plan to monitor at this time, she was prescribed Celebrex today, she can also take Tylenol.  Follow-up as needed if not improving with conservative management.  Procedure note: Patient's clinical status is marked by significant pain and/or functional disability.  Other conservative was nonparetic relief or not indicated.  Risks and benefits of left wrist ganglion cyst aspiration were discussed with patient and she elected to proceed.  Prior to the procedure, timeout was called and correct location was identified.  Patient was then cleaned with alcohol swab, and using sterile ultrasound technique the ganglion cyst was identified along with surrounding structures.  1 cc of lidocaine was then used to anesthetize the skin and after allowing time for anesthetization, an 18-gauge needle was used to aspirate the cyst.  This was all well visualized under ultrasound.  Approximately 2 cc of gelatinous clear fluid was aspirated.  She tolerated procedure well.  Addendum:  I was the preceptor for this visit and available for immediate consultation.  Norton Blizzard MD Marrianne Mood

## 2020-09-06 ENCOUNTER — Emergency Department (HOSPITAL_COMMUNITY)
Admission: EM | Admit: 2020-09-06 | Discharge: 2020-09-06 | Disposition: A | Discharge status: Home / Self Care | Payer: BC Managed Care – PPO | Attending: Emergency Medicine | Admitting: Emergency Medicine

## 2020-09-06 ENCOUNTER — Encounter (HOSPITAL_COMMUNITY): Payer: Self-pay | Admitting: *Deleted

## 2020-09-06 ENCOUNTER — Other Ambulatory Visit: Payer: Self-pay

## 2020-09-06 ENCOUNTER — Ambulatory Visit (INDEPENDENT_AMBULATORY_CARE_PROVIDER_SITE_OTHER): Payer: BC Managed Care – PPO | Admitting: Family Medicine

## 2020-09-06 ENCOUNTER — Emergency Department (HOSPITAL_COMMUNITY): Payer: BC Managed Care – PPO

## 2020-09-06 VITALS — BP 140/78

## 2020-09-06 DIAGNOSIS — Z5321 Procedure and treatment not carried out due to patient leaving prior to being seen by health care provider: Secondary | ICD-10-CM | POA: Diagnosis not present

## 2020-09-06 DIAGNOSIS — J45909 Unspecified asthma, uncomplicated: Secondary | ICD-10-CM | POA: Diagnosis not present

## 2020-09-06 DIAGNOSIS — R058 Other specified cough: Secondary | ICD-10-CM | POA: Insufficient documentation

## 2020-09-06 DIAGNOSIS — R197 Diarrhea, unspecified: Secondary | ICD-10-CM | POA: Diagnosis not present

## 2020-09-06 DIAGNOSIS — R110 Nausea: Secondary | ICD-10-CM | POA: Diagnosis not present

## 2020-09-06 DIAGNOSIS — R059 Cough, unspecified: Secondary | ICD-10-CM | POA: Insufficient documentation

## 2020-09-06 DIAGNOSIS — R0981 Nasal congestion: Secondary | ICD-10-CM | POA: Insufficient documentation

## 2020-09-06 DIAGNOSIS — M791 Myalgia, unspecified site: Secondary | ICD-10-CM | POA: Insufficient documentation

## 2020-09-06 DIAGNOSIS — R1110 Vomiting, unspecified: Secondary | ICD-10-CM | POA: Diagnosis not present

## 2020-09-06 NOTE — Assessment & Plan Note (Signed)
Assessment: 60 y.o. female with symptoms consistent with a viral upper respiratory tract infection versus bacterial etiology of the long.  She does have coarse breath sounds on physical exam but is satting appropriately with O2 and overall appears well.  She states that the last time she had symptoms like this she was diagnosed with pneumonia and went to the ICU.  She has had cough, congestion, vomiting, and mild diarrhea.  Overall differential can include viral URI which can include common cold, influenza, COVID-19, or number of other respiratory viruses versus more serious bacterial infection such as bacterial pneumonia.  She has not had any fevers which is reassuring. Plan: -We will send for COVID-19 testing -We will get chest x-ray -Discussed return precautions -Discussed symptomatic treatment and the lack of need for antibiotics.

## 2020-09-06 NOTE — ED Provider Notes (Signed)
Emergency Medicine Provider Triage Evaluation Note  Bethany Beard , a 60 y.o. female  was evaluated in triage.  Pt complains of cough, congestion, body aches, diarrhea for the past few days.  Patient went to her PCP today and had a COVID test and was sent to the emergency room for a chest x-ray.  States that she did have viral pneumonia in the past and had to be admitted to the ICU, does have a history of asthma..  Review of Systems  Positive: Cough, congestion, body aches, diarrhea Negative: Shortness of breath  Physical Exam  BP (!) 186/87 (BP Location: Right Wrist)   Pulse 73   Temp 99 F (37.2 C) (Oral)   Resp (!) 22   SpO2 98%  Gen:   Awake, no distress   Resp:  Normal effort  MSK:   Moves extremities without difficulty  Other:  Mild expiratory wheezing, course bases   Medical Decision Making  Medically screening exam initiated at 4:32 PM.  Appropriate orders placed.  Bethany Beard was informed that the remainder of the evaluation will be completed by another provider, this initial triage assessment does not replace that evaluation, and the importance of remaining in the ED until their evaluation is complete.     Jeannie Fend, PA-C 09/06/20 1633    Blane Ohara, MD 09/07/20 [phone]

## 2020-09-06 NOTE — Addendum Note (Signed)
Addended by: Henri Medal on: 09/06/2020 03:15 PM   Modules accepted: Orders

## 2020-09-06 NOTE — Progress Notes (Signed)
    SUBJECTIVE:   CHIEF COMPLAINT / HPI:   Cough/congestion: Patient's a 60 year old female who presents today for cough and congestion.  She did do a rapid COVID test at the house which was negative.  She states started with cough a few days ago but has worsening. Feeling fatigued. No fevers, cough is not productive. Has thrown up 4-5x. She states she "gets like this" on occasion. Some diarrhea, 3x in last 24 hours.  She states that several years ago she felt like this and ended up having pneumonia which sent her to the ICU.  This is her main concern at this time.  PERTINENT  PMH / PSH: History of ICU stay due to pneumonia  OBJECTIVE:   BP 140/78    97% pulse ox and 60bpm  General: NAD, pleasant, able to participate in exam HEENT: No pharyngeal erythema Cardiac: RRR, no murmurs. Respiratory: No respiratory distress on room air with 97% O2 sat, coarse breath sounds throughout however with no end expiratory wheezing.  Breath sounds appear slightly worse on the left Abdomen: Bowel sounds present, nontender Skin: warm and dry, no rashes noted  ASSESSMENT/PLAN:   Respiratory tract congestion with cough Assessment: 60 y.o. female with symptoms consistent with a viral upper respiratory tract infection versus bacterial etiology of the long.  She does have coarse breath sounds on physical exam but is satting appropriately with O2 and overall appears well.  She states that the last time she had symptoms like this she was diagnosed with pneumonia and went to the ICU.  She has had cough, congestion, vomiting, and mild diarrhea.  Overall differential can include viral URI which can include common cold, influenza, COVID-19, or number of other respiratory viruses versus more serious bacterial infection such as bacterial pneumonia.  She has not had any fevers which is reassuring. Plan: -We will send for COVID-19 testing -We will get chest x-ray -Discussed return precautions -Discussed symptomatic  treatment and the lack of need for antibiotics.    Jackelyn Poling, DO [facility]    This note was prepared using Dragon voice recognition software and may include unintentional dictation errors due to the inherent limitations of voice recognition software.

## 2020-09-06 NOTE — ED Notes (Signed)
Patient said to registration that they are leaving. Moving OTF.

## 2020-09-06 NOTE — ED Triage Notes (Signed)
PT sent here by her pcp for cough.  Covid swab was ordered at Dr's office.

## 2020-09-06 NOTE — Patient Instructions (Addendum)
Today we are checking a COVID-19 test and I am ordering a chest x-ray.  Someone should call you with results of the chest x-ray in the next 1 to 2 days.  They recommended she go into the emergency department in order to get the testing because you have symptoms.  If you develop any shortness of breath, crushing chest pains, difficulty breathing, vomiting to the extent that she cannot keep down fluids, or any other concerning symptoms like this please go to the emergency department.

## 2020-09-07 LAB — SARS-COV-2, NAA 2 DAY TAT

## 2020-09-07 LAB — NOVEL CORONAVIRUS, NAA: SARS-CoV-2, NAA: NOT DETECTED

## 2020-09-09 ENCOUNTER — Telehealth: Payer: Self-pay

## 2020-09-09 NOTE — Telephone Encounter (Signed)
Patient calls nurse line requesting to discuss COVID results and results from recent CXR from 5/20.  Informed patient of negative COVID results. Patient has further questions regarding continued cough. Please advise if cough medication can be sent in for patient.   Veronda Prude, RN

## 2020-09-11 NOTE — Telephone Encounter (Signed)
Called patient to inform of below. Patient previously scheduled with PCP for tomorrow afternoon, however, is having to reschedule.   Patient requests follow up appointment for Tuesday afternoon. Scheduled patient on Tuesday 5/31.   Veronda Prude, RN

## 2020-09-12 ENCOUNTER — Ambulatory Visit: Payer: BC Managed Care – PPO | Admitting: Family Medicine

## 2020-09-17 ENCOUNTER — Ambulatory Visit (INDEPENDENT_AMBULATORY_CARE_PROVIDER_SITE_OTHER): Payer: BC Managed Care – PPO | Admitting: Family Medicine

## 2020-09-17 ENCOUNTER — Ambulatory Visit (HOSPITAL_COMMUNITY)
Admission: RE | Admit: 2020-09-17 | Discharge: 2020-09-17 | Disposition: A | Discharge status: Home / Self Care | Payer: BC Managed Care – PPO | Source: Ambulatory Visit | Attending: Family Medicine | Admitting: Family Medicine

## 2020-09-17 ENCOUNTER — Other Ambulatory Visit: Payer: Self-pay

## 2020-09-17 VITALS — BP 152/68 | HR 59 | Ht 67.0 in | Wt 281.2 lb

## 2020-09-17 DIAGNOSIS — J441 Chronic obstructive pulmonary disease with (acute) exacerbation: Secondary | ICD-10-CM | POA: Diagnosis not present

## 2020-09-17 DIAGNOSIS — R0789 Other chest pain: Secondary | ICD-10-CM

## 2020-09-17 DIAGNOSIS — J45901 Unspecified asthma with (acute) exacerbation: Secondary | ICD-10-CM | POA: Diagnosis not present

## 2020-09-17 MED ORDER — PREDNISONE 20 MG PO TABS: 40.0000 mg | ORAL_TABLET | Freq: Every day | ORAL | 0 refills | Status: AC

## 2020-09-17 NOTE — Progress Notes (Signed)
     SUBJECTIVE:   CHIEF COMPLAINT / HPI:   Bethany Beard is a 60 y.o. female presents for cough   Cough Primary symptom: productive cough which is green/brown Duration: 2 week, worse when coughing   Severity: improving since last week, not constant like before.  Associated symptoms:dyspnea feels like an allergy or asthma attack  Fever? Tmax?: no Sick contacts: none  Recent travel: none  Covid test: neg 11 days ago  Covid vaccination(s): full + boosted  CXR on 5/20: No acute cardiopulmonary disease. Pt with hx of PNA requiring ICU admission. 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 No recent abx use over last 3 months. Has been needing to take her albuterol inhaler more often 2-4 times a day. Pt is a smoker, smoked since age 20.   Chest tightness Central chest tightness which she noticed a few weeks ago. Non radiating. Exacerbated  by coughing or exertion sometimes. Relieved by sitting down for a few weeks. Denies of MI, CBAG.   Flowsheet Row Office Visit from 09/17/2020 in [facility]  PHQ-9 Total Score 12        PERTINENT  PMH / PSH: HTN, asthma, allergies  OBJECTIVE:   BP (!) 152/68   Pulse (!) 59   Ht 5\' 7"  (1.702 m)   Wt 281 lb 3.2 oz (127.6 kg)   SpO2 94%   BMI 44.04 kg/m    General: Alert, no acute distress Cardio: Normal S1 and S2, RRR, no r/m/g Pulm:  increased WOB and wheezing bilaterally Extremities: No peripheral edema.  Neuro: Cranial nerves grossly intact   ASSESSMENT/PLAN:   Asthma with COPD with exacerbation (HCC) Most likely asthma/COPD exacerbation. Pt was evaluated for COPD >20 years ago and reports she had "early COPD" then. She is not seen by pulmonology. Pt is still smoking making COPD likely. Scattered expiratory wheeze on exam today however saturating well on room air. Prescribed 5 day course of prednisone and recommended albuterol PRN. Referred to pulmonology for PFTs. Follow up with PCP if  persistent sx.  Chest tightness Most likely related to COPD exacerbation. It is sometimes related to exertion so cannot exclude angina. EKG: SR. Pt does have significant risk factors for CAD such as HTN, HLD, smoker, obesity so referred to cardiology.      , MD PGY-2 [facility]

## 2020-09-17 NOTE — Patient Instructions (Signed)
Thank you for coming to see me today. It was a pleasure. Today we discussed your cough which has not gone away. It could be a asthma/COPD exacerbation. I recommend a short course of steroids and continuing the albuterol inhaler.   I will refer you to a pulmonologist for pulmonary function tests. I will also refer you to cardiology as your chest tightness could be caused by heart disease.  Please follow-up with me with your PCP in 2 weeks time.   If you have any questions or concerns, please do not hesitate to call the office at [phone].  Best wishes,   Dr Allena Katz

## 2020-09-17 NOTE — Assessment & Plan Note (Addendum)
Most likely asthma/COPD exacerbation. Pt was evaluated for COPD >20 years ago and reports she had "early COPD" then. She is not seen by pulmonology. Pt is still smoking making COPD likely. Scattered expiratory wheeze on exam today however saturating well on room air. Prescribed 5 day course of prednisone and recommended albuterol PRN. Referred to pulmonology for PFTs. Follow up with PCP if persistent sx.

## 2020-09-24 ENCOUNTER — Other Ambulatory Visit: Payer: Self-pay | Admitting: *Deleted

## 2020-09-24 MED ORDER — CELECOXIB 100 MG PO CAPS: ORAL_CAPSULE | ORAL | 0 refills | Status: DC

## 2020-09-30 DIAGNOSIS — R0789 Other chest pain: Secondary | ICD-10-CM | POA: Insufficient documentation

## 2020-09-30 NOTE — Assessment & Plan Note (Addendum)
Most likely related to COPD exacerbation. It is sometimes related to exertion so cannot exclude angina. EKG: SR. Pt does have significant risk factors for CAD such as HTN, HLD, smoker, obesity so referred to cardiology.

## 2020-10-04 ENCOUNTER — Ambulatory Visit: Payer: BC Managed Care – PPO | Admitting: Family Medicine

## 2020-10-09 ENCOUNTER — Ambulatory Visit (INDEPENDENT_AMBULATORY_CARE_PROVIDER_SITE_OTHER): Payer: BC Managed Care – PPO | Admitting: Family Medicine

## 2020-10-09 ENCOUNTER — Encounter: Payer: Self-pay | Admitting: Family Medicine

## 2020-10-09 ENCOUNTER — Other Ambulatory Visit: Payer: Self-pay

## 2020-10-09 VITALS — BP 140/68 | HR 60 | Ht 67.0 in | Wt 277.8 lb

## 2020-10-09 DIAGNOSIS — J309 Allergic rhinitis, unspecified: Secondary | ICD-10-CM

## 2020-10-09 DIAGNOSIS — G8929 Other chronic pain: Secondary | ICD-10-CM

## 2020-10-09 DIAGNOSIS — M6740 Ganglion, unspecified site: Secondary | ICD-10-CM

## 2020-10-09 DIAGNOSIS — M25561 Pain in right knee: Secondary | ICD-10-CM

## 2020-10-09 DIAGNOSIS — E039 Hypothyroidism, unspecified: Secondary | ICD-10-CM

## 2020-10-09 DIAGNOSIS — I10 Essential (primary) hypertension: Secondary | ICD-10-CM

## 2020-10-09 DIAGNOSIS — M25562 Pain in left knee: Secondary | ICD-10-CM

## 2020-10-09 DIAGNOSIS — R058 Other specified cough: Secondary | ICD-10-CM | POA: Diagnosis not present

## 2020-10-09 MED ORDER — LEVOTHYROXINE SODIUM 100 MCG PO TABS: 100.0000 ug | ORAL_TABLET | ORAL | 3 refills | Status: DC

## 2020-10-09 MED ORDER — CELECOXIB 100 MG PO CAPS: 100.0000 mg | ORAL_CAPSULE | Freq: Two times a day (BID) | ORAL | 2 refills | Status: DC

## 2020-10-09 MED ORDER — FEXOFENADINE HCL 180 MG PO TABS: 180.0000 mg | ORAL_TABLET | Freq: Every day | ORAL | Status: DC

## 2020-10-09 MED ORDER — AMLODIPINE BESYLATE 5 MG PO TABS: 5.0000 mg | ORAL_TABLET | Freq: Every day | ORAL | 0 refills | Status: DC

## 2020-10-09 NOTE — Assessment & Plan Note (Signed)
Had aspirated by Sports Medicine.  Indicated returned day after having aspirated.  Now requesting surgical removal.   - Refer to Orthopedic Surgery

## 2020-10-09 NOTE — Assessment & Plan Note (Signed)
Patient currently taking Zyrtec daily.  Feels that it has lost it's efficaciousness - Stop Zyrtec - Start Allegra 180 mg daily

## 2020-10-09 NOTE — Progress Notes (Signed)
SUBJECTIVE:   CHIEF COMPLAINT / HPI: Follow-up  Patient following up today on blood pressure and Hypothyroidism.  Has multiple additional problems wanting to discuss.  Patient referred to Sports Medicine for ganglionic cyst of left wrist.  Had aspirated, but indicated reappeared the following day.    Patient continues to have subacute cough.  Indicates short course of steroids helped but cough returned afterwards.  Recently switched from Smoking to Vaping and interested in quitting altogether.  Patient indicates she has taken Zyrtec sometime for allergies.  Indicates she does not feel it is affective as it used to be.    Patient also complaining of lump in pubic area.  Indicates is painful but no rash or itchiness.  Indicates was previously more swollen but this has decreased.  Had previous single vaginal birth.  Previous abdominal cholecystectomy and subsequent bowel surgery.  No history of c-section or hysterectomy.       PERTINENT  PMH / PSH: Cholecystectomy  OBJECTIVE:   BP 140/68   Pulse 60   Ht 5\' 7"  (1.702 m)   Wt 277 lb 12.8 oz (126 kg)   SpO2 94%   BMI 43.51 kg/m    Physical Exam Exam conducted with a chaperone present.  HENT:     Head: Normocephalic and atraumatic.     Mouth/Throat:     Mouth: Mucous membranes are moist.  Cardiovascular:     Rate and Rhythm: Normal rate and regular rhythm.  Pulmonary:     Effort: Pulmonary effort is normal.     Breath sounds: Normal breath sounds.  Abdominal:     General: Abdomen is flat. There is no distension.     Palpations: Abdomen is soft.     Hernia: No hernia is present. There is no hernia in the ventral area, left inguinal area or right inguinal area.     Comments: Small subdermal suprapubic solid 1 cm mass  Musculoskeletal:     Left wrist: Deformity present.     Comments: Cyst on ventral left wrist, superficial to radial artery  Neurological:     General: No focal deficit present.     Mental Status: She is  alert.     ASSESSMENT/PLAN:   Hypertension Patient blood pressure had multiple blood pressures into 140's-150's.  Orthostatic vitals also elevated. - Start on 5 mg Amlodipine - follow up lipid panel in future  Allergic rhinitis Patient currently taking Zyrtec daily.  Feels that it has lost it's efficaciousness - Stop Zyrtec - Start Allegra 180 mg daily  Respiratory tract congestion with cough Patient indicates has history of asthma.  Has also been smoking for many years.  Recently transitioned to vaping but is interested in quitting completely.  Praised for efforts taken so far and desire for cessation.  Symptoms likely due to COPD.  Has appointment scheduled with Pulmonology.   - follow up with Pulm for further assessment - discuss quitting further in 1 month and possible Chantix vs Buproprion   Hypothyroidism Currently on 88 mcg of Synthroid.  Given previously elevated TSH, plan to increase Synthroid dose. - Increase to 100 mcg Synthroid - Follow up TSH in 1 month  Knee pain Patient previously given Celebrex course following Sports Medicine.  Indicates controlled her pain very well.  No history of any heart problems. - Prescribed Celebrex 100 mg BID  Ganglion cyst Had aspirated by Sports Medicine.  Indicated returned day after having aspirated.  Now requesting surgical removal.   - Refer to Orthopedic  Surgery   Follow-up at next visit in 1 month - Smoking cessation - Suprapubic mass, likely lipoma, though return sooner if concerning findings, growing larger, or worsening pain  Jovita Kussmaul, MD [facility]

## 2020-10-09 NOTE — Assessment & Plan Note (Signed)
Patient blood pressure had multiple blood pressures into 140's-150's.  Orthostatic vitals also elevated. - Start on 5 mg Amlodipine - follow up lipid panel in future

## 2020-10-09 NOTE — Patient Instructions (Addendum)
It was good to see you today.  Thank you for coming in.  I tam starting you on a medication for your blood pressure called Amlodipine 5 mg.  I am also increasing your Synthroid to 100 mg.    I have placed a referral to Orthopedic Surgery for the cyst on your wrist.  I am also ordering Celecoxib 100 mg to be taken twice a day as needed for your joint pain.  We can follow-up on your other issues in 1 month.  Please come see me again in 1 month.  Be Well, Dr Pecola Leisure

## 2020-10-09 NOTE — Assessment & Plan Note (Signed)
Currently on 88 mcg of Synthroid.  Given previously elevated TSH, plan to increase Synthroid dose. - Increase to 100 mcg Synthroid - Follow up TSH in 1 month

## 2020-10-09 NOTE — Assessment & Plan Note (Signed)
Patient previously given Celebrex course following Sports Medicine.  Indicates controlled her pain very well.  No history of any heart problems. - Prescribed Celebrex 100 mg BID

## 2020-10-09 NOTE — Assessment & Plan Note (Signed)
Patient indicates has history of asthma.  Has also been smoking for many years.  Recently transitioned to vaping but is interested in quitting completely.  Praised for efforts taken so far and desire for cessation.  Symptoms likely due to COPD.  Has appointment scheduled with Pulmonology.   - follow up with Pulm for further assessment - discuss quitting further in 1 month and possible Chantix vs Buproprion

## 2020-10-16 ENCOUNTER — Ambulatory Visit: Payer: Self-pay

## 2020-10-16 ENCOUNTER — Encounter: Payer: Self-pay | Admitting: Orthopaedic Surgery

## 2020-10-16 ENCOUNTER — Ambulatory Visit (INDEPENDENT_AMBULATORY_CARE_PROVIDER_SITE_OTHER): Payer: BC Managed Care – PPO | Admitting: Orthopaedic Surgery

## 2020-10-16 DIAGNOSIS — M67432 Ganglion, left wrist: Secondary | ICD-10-CM | POA: Diagnosis not present

## 2020-10-16 NOTE — Progress Notes (Signed)
Office Visit Note   Patient: Bethany Beard           Date of Birth: [birth year]           MRN: [id number] Visit Date: 10/16/2020              Requested by: Latrelle Dodrill, MD [address] [place],  [place] [address] PCP: Jovita Kussmaul, MD   Assessment & Plan: Visit Diagnoses:  1. Ganglion cyst of volar aspect of left wrist     Plan: Impression is left volar wrist ganglion cyst that has failed aspiration.  Based on treatment options the patient elects to move forward with surgical excision.  She is aware of the potential risks of recurrence, neurovascular injury particularly of the radial artery and periincisional numbness hands scarring.  All questions answered.  We look forward to treating her in the operating room.  Follow-Up Instructions: Return for postop 1 week.   Orders:  Orders Placed This Encounter  Procedures   XR Wrist Complete Left   No orders of the defined types were placed in this encounter.     Procedures: No procedures performed   Clinical Data: No additional findings.   Subjective: Chief Complaint  Patient presents with   Left Wrist - Pain    Patient is a 60 year old female referral from the [facility] sports medicine clinic for evaluation of a symptomatic left volar wrist ganglion cyst.  She has had this for about 4 to 5 months.  This was aspirated back in April under ultrasound and it recurred the very next day.  She has some pain that bothers her with daily activities.  She works at [employer] and it hurts when it gets bumped.  Denies any numbness and tingling.  She endorses some radiation of pain up the forearm and down into the hand.   Review of Systems  Constitutional: Negative.   HENT: Negative.    Eyes: Negative.   Respiratory: Negative.    Cardiovascular: Negative.   Endocrine: Negative.   Musculoskeletal: Negative.   Neurological: Negative.   Hematological: Negative.   Psychiatric/Behavioral: Negative.    All  other systems reviewed and are negative.   Objective: Vital Signs: There were no vitals taken for this visit.  Physical Exam Vitals and nursing note reviewed.  Constitutional:      Appearance: She is well-developed.  HENT:     Head: Normocephalic and atraumatic.  Pulmonary:     Effort: Pulmonary effort is normal.  Abdominal:     Palpations: Abdomen is soft.  Musculoskeletal:     Cervical back: Neck supple.  Skin:    General: Skin is warm.     Capillary Refill: Capillary refill takes less than 2 seconds.  Neurological:     Mental Status: She is alert and oriented to person, place, and time.  Psychiatric:        Behavior: Behavior normal.        Thought Content: Thought content normal.        Judgment: Judgment normal.    Ortho Exam Left wrist shows a volar ganglion cyst radial to the FCR tendon.  Strong radial pulse adjacent to it.  No neurovascular compromise to the hand.  This transilluminates with light.  Semimobile. Specialty Comments:  No specialty comments available.  Imaging: XR Wrist Complete Left  Result Date: 10/16/2020 No acute or structural abnormalities.    PMFS History: Patient Active Problem List   Diagnosis Date Noted   Chest  tightness 09/30/2020   Asthma with COPD with exacerbation (HCC) 09/17/2020   Respiratory tract congestion with cough 09/06/2020   Hypertension 08/06/2020   Knee pain 08/06/2020   Ganglion cyst 06/24/2020   Other abnormal glucose 03/09/2012   Eczema, dyshidrotic 01/04/2012   ANA POSITIVE 09/05/2008   HYPERCHOLESTEROLEMIA 04/16/2008   MIGRAINE HEADACHE 01/31/2008   GERD 01/31/2008   Hypothyroidism 01/11/2008   OBESITY 01/11/2008   TOBACCO ABUSE 01/11/2008   Allergic rhinitis 01/11/2008   ASTHMA 01/11/2008   LOW BACK PAIN, CHRONIC 01/11/2008   MDD (major depressive disorder) 04/20/2005   Past Medical History:  Diagnosis Date   Asthma    Depression    Migraines    Pneumonia    Thyroid disease     Family History   Adopted: Yes  Problem Relation Age of Onset   Cancer Neg Hx    Diabetes Neg Hx    Hyperlipidemia Neg Hx    Hypertension Neg Hx    Kidney disease Neg Hx    Stroke Neg Hx     Past Surgical History:  Procedure Laterality Date   BACK SURGERY     carpel     CHOLECYSTECTOMY     TONSILLECTOMY     Social History   Occupational History   Not on file  Tobacco Use   Smoking status: Every Day    Pack years: 0.00    Types: Cigarettes   Smokeless tobacco: Never  Vaping Use   Vaping Use: Never used  Substance and Sexual Activity   Alcohol use: No    Comment: somtimes   Drug use: No   Sexual activity: Never

## 2020-10-23 ENCOUNTER — Other Ambulatory Visit: Payer: Self-pay | Admitting: Physician Assistant

## 2020-10-23 MED ORDER — HYDROCODONE-ACETAMINOPHEN 5-325 MG PO TABS: 1.0000 | ORAL_TABLET | Freq: Three times a day (TID) | ORAL | 0 refills | Status: DC | PRN

## 2020-10-23 MED ORDER — ONDANSETRON HCL 4 MG PO TABS: 4.0000 mg | ORAL_TABLET | Freq: Three times a day (TID) | ORAL | 0 refills | Status: DC | PRN

## 2020-10-24 ENCOUNTER — Encounter: Payer: Self-pay | Admitting: Orthopaedic Surgery

## 2020-10-24 ENCOUNTER — Other Ambulatory Visit: Payer: Self-pay

## 2020-10-24 DIAGNOSIS — M67432 Ganglion, left wrist: Secondary | ICD-10-CM | POA: Diagnosis not present

## 2020-10-31 ENCOUNTER — Encounter: Payer: Self-pay | Admitting: Orthopaedic Surgery

## 2020-10-31 ENCOUNTER — Ambulatory Visit (INDEPENDENT_AMBULATORY_CARE_PROVIDER_SITE_OTHER): Payer: BC Managed Care – PPO | Admitting: Physician Assistant

## 2020-10-31 DIAGNOSIS — M67432 Ganglion, left wrist: Secondary | ICD-10-CM | POA: Diagnosis not present

## 2020-10-31 NOTE — Progress Notes (Signed)
   Post-Op Visit Note   Patient: Bethany Beard           Date of Birth: [birth year]           MRN: [id number] Visit Date: 10/31/2020 PCP: Jovita Kussmaul, MD   Assessment & Plan:  Chief Complaint:  Chief Complaint  Patient presents with   Left Wrist - Pain   Visit Diagnoses:  1. Ganglion cyst of volar aspect of left wrist     Plan: Patient is a pleasant 60 year old female who comes in today 1 week out left volar ganglion cyst removal, date of surgery 10/24/2020.  She has been doing well.  She is taking Tylenol for pain.  Examination of her wrist reveals a well-healing surgical incision with nylon sutures in place.  No evidence of infection or cellulitis.  Fingers are warm and well-perfused.  Today, the wound was cleaned and recovered.  She was placed in a Velcro splint.  No heavy lifting or submerging her hand underwater for another 3 weeks.  She would like to return to work at [employer].  I have provided her with a work note to do so but no lifting of the left upper extremity.  Follow-up with [place] next week for probable suture removal.  Call with concerns or questions in the meantime.  Follow-Up Instructions: Return in about 1 week (around 11/07/2020).   Orders:  No orders of the defined types were placed in this encounter.  No orders of the defined types were placed in this encounter.   Imaging: No new imaging  PMFS History: Patient Active Problem List   Diagnosis Date Noted   Chest tightness 09/30/2020   Asthma with COPD with exacerbation (HCC) 09/17/2020   Respiratory tract congestion with cough 09/06/2020   Hypertension 08/06/2020   Knee pain 08/06/2020   Ganglion cyst of volar aspect of left wrist 06/24/2020   Other abnormal glucose 03/09/2012   Eczema, dyshidrotic 01/04/2012   ANA POSITIVE 09/05/2008   HYPERCHOLESTEROLEMIA 04/16/2008   MIGRAINE HEADACHE 01/31/2008   GERD 01/31/2008   Hypothyroidism 01/11/2008   OBESITY 01/11/2008   TOBACCO ABUSE 01/11/2008    Allergic rhinitis 01/11/2008   ASTHMA 01/11/2008   LOW BACK PAIN, CHRONIC 01/11/2008   MDD (major depressive disorder) 04/20/2005   Past Medical History:  Diagnosis Date   Asthma    Depression    Migraines    Pneumonia    Thyroid disease     Family History  Adopted: Yes  Problem Relation Age of Onset   Cancer Neg Hx    Diabetes Neg Hx    Hyperlipidemia Neg Hx    Hypertension Neg Hx    Kidney disease Neg Hx    Stroke Neg Hx     Past Surgical History:  Procedure Laterality Date   BACK SURGERY     carpel     CHOLECYSTECTOMY     TONSILLECTOMY     Social History   Occupational History   Not on file  Tobacco Use   Smoking status: Every Day    Types: Cigarettes   Smokeless tobacco: Never  Vaping Use   Vaping Use: Never used  Substance and Sexual Activity   Alcohol use: No    Comment: somtimes   Drug use: No   Sexual activity: Never

## 2020-11-05 ENCOUNTER — Other Ambulatory Visit: Payer: Self-pay

## 2020-11-05 ENCOUNTER — Other Ambulatory Visit: Payer: Self-pay | Admitting: Family Medicine

## 2020-11-05 ENCOUNTER — Encounter: Payer: Self-pay | Admitting: Pulmonary Disease

## 2020-11-05 ENCOUNTER — Ambulatory Visit (INDEPENDENT_AMBULATORY_CARE_PROVIDER_SITE_OTHER): Payer: BC Managed Care – PPO | Admitting: Pulmonary Disease

## 2020-11-05 VITALS — BP 132/88 | HR 59 | Ht 67.0 in | Wt 286.0 lb

## 2020-11-05 DIAGNOSIS — M7989 Other specified soft tissue disorders: Secondary | ICD-10-CM | POA: Diagnosis not present

## 2020-11-05 DIAGNOSIS — R0600 Dyspnea, unspecified: Secondary | ICD-10-CM | POA: Diagnosis not present

## 2020-11-05 DIAGNOSIS — J4540 Moderate persistent asthma, uncomplicated: Secondary | ICD-10-CM

## 2020-11-05 DIAGNOSIS — R0609 Other forms of dyspnea: Secondary | ICD-10-CM

## 2020-11-05 MED ORDER — BUDESONIDE-FORMOTEROL FUMARATE 160-4.5 MCG/ACT IN AERO: 2.0000 | INHALATION_SPRAY | Freq: Two times a day (BID) | RESPIRATORY_TRACT | 11 refills | Status: DC

## 2020-11-05 MED ORDER — MONTELUKAST SODIUM 10 MG PO TABS: 10.0000 mg | ORAL_TABLET | Freq: Every day | ORAL | 11 refills | Status: DC

## 2020-11-05 NOTE — Patient Instructions (Addendum)
Nice to meet you  Use symbicort 2 puffs twice a day, every day. Rinse out your mouth after every use.   Use albuterol as needed  New pill called singulair, use every night to help with nasal congestion and cough.  I ordered an ultrasound of your leg to make sure no clot, or if one is present we need to know.  Return to clinic in 3 months or sooner as needed

## 2020-11-07 ENCOUNTER — Encounter: Payer: Self-pay | Admitting: Orthopaedic Surgery

## 2020-11-07 ENCOUNTER — Ambulatory Visit (INDEPENDENT_AMBULATORY_CARE_PROVIDER_SITE_OTHER): Payer: BC Managed Care – PPO | Admitting: Orthopaedic Surgery

## 2020-11-07 DIAGNOSIS — M67432 Ganglion, left wrist: Secondary | ICD-10-CM

## 2020-11-07 NOTE — Progress Notes (Signed)
   Post-Op Visit Note   Patient: Bethany Beard           Date of Birth: 06-10-1960           MRN: [id number] Visit Date: 11/07/2020 PCP: Jovita Kussmaul, MD   Assessment & Plan:  Chief Complaint:  Chief Complaint  Patient presents with   Left Wrist - Pain   Visit Diagnoses:  1. Ganglion cyst of volar aspect of left wrist     Plan: Patient is to week status post left volar wrist ganglion cyst removal.  She is doing well has no complaints.  No numbness or tingling.  Surgical incision is healed.  Strong radial pulse.  Capillary refill normal.  No sensory deficits.  At this point we will take out the sutures.  She should mobilize the wrist for another 2 weeks for a full month and then she may take the brace off and advance activity as tolerated.  Follow-up as needed.  Follow-Up Instructions: No follow-ups on file.   Orders:  No orders of the defined types were placed in this encounter.  No orders of the defined types were placed in this encounter.   Imaging: No results found.  PMFS History: Patient Active Problem List   Diagnosis Date Noted   Chest tightness 09/30/2020   Asthma with COPD with exacerbation (HCC) 09/17/2020   Respiratory tract congestion with cough 09/06/2020   Hypertension 08/06/2020   Knee pain 08/06/2020   Ganglion cyst of volar aspect of left wrist 06/24/2020   Other abnormal glucose 03/09/2012   Eczema, dyshidrotic 01/04/2012   ANA POSITIVE 09/05/2008   HYPERCHOLESTEROLEMIA 04/16/2008   MIGRAINE HEADACHE 01/31/2008   GERD 01/31/2008   Hypothyroidism 01/11/2008   OBESITY 01/11/2008   TOBACCO ABUSE 01/11/2008   Allergic rhinitis 01/11/2008   ASTHMA 01/11/2008   LOW BACK PAIN, CHRONIC 01/11/2008   MDD (major depressive disorder) 04/20/2005   Past Medical History:  Diagnosis Date   Asthma    Depression    Migraines    Pneumonia    Thyroid disease     Family History  Adopted: Yes  Problem Relation Age of Onset   Cancer Neg Hx     Diabetes Neg Hx    Hyperlipidemia Neg Hx    Hypertension Neg Hx    Kidney disease Neg Hx    Stroke Neg Hx     Past Surgical History:  Procedure Laterality Date   BACK SURGERY     carpel     CHOLECYSTECTOMY     TONSILLECTOMY     Social History   Occupational History   Not on file  Tobacco Use   Smoking status: Every Day    Types: Cigarettes   Smokeless tobacco: Never  Vaping Use   Vaping Use: Never used  Substance and Sexual Activity   Alcohol use: No    Comment: somtimes   Drug use: No   Sexual activity: Never

## 2020-11-08 ENCOUNTER — Ambulatory Visit (HOSPITAL_COMMUNITY)
Admission: RE | Admit: 2020-11-08 | Discharge: 2020-11-08 | Disposition: A | Discharge status: Home / Self Care | Payer: BC Managed Care – PPO | Source: Ambulatory Visit | Attending: Cardiovascular Disease | Admitting: Cardiovascular Disease

## 2020-11-08 ENCOUNTER — Other Ambulatory Visit: Payer: Self-pay

## 2020-11-08 DIAGNOSIS — M7989 Other specified soft tissue disorders: Secondary | ICD-10-CM | POA: Diagnosis not present

## 2020-11-10 NOTE — Progress Notes (Signed)
No blood clot in the legs to explain L>R swelling.

## 2020-11-12 ENCOUNTER — Ambulatory Visit: Payer: BC Managed Care – PPO | Admitting: Family Medicine

## 2020-11-13 ENCOUNTER — Encounter: Payer: Self-pay | Admitting: *Deleted

## 2020-11-17 NOTE — Progress Notes (Signed)
Cardiology Office Note:   Date:  11/19/2020  NAME:  Bethany PlowmanKandee Tangney    MRN: [id number] DOB:  1960-11-24   PCP:  Jovita KussmaulManess, Philip, MD  Cardiologist:  None  Electrophysiologist:  None   Referring MD: Moses MannersHensel, William A, MD   Chief Complaint  Patient presents with   Chest Pain    History of Present Illness:   Bethany PlowmanKandee Emile is a 60 y.o. female with a hx of hypothyroidism, arthritis, asthma, tobacco abuse who is being seen today for the evaluation of chest pain at the request of Jovita KussmaulManess, Philip, MD. she reports she has had on and off chest tightness for years.  She reports symptoms occur every few weeks.  Notify with trigger.  Symptoms can last 30 minutes.  She reports they resolve without intervention.  They do not appear to be exertional.  They are not alleviated by rest.  She does report stress can precipitate her symptoms.  Her EKG demonstrates sinus bradycardia with no acute ischemic changes or evidence of infarction.  Her CVD risk factors include hypertension.  She was recently started on Norvasc.  She has had some swelling in her legs with this.  She also has hypothyroidism.  Her most recent TSH is 14.8.  She reports low energy and fatigue.  She does snore.  Likely has sleep apnea.  She has many moving parts with her elevated TSH.  She has never had a heart attack or stroke.  She is a tobacco abuser but recently quit smoking.  Nearly 45 pack years reported.  She reports she works at [employer].  She presents with her daughter today.  She is morbidly obese with a BMI of 46.  She is not diabetic.  Family history is uncertain as she is adopted.  She reports she also gets short of breath with exertion.  Recently underwent lower extremity ultrasounds of her legs given swelling in the left leg.  DVT study negative.  She had evidence of venous insufficiency.  Problem List HTN Tobacco abuse Asthma HLD -T chol 274, HDL 68, LDL 171, TG 192  Past Medical History: Past Medical History:  Diagnosis Date    Asthma    Depression    Hyperlipidemia    Migraines    Pneumonia    Thyroid disease     Past Surgical History: Past Surgical History:  Procedure Laterality Date   BACK SURGERY     carpel     CHOLECYSTECTOMY     TONSILLECTOMY      Current Medications: Current Meds  Medication Sig   acetaminophen (TYLENOL) 500 MG tablet Take 2,000 mg by mouth every 6 (six) hours as needed for moderate pain.   budesonide-formoterol (SYMBICORT) 160-4.5 MCG/ACT inhaler Inhale 2 puffs into the lungs 2 (two) times daily.   celecoxib (CELEBREX) 100 MG capsule Take 1 capsule (100 mg total) by mouth 2 (two) times daily.   citalopram (CELEXA) 20 MG tablet Take 1 tablet (20 mg total) by mouth daily.   fexofenadine (ALLEGRA ALLERGY) 180 MG tablet Take 1 tablet (180 mg total) by mouth daily.   HYDROcodone-acetaminophen (NORCO) 5-325 MG tablet Take 1 tablet by mouth 3 (three) times daily as needed.   levothyroxine (SYNTHROID) 100 MCG tablet Take 1 tablet (100 mcg total) by mouth every morning. 30 minutes before food   losartan (COZAAR) 25 MG tablet Take 1 tablet (25 mg total) by mouth daily.   Menthol-Methyl Salicylate (BEN GAY GREASELESS) 10-15 % greaseless cream Apply 1 application topically 3 (three) times  daily as needed for pain.   metoprolol tartrate (LOPRESSOR) 25 MG tablet TAKE 2 HOURS PRIOR TO CT SCAN   montelukast (SINGULAIR) 10 MG tablet Take 1 tablet (10 mg total) by mouth at bedtime.   ondansetron (ZOFRAN) 4 MG tablet Take 1 tablet (4 mg total) by mouth every 8 (eight) hours as needed for nausea or vomiting.   [DISCONTINUED] amLODipine (NORVASC) 5 MG tablet TAKE 1 TABLET BY MOUTH AT BEDTIME     Allergies:    Aspirin, Avocado, Codeine, Tomato, and Strawberry extract   Social History: Social History   Socioeconomic History   Marital status: Widowed    Spouse name: Not on file   Number of children: 1   Years of education: Not on file   Highest education level: Not on file  Occupational  History   Occupation: [employer]  Tobacco Use   Smoking status: Every Day    Packs/day: 1.00    Years: 45.00    Pack years: 45.00    Types: Cigarettes   Smokeless tobacco: Never  Vaping Use   Vaping Use: Never used  Substance and Sexual Activity   Alcohol use: No    Comment: somtimes   Drug use: No   Sexual activity: Never  Other Topics Concern   Not on file  Social History Narrative   Not on file   Social Determinants of Health   Financial Resource Strain: Not on file  Food Insecurity: Not on file  Transportation Needs: Not on file  Physical Activity: Not on file  Stress: Not on file  Social Connections: Not on file     Family History: The patient's family history is negative for Cancer, Diabetes, Hyperlipidemia, Hypertension, Kidney disease, and Stroke. She was adopted.  ROS:   All other ROS reviewed and negative. Pertinent positives noted in the HPI.     EKGs/Labs/Other Studies Reviewed:   The following studies were personally reviewed by me today:  EKG:  EKG is ordered today.  The ekg ordered today demonstrates sinus bradycardia heart rate 57, no acute ischemic changes or evidence of infarction, and was personally reviewed by me.   Recent Labs: 06/24/2020: ALT 13; BUN 14; Creatinine, Ser 0.92; Hemoglobin 12.1; Platelets 252; Potassium 4.2; Sodium 140 08/06/2020: TSH 14.800   Recent Lipid Panel    Component Value Date/Time   CHOL 274 (H) 06/24/2020 1059   TRIG 192 (H) 06/24/2020 1059   HDL 68 06/24/2020 1059   CHOLHDL 4.0 06/24/2020 1059   CHOLHDL 4 01/04/2012 1530   VLDL 24.8 01/04/2012 1530   LDLCALC 171 (H) 06/24/2020 1059   LDLDIRECT 207.2 01/04/2012 1530    Physical Exam:   VS:  BP (!) 158/70   Pulse (!) 57   Resp 20   Ht 5\' 6"  (1.676 m)   Wt 285 lb 12.8 oz (129.6 kg)   SpO2 94%   BMI 46.13 kg/m    Wt Readings from Last 3 Encounters:  11/19/20 285 lb 12.8 oz (129.6 kg)  11/05/20 286 lb (129.7 kg)  10/09/20 277 lb 12.8 oz (126 kg)     General: Well nourished, well developed, in no acute distress Head: Atraumatic, normal size  Eyes: PEERLA, EOMI  Neck: Supple, no JVD Endocrine: No thryomegaly Cardiac: Normal S1, S2; RRR; no murmurs, rubs, or gallops Lungs: Clear to auscultation bilaterally, no wheezing, rhonchi or rales  Abd: Soft, nontender, no hepatomegaly  Ext: No edema, pulses 2+ Musculoskeletal: No deformities, BUE and BLE strength normal and equal Skin:  Warm and dry, no rashes   Neuro: Alert and oriented to person, place, time, and situation, CNII-XII grossly intact, no focal deficits  Psych: Normal mood and affect   ASSESSMENT:   Mirissa Lopresti is a 60 y.o. female who presents for the following: 1. Chest pain, unspecified type   2. Obesity, morbid, BMI 40.0-49.9 (HCC)   3. Venous insufficiency   4. Primary hypertension   5. Tobacco abuse   6. Precordial pain    PLAN:   1. Chest pain, unspecified type -Atypical chest pain.  Occurred intermittently for years.  EKG without acute ischemic changes.  CVD risk factors include morbid obesity, hypertension hyperlipidemia.  I have recommended coronary CTA for further evaluation.  She will take 25 mg metoprolol tartrate twice before the scan.  We will need a kidney profile.  She does have some lower extremity edema but this appears to be related to venous insufficiency.  I also question Norvasc.  We will get an echocardiogram to make sure cardiac function is normal. -She also reports symptoms of fatigue.  TSH is elevated.  Needs to follow with primary. -We also discussed possibility of sleep apnea.  We will consider sleep study at her next appointment.  2. Obesity, morbid, BMI 40.0-49.9 (HCC) -Diet and exercise recommended.  3. Venous insufficiency -Evidence of venous insufficiency on recent lower extremity ultrasound.  No DVT.  Left greater than right probably due to trauma to her left leg in the past.  I have recommended compression stockings.  We will stop  amlodipine as this can cause swelling in the legs.  Switch to losartan 25 mg daily.  4. Primary hypertension -Stop Norvasc due to lower extreme edema.  Start losartan 25 mg daily.  5. Tobacco abuse -Recently quit smoking.  Cessation encouraged.  Disposition: Return in about 4 months (around 03/21/2021).  Medication Adjustments/Labs and Tests Ordered: Current medicines are reviewed at length with the patient today.  Concerns regarding medicines are outlined above.  Orders Placed This Encounter  Procedures   CT CORONARY MORPH W/CTA COR W/SCORE W/CA W/CM &/OR WO/CM   Basic Metabolic Panel (BMET)   TSH   EKG 12-Lead   ECHOCARDIOGRAM COMPLETE    Meds ordered this encounter  Medications   losartan (COZAAR) 25 MG tablet    Sig: Take 1 tablet (25 mg total) by mouth daily.    Dispense:  90 tablet    Refill:  3   metoprolol tartrate (LOPRESSOR) 25 MG tablet    Sig: TAKE 2 HOURS PRIOR TO CT SCAN    Dispense:  1 tablet    Refill:  0   Patient Instructions  Medication Instructions:   STOP AMLODIPINE  START LOSARTAN 25 MG ONCE DAILY  *If you need a refill on your cardiac medications before your next appointment, please call your pharmacy*   Lab Work:  Your physician recommends that you return for lab work PRIOR TO CT SCAN  If you have labs (blood work) drawn today and your tests are completely normal, you will receive your results only by: MyChart Message (if you have MyChart) OR A paper copy in the mail If you have any lab test that is abnormal or we need to change your treatment, we will call you to review the results.   Testing/Procedures:  Your physician has requested that you have an echocardiogram. Echocardiography is a painless test that uses sound waves to create images of your heart. It provides your doctor with information about the size and shape  of your heart and how well your heart's chambers and valves are working. This procedure takes approximately one hour.  There are no restrictions for this procedure. [address]     Your cardiac CT will be scheduled at   [facility] [address] [place], [place] [address] [phone]   If scheduled at [facility], please arrive at the [facility] main entrance (entrance A) of [facility] 30 minutes prior to test start time. Proceed to the [facility] Radiology Department (first floor) to check-in and test prep.   Please follow these instructions carefully (unless otherwise directed):   On the Night Before the Test: Be sure to Drink plenty of water. Do not consume any caffeinated/decaffeinated beverages or chocolate 12 hours prior to your test. Do not take any antihistamines 12 hours prior to your test.   On the Day of the Test: Drink plenty of water until 1 hour prior to the test. Do not eat any food 4 hours prior to the test. You may take your regular medications prior to the test.  Take metoprolol (Lopressor) 25 MG two hours prior to test. HOLD Furosemide/Hydrochlorothiazide morning of the test. FEMALES- please wear underwire-free bra if available, avoid dresses & tight clothing       After the Test: Drink plenty of water. After receiving IV contrast, you may experience a mild flushed feeling. This is normal. On occasion, you may experience a mild rash up to 24 hours after the test. This is not dangerous. If this occurs, you can take Benadryl 25 mg and increase your fluid intake. If you experience trouble breathing, this can be serious. If it is severe call 911 IMMEDIATELY. If it is mild, please call our office. If you take any of these medications: Glipizide/Metformin, Avandament, Glucavance, please do not take 48 hours after completing test unless otherwise instructed.  Please allow 2-4 weeks for scheduling of routine cardiac CTs. Some insurance companies require a pre-authorization which may delay scheduling of this test.   For  non-scheduling related questions, please contact the cardiac imaging nurse navigator should you have any questions/concerns: Rockwell Alexandria, Cardiac Imaging Nurse Navigator Larey Brick, Cardiac Imaging Nurse Navigator [facility] Direct Office Dial: [phone]   For scheduling needs, including cancellations and rescheduling, please call [place], [phone].    Follow-Up: At [facility], you and your health needs are our priority.  As part of our continuing mission to provide you with exceptional heart care, we have created designated Provider Care Teams.  These Care Teams include your primary Cardiologist (physician) and Advanced Practice Providers (APPs -  Physician Assistants and Nurse Practitioners) who all work together to provide you with the care you need, when you need it.  We recommend signing up for the patient portal called "MyChart".  Sign up information is provided on this After Visit Summary.  MyChart is used to connect with patients for Virtual Visits (Telemedicine).  Patients are able to view lab/test results, encounter notes, upcoming appointments, etc.  Non-urgent messages can be sent to your provider as well.   To learn more about what you can do with MyChart, go to [email or web address].    Your next appointment:   4 month(s)  The format for your next appointment:   In Person  Provider:   Lennie Odor, MD   Other Instructions  COMPRESSION HOSE    Signed, Lenna Gilford. Flora Lipps, MD, [facility]  [facility]  [address] [place], [place] [address] [phone]  11/19/2020 4:58 PM

## 2020-11-19 ENCOUNTER — Other Ambulatory Visit: Payer: Self-pay

## 2020-11-19 ENCOUNTER — Ambulatory Visit (INDEPENDENT_AMBULATORY_CARE_PROVIDER_SITE_OTHER): Payer: BC Managed Care – PPO | Admitting: Cardiovascular Disease

## 2020-11-19 ENCOUNTER — Encounter: Payer: Self-pay | Admitting: Cardiovascular Disease

## 2020-11-19 VITALS — BP 158/70 | HR 57 | Resp 20 | Ht 66.0 in | Wt 285.8 lb

## 2020-11-19 DIAGNOSIS — I872 Venous insufficiency (chronic) (peripheral): Secondary | ICD-10-CM | POA: Diagnosis not present

## 2020-11-19 DIAGNOSIS — I10 Essential (primary) hypertension: Secondary | ICD-10-CM

## 2020-11-19 DIAGNOSIS — R072 Precordial pain: Secondary | ICD-10-CM | POA: Diagnosis not present

## 2020-11-19 DIAGNOSIS — Z720 Tobacco use: Secondary | ICD-10-CM

## 2020-11-19 DIAGNOSIS — R079 Chest pain, unspecified: Secondary | ICD-10-CM

## 2020-11-19 MED ORDER — METOPROLOL TARTRATE 25 MG PO TABS: ORAL_TABLET | ORAL | 0 refills | Status: DC

## 2020-11-19 MED ORDER — LOSARTAN POTASSIUM 25 MG PO TABS: 25.0000 mg | ORAL_TABLET | Freq: Every day | ORAL | 3 refills | Status: DC

## 2020-11-19 NOTE — Patient Instructions (Signed)
Medication Instructions:   STOP AMLODIPINE  START LOSARTAN 25 MG ONCE DAILY  *If you need a refill on your cardiac medications before your next appointment, please call your pharmacy*   Lab Work:  Your physician recommends that you return for lab work PRIOR TO CT SCAN  If you have labs (blood work) drawn today and your tests are completely normal, you will receive your results only by: MyChart Message (if you have MyChart) OR A paper copy in the mail If you have any lab test that is abnormal or we need to change your treatment, we will call you to review the results.   Testing/Procedures:  Your physician has requested that you have an echocardiogram. Echocardiography is a painless test that uses sound waves to create images of your heart. It provides your doctor with information about the size and shape of your heart and how well your heart's chambers and valves are working. This procedure takes approximately one hour. There are no restrictions for this procedure. [address]     Your cardiac CT will be scheduled at   [facility] [address] [place], [place] [address] [phone]   If scheduled at [facility], please arrive at the [facility] main entrance (entrance A) of [facility] 30 minutes prior to test start time. Proceed to the [facility] Radiology Department (first floor) to check-in and test prep.   Please follow these instructions carefully (unless otherwise directed):   On the Night Before the Test: Be sure to Drink plenty of water. Do not consume any caffeinated/decaffeinated beverages or chocolate 12 hours prior to your test. Do not take any antihistamines 12 hours prior to your test.   On the Day of the Test: Drink plenty of water until 1 hour prior to the test. Do not eat any food 4 hours prior to the test. You may take your regular medications prior to the test.  Take metoprolol (Lopressor) 25 MG two  hours prior to test. HOLD Furosemide/Hydrochlorothiazide morning of the test. FEMALES- please wear underwire-free bra if available, avoid dresses & tight clothing       After the Test: Drink plenty of water. After receiving IV contrast, you may experience a mild flushed feeling. This is normal. On occasion, you may experience a mild rash up to 24 hours after the test. This is not dangerous. If this occurs, you can take Benadryl 25 mg and increase your fluid intake. If you experience trouble breathing, this can be serious. If it is severe call 911 IMMEDIATELY. If it is mild, please call our office. If you take any of these medications: Glipizide/Metformin, Avandament, Glucavance, please do not take 48 hours after completing test unless otherwise instructed.  Please allow 2-4 weeks for scheduling of routine cardiac CTs. Some insurance companies require a pre-authorization which may delay scheduling of this test.   For non-scheduling related questions, please contact the cardiac imaging nurse navigator should you have any questions/concerns: Rockwell Alexandria, Cardiac Imaging Nurse Navigator Larey Brick, Cardiac Imaging Nurse Navigator [facility] Direct Office Dial: [phone]   For scheduling needs, including cancellations and rescheduling, please call [place], [phone].    Follow-Up: At [facility], you and your health needs are our priority.  As part of our continuing mission to provide you with exceptional heart care, we have created designated Provider Care Teams.  These Care Teams include your primary Cardiologist (physician) and Advanced Practice Providers (APPs -  Physician Assistants and Nurse Practitioners) who all work together to provide you with the care you need, when you need it.  We recommend signing up for the patient portal called "MyChart".  Sign up information is provided on this After Visit Summary.  MyChart is used to connect with  patients for Virtual Visits (Telemedicine).  Patients are able to view lab/test results, encounter notes, upcoming appointments, etc.  Non-urgent messages can be sent to your provider as well.   To learn more about what you can do with MyChart, go to [email or web address].    Your next appointment:   4 month(s)  The format for your next appointment:   In Person  Provider:   Lennie Odor, MD   Other Instructions  COMPRESSION HOSE

## 2020-11-20 DIAGNOSIS — R072 Precordial pain: Secondary | ICD-10-CM | POA: Diagnosis not present

## 2020-11-20 LAB — BASIC METABOLIC PANEL
BUN/Creatinine Ratio: 23 (ref 9–23)
BUN: 20 mg/dL (ref 6–24)
CO2: 22 mmol/L (ref 20–29)
Calcium: 9.4 mg/dL (ref 8.7–10.2)
Chloride: 103 mmol/L (ref 96–106)
Creatinine, Ser: 0.87 mg/dL (ref 0.57–1.00)
Glucose: 102 mg/dL — ABNORMAL HIGH (ref 65–99)
Potassium: 4.6 mmol/L (ref 3.5–5.2)
Sodium: 138 mmol/L (ref 134–144)
eGFR: 77 mL/min/{1.73_m2} (ref >59)

## 2020-11-20 LAB — TSH: TSH: 3.49 u[IU]/mL (ref 0.450–4.500)

## 2020-11-21 ENCOUNTER — Encounter: Payer: Self-pay | Admitting: *Deleted

## 2020-11-27 ENCOUNTER — Encounter (HOSPITAL_COMMUNITY): Payer: Self-pay | Admitting: Emergency Medicine

## 2020-11-27 ENCOUNTER — Telehealth (HOSPITAL_COMMUNITY): Payer: Self-pay | Admitting: Emergency Medicine

## 2020-11-27 NOTE — Telephone Encounter (Signed)
Reaching out to patient to offer assistance regarding upcoming cardiac imaging study; pt verbalizes understanding of appt date/time, parking situation and where to check in, pre-test NPO status and medications ordered, and verified current allergies; name and call back number provided for further questions should they arise Rockwell Alexandria RN Navigator Cardiac Imaging [facility] [phone] office [phone] cell  Difficult IV 25mg  metoprolol tart Denies claustro

## 2020-11-27 NOTE — Telephone Encounter (Signed)
Attempted to call patient regarding upcoming cardiac CT appointment. °Left message on voicemail with name and callback number °Suriah Peragine RN Navigator Cardiac Imaging °[facility] °[phone] Office °[phone] Cell ° °

## 2020-11-28 ENCOUNTER — Ambulatory Visit (HOSPITAL_COMMUNITY)
Admission: RE | Admit: 2020-11-28 | Discharge: 2020-11-28 | Disposition: A | Discharge status: Home / Self Care | Payer: BC Managed Care – PPO | Source: Ambulatory Visit | Attending: Cardiovascular Disease | Admitting: Cardiovascular Disease

## 2020-11-28 ENCOUNTER — Other Ambulatory Visit: Payer: Self-pay | Admitting: Cardiology

## 2020-11-28 ENCOUNTER — Ambulatory Visit (HOSPITAL_COMMUNITY)
Admission: RE | Admit: 2020-11-28 | Discharge: 2020-11-28 | Disposition: A | Discharge status: Home / Self Care | Payer: BC Managed Care – PPO | Source: Ambulatory Visit | Attending: Cardiology | Admitting: Cardiology

## 2020-11-28 ENCOUNTER — Other Ambulatory Visit: Payer: Self-pay

## 2020-11-28 ENCOUNTER — Encounter (HOSPITAL_COMMUNITY): Payer: Self-pay

## 2020-11-28 DIAGNOSIS — R931 Abnormal findings on diagnostic imaging of heart and coronary circulation: Secondary | ICD-10-CM

## 2020-11-28 DIAGNOSIS — R072 Precordial pain: Secondary | ICD-10-CM | POA: Insufficient documentation

## 2020-11-28 DIAGNOSIS — I2510 Atherosclerotic heart disease of native coronary artery without angina pectoris: Secondary | ICD-10-CM | POA: Diagnosis not present

## 2020-11-28 DIAGNOSIS — R079 Chest pain, unspecified: Secondary | ICD-10-CM

## 2020-11-28 MED ORDER — NITROGLYCERIN 0.4 MG SL SUBL
0.8000 mg | SUBLINGUAL_TABLET | Freq: Once | SUBLINGUAL | Status: AC
  Administered 2020-11-28: 0.8 mg via SUBLINGUAL

## 2020-11-28 MED ORDER — IOHEXOL 350 MG/ML SOLN
100.0000 mL | Freq: Once | INTRAVENOUS | Status: AC | PRN
  Administered 2020-11-28: 100 mL via INTRAVENOUS

## 2020-11-28 MED ORDER — NITROGLYCERIN 0.4 MG SL SUBL
SUBLINGUAL_TABLET | SUBLINGUAL | Status: AC
  Filled 2020-11-28: qty 2

## 2020-11-29 ENCOUNTER — Telehealth: Payer: Self-pay | Admitting: Cardiovascular Disease

## 2020-11-29 DIAGNOSIS — I2510 Atherosclerotic heart disease of native coronary artery without angina pectoris: Secondary | ICD-10-CM | POA: Diagnosis not present

## 2020-11-29 NOTE — Telephone Encounter (Signed)
Called patient back, gave results.  Patient aware she needed appointment- you were only here next week, and then not in office for the next 2 1/2- 3 weeks. I placed her next week- so we can get it scheduled.   Patient verbalized understanding. I offered later appointment but patient would like to get this going now, and could only come on certain days.

## 2020-11-29 NOTE — Telephone Encounter (Signed)
Patient is returning call to discuss CT results. 

## 2020-12-01 NOTE — Progress Notes (Signed)
Cardiology Office Note:   Date:  12/03/2020  NAME:  Bethany Beard    MRN: [id number] DOB:  1960-11-18   PCP:  Jovita Kussmaul, MD  Cardiologist:  None  Electrophysiologist:  None   Referring MD: Jovita Kussmaul, MD   Chief Complaint  Patient presents with   Coronary Artery Disease    History of Present Illness:   Bethany Beard is a 60 y.o. female with a hx of tobacco abuse, hypertension, obesity who presents for follow-up.  Evaluated for atypical chest pain on 11/19/2021.  Coronary CTA with heavy calcifications.  CT FFR with concerns for obstructive mid RCA as well as mid left circumflex lesion.  Here to discuss left heart catheterization. She reports she still having episodes of chest pressure.  They occur 2-3 times per week.  She reports they can occur with exertion or at rest.  She describes someone sitting on her chest.  She reports her symptoms resolved in 20 minutes.  She reports relaxation helps.  She has quit smoking.  I congratulated her.  We discussed pursuing a left heart catheterization given that she likely has obstructive CAD in the RCA and left circumflex.  She is amendable to this.  No bleeding issues.  She does report an allergy to aspirin 50 years ago.  She has not been rechallenged.  She reports she cannot remember what the allergy was.  She is okay to be rechallenged.  Risk and benefits of left heart catheterization discussed.  She has an echocardiogram tomorrow which she will do.  Problem List HTN Tobacco abuse Asthma HLD -T chol 274, HDL 68, LDL 171, TG 192 5. Obstructive CAD -CAC score 1299 (99th precentile) -mid RCA CT FFR 0.61 -mid LCX CT FFR 0.69  Past Medical History: Past Medical History:  Diagnosis Date   Asthma    Depression    Hyperlipidemia    Migraines    Pneumonia    Thyroid disease     Past Surgical History: Past Surgical History:  Procedure Laterality Date   BACK SURGERY     carpel     CHOLECYSTECTOMY     TONSILLECTOMY      Current  Medications: Current Meds  Medication Sig   acetaminophen (TYLENOL) 500 MG tablet Take 2,000 mg by mouth every 6 (six) hours as needed for moderate pain.   aspirin EC 81 MG tablet Take 1 tablet (81 mg total) by mouth daily. Swallow whole.   atorvastatin (LIPITOR) 40 MG tablet Take 1 tablet (40 mg total) by mouth daily.   budesonide-formoterol (SYMBICORT) 160-4.5 MCG/ACT inhaler Inhale 2 puffs into the lungs 2 (two) times daily.   celecoxib (CELEBREX) 100 MG capsule Take 1 capsule (100 mg total) by mouth 2 (two) times daily.   citalopram (CELEXA) 20 MG tablet Take 1 tablet (20 mg total) by mouth daily.   fexofenadine (ALLEGRA ALLERGY) 180 MG tablet Take 1 tablet (180 mg total) by mouth daily.   levothyroxine (SYNTHROID) 100 MCG tablet Take 1 tablet (100 mcg total) by mouth every morning. 30 minutes before food   losartan (COZAAR) 25 MG tablet Take 1 tablet (25 mg total) by mouth daily.   Menthol-Methyl Salicylate (BEN GAY GREASELESS) 10-15 % greaseless cream Apply 1 application topically 3 (three) times daily as needed for pain.   metoprolol tartrate (LOPRESSOR) 25 MG tablet Take 1 tablet (25 mg total) by mouth 2 (two) times daily.   montelukast (SINGULAIR) 10 MG tablet Take 1 tablet (10 mg total) by mouth at bedtime.  nitroGLYCERIN (NITROSTAT) 0.4 MG SL tablet Place 1 tablet (0.4 mg total) under the tongue every 5 (five) minutes as needed for chest pain.     Allergies:    Aspirin, Avocado, Codeine, Tomato, and Strawberry extract   Social History: Social History   Socioeconomic History   Marital status: Widowed    Spouse name: Not on file   Number of children: 1   Years of education: Not on file   Highest education level: Not on file  Occupational History   Occupation: [employer]  Tobacco Use   Smoking status: Every Day    Packs/day: 1.00    Years: 45.00    Pack years: 45.00    Types: Cigarettes   Smokeless tobacco: Never  Vaping Use   Vaping Use: Never used  Substance and  Sexual Activity   Alcohol use: No    Comment: somtimes   Drug use: No   Sexual activity: Never  Other Topics Concern   Not on file  Social History Narrative   Not on file   Social Determinants of Health   Financial Resource Strain: Not on file  Food Insecurity: Not on file  Transportation Needs: Not on file  Physical Activity: Not on file  Stress: Not on file  Social Connections: Not on file     Family History: The patient's family history is negative for Cancer, Diabetes, Hyperlipidemia, Hypertension, Kidney disease, and Stroke. She was adopted.  ROS:   All other ROS reviewed and negative. Pertinent positives noted in the HPI.     EKGs/Labs/Other Studies Reviewed:   The following studies were personally reviewed by me today:   CT FFR 11/29/2020 1. Left Main: No significant stenosis. 2. LAD: Ostium 0.91, mid 0.85, distal 0.79. 3. LCX: Proximal 0.85, mid 0.69. 4. RCA: Proximal 0.94 to 0.87, mid 0.63 to 0.54.   IMPRESSION: 1. CT FFR analysis demonstrates 3 vessel stenosis. Recommend cardiac catheterization.  CCTA 11/28/2020 1. Coronary calcium score of 1299. This was 72 percentile for age and sex matched control.   2. Normal coronary origin with right dominance.   3. Diffuse CAD. Sending for FFR if able to process. Suboptimal study. Recommend cardiac catheterization.    Recent Labs: 06/24/2020: ALT 13; Hemoglobin 12.1; Platelets 252 11/20/2020: BUN 20; Creatinine, Ser 0.87; Potassium 4.6; Sodium 138; TSH 3.490   Recent Lipid Panel    Component Value Date/Time   CHOL 274 (H) 06/24/2020 1059   TRIG 192 (H) 06/24/2020 1059   HDL 68 06/24/2020 1059   CHOLHDL 4.0 06/24/2020 1059   CHOLHDL 4 01/04/2012 1530   VLDL 24.8 01/04/2012 1530   LDLCALC 171 (H) 06/24/2020 1059   LDLDIRECT 207.2 01/04/2012 1530    Physical Exam:   VS:  BP (!) 150/80   Pulse 85   Ht 5\' 6"  (1.676 m)   Wt 289 lb 14.4 oz (131.5 kg)   SpO2 94%   BMI 46.79 kg/m    Wt Readings from Last 3  Encounters:  12/03/20 289 lb 14.4 oz (131.5 kg)  11/19/20 285 lb 12.8 oz (129.6 kg)  11/05/20 286 lb (129.7 kg)    General: Well nourished, well developed, in no acute distress Head: Atraumatic, normal size  Eyes: PEERLA, EOMI  Neck: Supple, no JVD Endocrine: No thryomegaly Cardiac: Normal S1, S2; RRR; no murmurs, rubs, or gallops Lungs: Clear to auscultation bilaterally, no wheezing, rhonchi or rales  Abd: Soft, nontender, no hepatomegaly  Ext: No edema, pulses 2+ Musculoskeletal: No deformities, BUE and BLE  strength normal and equal Skin: Warm and dry, no rashes   Neuro: Alert and oriented to person, place, time, and situation, CNII-XII grossly intact, no focal deficits  Psych: Normal mood and affect   ASSESSMENT:   Jara Feider is a 60 y.o. female who presents for the following: 1. Coronary artery disease involving native coronary artery of native heart with unstable angina pectoris (HCC)   2. Agatston coronary artery calcium score greater than 400   3. Mixed hyperlipidemia   4. Primary hypertension     PLAN:   1. Coronary artery disease involving native coronary artery of native heart with unstable angina pectoris (HCC) 2. Agatston coronary artery calcium score greater than 400 3. Mixed hyperlipidemia -Coronary CTA with very heavily calcified coronary arteries.  Mid RCA positive by CT FFR, 0.61.  Mid left circumflex positive by CT FFR 0.69.  LAD has distal tapering artifact.  I suspect there is no significant stenosis in this artery. -She is still having chest pain symptoms.  They can occur at rest.  They can occur with exertion.  They resolved with cessation of activity or relaxation.  I fear she may have unstable angina.  We will start aspirin and Lipitor 40 mg daily.  She apparently was allergic to aspirin in the past.  She cannot member the allergy.  We will rechallenge her.  She will let us know this week how it goes.  We will set her up for left heart catheterization next  week.  We will start her on metoprolol tartrate 25 mg twice daily.  We will give her a prescription for sublingual nitroglycerin.  I did counsel her extensively about how to take this medication.  If she has prolonged episodes of chest pressure or pain that does not resolve within 15 minutes, 3 to 5 minutes per 3 doses of nitroglycerin, she should present to the emergency room.  She is aware of this.  No chest pain symptoms in office today.  Echocardiogram will be performed tomorrow. -She quit smoking.  Her LDL cholesterol is not at goal.  We will recheck this in the next few months.  We will proceed with left heart catheterization as detailed above.  She is willing to proceed.  This may end up being something we can treat medically.  For now given that she has had symptoms at rest I favor invasive angiography to define her anatomy.  4. Primary hypertension -BP elevated today.  Continue losartan 25 mg daily.  Metoprolol tartrate 25 mg daily added as above.  Shared Decision Making/Informed Consent The risks [stroke (1 in 1000), death (1 in 1000), kidney failure [usually temporary] (1 in 500), bleeding (1 in 200), allergic reaction [possibly serious] (1 in 200)], benefits (diagnostic support and management of coronary artery disease) and alternatives of a cardiac catheterization were discussed in detail with Bethany Beard and she is willing to proceed.  Disposition: Return in about 1 month (around 01/03/2021).  Medication Adjustments/Labs and Tests Ordered: Current medicines are reviewed at length with the patient today.  Concerns regarding medicines are outlined above.  Orders Placed This Encounter  Procedures   Basic metabolic panel   CBC   TSH    Meds ordered this encounter  Medications   aspirin EC 81 MG tablet    Sig: Take 1 tablet (81 mg total) by mouth daily. Swallow whole.    Dispense:  90 tablet    Refill:  3   atorvastatin (LIPITOR) 40 MG tablet    Sig:  Take 1 tablet (40 mg total) by  mouth daily.    Dispense:  90 tablet    Refill:  3   metoprolol tartrate (LOPRESSOR) 25 MG tablet    Sig: Take 1 tablet (25 mg total) by mouth 2 (two) times daily.    Dispense:  180 tablet    Refill:  3   nitroGLYCERIN (NITROSTAT) 0.4 MG SL tablet    Sig: Place 1 tablet (0.4 mg total) under the tongue every 5 (five) minutes as needed for chest pain.    Dispense:  25 tablet    Refill:  2     Patient Instructions  Medication Instructions:  Start Aspirin (start this week, let us know if you tolerate this) Start Lipitor 40 mg daily  Start Metoprolol Tartrate 25 mg twice daily  Take Nitroglycerin as instructed by MD.  *If you need a refill on your cardiac medications before your next appointment, please call your pharmacy*   Lab Work: CBC, BMET, TSH today   If you have labs (blood work) drawn today and your tests are completely normal, you will receive your results only by: MyChart Message (if you have MyChart) OR A paper copy in the mail If you have any lab test that is abnormal or we need to change your treatment, we will call you to review the results.   Testing/Procedures: Your physician has requested that you have a cardiac catheterization. Cardiac catheterization is used to diagnose and/or treat various heart conditions. Doctors may recommend this procedure for a number of different reasons. The most common reason is to evaluate chest pain. Chest pain can be a symptom of coronary artery disease (CAD), and cardiac catheterization can show whether plaque is narrowing or blocking your heart's arteries. This procedure is also used to evaluate the valves, as well as measure the blood flow and oxygen levels in different parts of your heart. For further information please visit [email or web address]. Please follow instruction sheet, as given.    Follow-Up: At [facility], you and your health needs are our priority.  As part of our continuing mission to provide you with exceptional  heart care, we have created designated Provider Care Teams.  These Care Teams include your primary Cardiologist (physician) and Advanced Practice Providers (APPs -  Physician Assistants and Nurse Practitioners) who all work together to provide you with the care you need, when you need it.  We recommend signing up for the patient portal called "MyChart".  Sign up information is provided on this After Visit Summary.  MyChart is used to connect with patients for Virtual Visits (Telemedicine).  Patients are able to view lab/test results, encounter notes, upcoming appointments, etc.  Non-urgent messages can be sent to your provider as well.   To learn more about what you can do with MyChart, go to [email or web address].    Your next appointment:   September 6th at 9:00 AM.   The format for your next appointment:   In Person  Provider:   Lennie OdorWesley O'Neal, MD   Other Instructions  [facility] [address] [place] [place] [address] Dept: [phone] Loc: [phone]  Quinn PlowmanKandee Hund  12/03/2020  You are scheduled for a Cardiac Catheterization on Wednesday, August 24 with Dr. Verne Carrowhristopher McAlhany.  1. Please [facility] at [facility]: [address] [place], [place] [address] at 9:30 AM (This time is two hours before your procedure to ensure your  preparation). Free valet parking service is available.   Special note: Every effort is made to have your procedure done on time. Please understand that emergencies sometimes delay scheduled procedures.  2. Diet: Do not eat solid foods after midnight.  The patient may have clear liquids until 5am upon the day of the procedure.  3. Labs: You will need to have blood drawn today. BMET, CBC today. You do not need to be fasting.  4. Medication instructions in preparation for your procedure:   Contrast Allergy: No  On the morning  of your procedure, take your Aspirin and any morning medicines NOT listed above.  You may use sips of water.  5. Plan for one night stay--bring personal belongings. 6. Bring a current list of your medications and current insurance cards. 7. You MUST have a responsible person to drive you home. 8. Someone MUST be with you the first 24 hours after you arrive home or your discharge will be delayed. 9. Please wear clothes that are easy to get on and off and wear slip-on shoes.  Thank you for allowing [place] to care for you!   -- [facility]    Time Spent with Patient: I have spent a total of 35 minutes with patient reviewing hospital notes, telemetry, EKGs, labs and examining the patient as well as establishing an assessment and plan that was discussed with the patient.  > 50% of time was spent in direct patient care.  Signed, Lenna Gilford. Flora Lipps, MD, [facility]  [facility]  [address] [place], [place] [address] [phone]  12/03/2020 9:39 AM

## 2020-12-01 NOTE — H&P (View-Only) (Signed)
Cardiology Office Note:   Date:  12/03/2020  NAME:  Bethany Beard    MRN: [id number] DOB:  09/01/1960   PCP:  Maness, Philip, MD  Cardiologist:  None  Electrophysiologist:  None   Referring MD: Maness, Philip, MD   Chief Complaint  Patient presents with   Coronary Artery Disease    History of Present Illness:   Bethany Beard is a 60 y.o. female with a hx of tobacco abuse, hypertension, obesity who presents for follow-up.  Evaluated for atypical chest pain on 11/19/2021.  Coronary CTA with heavy calcifications.  CT FFR with concerns for obstructive mid RCA as well as mid left circumflex lesion.  Here to discuss left heart catheterization. She reports she still having episodes of chest pressure.  They occur 2-3 times per week.  She reports they can occur with exertion or at rest.  She describes someone sitting on her chest.  She reports her symptoms resolved in 20 minutes.  She reports relaxation helps.  She has quit smoking.  I congratulated her.  We discussed pursuing a left heart catheterization given that she likely has obstructive CAD in the RCA and left circumflex.  She is amendable to this.  No bleeding issues.  She does report an allergy to aspirin 50 years ago.  She has not been rechallenged.  She reports she cannot remember what the allergy was.  She is okay to be rechallenged.  Risk and benefits of left heart catheterization discussed.  She has an echocardiogram tomorrow which she will do.  Problem List HTN Tobacco abuse Asthma HLD -T chol 274, HDL 68, LDL 171, TG 192 5. Obstructive CAD -CAC score 1299 (99th precentile) -mid RCA CT FFR 0.61 -mid LCX CT FFR 0.69  Past Medical History: Past Medical History:  Diagnosis Date   Asthma    Depression    Hyperlipidemia    Migraines    Pneumonia    Thyroid disease     Past Surgical History: Past Surgical History:  Procedure Laterality Date   BACK SURGERY     carpel     CHOLECYSTECTOMY     TONSILLECTOMY      Current  Medications: Current Meds  Medication Sig   acetaminophen (TYLENOL) 500 MG tablet Take 2,000 mg by mouth every 6 (six) hours as needed for moderate pain.   aspirin EC 81 MG tablet Take 1 tablet (81 mg total) by mouth daily. Swallow whole.   atorvastatin (LIPITOR) 40 MG tablet Take 1 tablet (40 mg total) by mouth daily.   budesonide-formoterol (SYMBICORT) 160-4.5 MCG/ACT inhaler Inhale 2 puffs into the lungs 2 (two) times daily.   celecoxib (CELEBREX) 100 MG capsule Take 1 capsule (100 mg total) by mouth 2 (two) times daily.   citalopram (CELEXA) 20 MG tablet Take 1 tablet (20 mg total) by mouth daily.   fexofenadine (ALLEGRA ALLERGY) 180 MG tablet Take 1 tablet (180 mg total) by mouth daily.   levothyroxine (SYNTHROID) 100 MCG tablet Take 1 tablet (100 mcg total) by mouth every morning. 30 minutes before food   losartan (COZAAR) 25 MG tablet Take 1 tablet (25 mg total) by mouth daily.   Menthol-Methyl Salicylate (BEN GAY GREASELESS) 10-15 % greaseless cream Apply 1 application topically 3 (three) times daily as needed for pain.   metoprolol tartrate (LOPRESSOR) 25 MG tablet Take 1 tablet (25 mg total) by mouth 2 (two) times daily.   montelukast (SINGULAIR) 10 MG tablet Take 1 tablet (10 mg total) by mouth at bedtime.     nitroGLYCERIN (NITROSTAT) 0.4 MG SL tablet Place 1 tablet (0.4 mg total) under the tongue every 5 (five) minutes as needed for chest pain.     Allergies:    Aspirin, Avocado, Codeine, Tomato, and Strawberry extract   Social History: Social History   Socioeconomic History   Marital status: Widowed    Spouse name: Not on file   Number of children: 1   Years of education: Not on file   Highest education level: Not on file  Occupational History   Occupation: [employer]  Tobacco Use   Smoking status: Every Day    Packs/day: 1.00    Years: 45.00    Pack years: 45.00    Types: Cigarettes   Smokeless tobacco: Never  Vaping Use   Vaping Use: Never used  Substance and  Sexual Activity   Alcohol use: No    Comment: somtimes   Drug use: No   Sexual activity: Never  Other Topics Concern   Not on file  Social History Narrative   Not on file   Social Determinants of Health   Financial Resource Strain: Not on file  Food Insecurity: Not on file  Transportation Needs: Not on file  Physical Activity: Not on file  Stress: Not on file  Social Connections: Not on file     Family History: The patient's family history is negative for Cancer, Diabetes, Hyperlipidemia, Hypertension, Kidney disease, and Stroke. She was adopted.  ROS:   All other ROS reviewed and negative. Pertinent positives noted in the HPI.     EKGs/Labs/Other Studies Reviewed:   The following studies were personally reviewed by me today:   CT FFR 11/29/2020 1. Left Main: No significant stenosis. 2. LAD: Ostium 0.91, mid 0.85, distal 0.79. 3. LCX: Proximal 0.85, mid 0.69. 4. RCA: Proximal 0.94 to 0.87, mid 0.63 to 0.54.   IMPRESSION: 1. CT FFR analysis demonstrates 3 vessel stenosis. Recommend cardiac catheterization.  CCTA 11/28/2020 1. Coronary calcium score of 1299. This was 72 percentile for age and sex matched control.   2. Normal coronary origin with right dominance.   3. Diffuse CAD. Sending for FFR if able to process. Suboptimal study. Recommend cardiac catheterization.    Recent Labs: 06/24/2020: ALT 13; Hemoglobin 12.1; Platelets 252 11/20/2020: BUN 20; Creatinine, Ser 0.87; Potassium 4.6; Sodium 138; TSH 3.490   Recent Lipid Panel    Component Value Date/Time   CHOL 274 (H) 06/24/2020 1059   TRIG 192 (H) 06/24/2020 1059   HDL 68 06/24/2020 1059   CHOLHDL 4.0 06/24/2020 1059   CHOLHDL 4 01/04/2012 1530   VLDL 24.8 01/04/2012 1530   LDLCALC 171 (H) 06/24/2020 1059   LDLDIRECT 207.2 01/04/2012 1530    Physical Exam:   VS:  BP (!) 150/80   Pulse 85   Ht 5\' 6"  (1.676 m)   Wt 289 lb 14.4 oz (131.5 kg)   SpO2 94%   BMI 46.79 kg/m    Wt Readings from Last 3  Encounters:  12/03/20 289 lb 14.4 oz (131.5 kg)  11/19/20 285 lb 12.8 oz (129.6 kg)  11/05/20 286 lb (129.7 kg)    General: Well nourished, well developed, in no acute distress Head: Atraumatic, normal size  Eyes: PEERLA, EOMI  Neck: Supple, no JVD Endocrine: No thryomegaly Cardiac: Normal S1, S2; RRR; no murmurs, rubs, or gallops Lungs: Clear to auscultation bilaterally, no wheezing, rhonchi or rales  Abd: Soft, nontender, no hepatomegaly  Ext: No edema, pulses 2+ Musculoskeletal: No deformities, BUE and BLE  strength normal and equal Skin: Warm and dry, no rashes   Neuro: Alert and oriented to person, place, time, and situation, CNII-XII grossly intact, no focal deficits  Psych: Normal mood and affect   ASSESSMENT:   Jara Feider is a 60 y.o. female who presents for the following: 1. Coronary artery disease involving native coronary artery of native heart with unstable angina pectoris (HCC)   2. Agatston coronary artery calcium score greater than 400   3. Mixed hyperlipidemia   4. Primary hypertension     PLAN:   1. Coronary artery disease involving native coronary artery of native heart with unstable angina pectoris (HCC) 2. Agatston coronary artery calcium score greater than 400 3. Mixed hyperlipidemia -Coronary CTA with very heavily calcified coronary arteries.  Mid RCA positive by CT FFR, 0.61.  Mid left circumflex positive by CT FFR 0.69.  LAD has distal tapering artifact.  I suspect there is no significant stenosis in this artery. -She is still having chest pain symptoms.  They can occur at rest.  They can occur with exertion.  They resolved with cessation of activity or relaxation.  I fear she may have unstable angina.  We will start aspirin and Lipitor 40 mg daily.  She apparently was allergic to aspirin in the past.  She cannot member the allergy.  We will rechallenge her.  She will let us know this week how it goes.  We will set her up for left heart catheterization next  week.  We will start her on metoprolol tartrate 25 mg twice daily.  We will give her a prescription for sublingual nitroglycerin.  I did counsel her extensively about how to take this medication.  If she has prolonged episodes of chest pressure or pain that does not resolve within 15 minutes, 3 to 5 minutes per 3 doses of nitroglycerin, she should present to the emergency room.  She is aware of this.  No chest pain symptoms in office today.  Echocardiogram will be performed tomorrow. -She quit smoking.  Her LDL cholesterol is not at goal.  We will recheck this in the next few months.  We will proceed with left heart catheterization as detailed above.  She is willing to proceed.  This may end up being something we can treat medically.  For now given that she has had symptoms at rest I favor invasive angiography to define her anatomy.  4. Primary hypertension -BP elevated today.  Continue losartan 25 mg daily.  Metoprolol tartrate 25 mg daily added as above.  Shared Decision Making/Informed Consent The risks [stroke (1 in 1000), death (1 in 1000), kidney failure [usually temporary] (1 in 500), bleeding (1 in 200), allergic reaction [possibly serious] (1 in 200)], benefits (diagnostic support and management of coronary artery disease) and alternatives of a cardiac catheterization were discussed in detail with Ms. Oddo and she is willing to proceed.  Disposition: Return in about 1 month (around 01/03/2021).  Medication Adjustments/Labs and Tests Ordered: Current medicines are reviewed at length with the patient today.  Concerns regarding medicines are outlined above.  Orders Placed This Encounter  Procedures   Basic metabolic panel   CBC   TSH    Meds ordered this encounter  Medications   aspirin EC 81 MG tablet    Sig: Take 1 tablet (81 mg total) by mouth daily. Swallow whole.    Dispense:  90 tablet    Refill:  3   atorvastatin (LIPITOR) 40 MG tablet    Sig:  Take 1 tablet (40 mg total) by  mouth daily.    Dispense:  90 tablet    Refill:  3   metoprolol tartrate (LOPRESSOR) 25 MG tablet    Sig: Take 1 tablet (25 mg total) by mouth 2 (two) times daily.    Dispense:  180 tablet    Refill:  3   nitroGLYCERIN (NITROSTAT) 0.4 MG SL tablet    Sig: Place 1 tablet (0.4 mg total) under the tongue every 5 (five) minutes as needed for chest pain.    Dispense:  25 tablet    Refill:  2     Patient Instructions  Medication Instructions:  Start Aspirin (start this week, let us know if you tolerate this) Start Lipitor 40 mg daily  Start Metoprolol Tartrate 25 mg twice daily  Take Nitroglycerin as instructed by MD.  *If you need a refill on your cardiac medications before your next appointment, please call your pharmacy*   Lab Work: CBC, BMET, TSH today   If you have labs (blood work) drawn today and your tests are completely normal, you will receive your results only by: MyChart Message (if you have MyChart) OR A paper copy in the mail If you have any lab test that is abnormal or we need to change your treatment, we will call you to review the results.   Testing/Procedures: Your physician has requested that you have a cardiac catheterization. Cardiac catheterization is used to diagnose and/or treat various heart conditions. Doctors may recommend this procedure for a number of different reasons. The most common reason is to evaluate chest pain. Chest pain can be a symptom of coronary artery disease (CAD), and cardiac catheterization can show whether plaque is narrowing or blocking your heart's arteries. This procedure is also used to evaluate the valves, as well as measure the blood flow and oxygen levels in different parts of your heart. For further information please visit [email or web address]. Please follow instruction sheet, as given.    Follow-Up: At [facility], you and your health needs are our priority.  As part of our continuing mission to provide you with exceptional  heart care, we have created designated Provider Care Teams.  These Care Teams include your primary Cardiologist (physician) and Advanced Practice Providers (APPs -  Physician Assistants and Nurse Practitioners) who all work together to provide you with the care you need, when you need it.  We recommend signing up for the patient portal called "MyChart".  Sign up information is provided on this After Visit Summary.  MyChart is used to connect with patients for Virtual Visits (Telemedicine).  Patients are able to view lab/test results, encounter notes, upcoming appointments, etc.  Non-urgent messages can be sent to your provider as well.   To learn more about what you can do with MyChart, go to [email or web address].    Your next appointment:   September 6th at 9:00 AM.   The format for your next appointment:   In Person  Provider:   Lennie OdorWesley O'Neal, MD   Other Instructions  [facility] [address] [place] [place] [id number] Dept: [phone] Loc: [phone]  Quinn PlowmanKandee Hund  12/03/2020  You are scheduled for a Cardiac Catheterization on Wednesday, August 24 with Dr. Verne Carrowhristopher McAlhany.  1. Please arrive [facility] the [facility] at [facility]: [address] [place], [place] [address] at 9:30 AM (This time is two hours before your procedure to ensure your  preparation). Free valet parking service is available.   Special note: Every effort is made to have your procedure done on time. Please understand that emergencies sometimes delay scheduled procedures.  2. Diet: Do not eat solid foods after midnight.  The patient may have clear liquids until 5am upon the day of the procedure.  3. Labs: You will need to have blood drawn today. BMET, CBC today. You do not need to be fasting.  4. Medication instructions in preparation for your procedure:   Contrast Allergy: No  On the morning  of your procedure, take your Aspirin and any morning medicines NOT listed above.  You may use sips of water.  5. Plan for one night stay--bring personal belongings. 6. Bring a current list of your medications and current insurance cards. 7. You MUST have a responsible person to drive you home. 8. Someone MUST be with you the first 24 hours after you arrive home or your discharge will be delayed. 9. Please wear clothes that are easy to get on and off and wear slip-on shoes.  Thank you for allowing [place] to care for you!   -- [facility]    Time Spent with Patient: I have spent a total of 35 minutes with patient reviewing hospital notes, telemetry, EKGs, labs and examining the patient as well as establishing an assessment and plan that was discussed with the patient.  > 50% of time was spent in direct patient care.  Signed, Lenna Gilford. Flora Lipps, MD, [facility]  [facility]  [address] [place], [place] [address] [phone]  12/03/2020 9:39 AM

## 2020-12-03 ENCOUNTER — Other Ambulatory Visit: Payer: Self-pay

## 2020-12-03 ENCOUNTER — Encounter: Payer: Self-pay | Admitting: Cardiovascular Disease

## 2020-12-03 ENCOUNTER — Ambulatory Visit (INDEPENDENT_AMBULATORY_CARE_PROVIDER_SITE_OTHER): Payer: BC Managed Care – PPO | Admitting: Cardiovascular Disease

## 2020-12-03 VITALS — BP 150/80 | HR 85 | Ht 66.0 in | Wt 289.9 lb

## 2020-12-03 DIAGNOSIS — I25110 Atherosclerotic heart disease of native coronary artery with unstable angina pectoris: Secondary | ICD-10-CM | POA: Diagnosis not present

## 2020-12-03 DIAGNOSIS — I10 Essential (primary) hypertension: Secondary | ICD-10-CM

## 2020-12-03 DIAGNOSIS — E782 Mixed hyperlipidemia: Secondary | ICD-10-CM

## 2020-12-03 DIAGNOSIS — I2510 Atherosclerotic heart disease of native coronary artery without angina pectoris: Secondary | ICD-10-CM

## 2020-12-03 DIAGNOSIS — R931 Abnormal findings on diagnostic imaging of heart and coronary circulation: Secondary | ICD-10-CM | POA: Diagnosis not present

## 2020-12-03 LAB — CBC
Hematocrit: 32.3 % — ABNORMAL LOW (ref 34.0–46.6)
Hemoglobin: 10.2 g/dL — ABNORMAL LOW (ref 11.1–15.9)
MCH: 29.6 pg (ref 26.6–33.0)
MCHC: 31.6 g/dL (ref 31.5–35.7)
MCV: 94 fL (ref 79–97)
Platelets: 244 10*3/uL (ref 150–450)
RBC: 3.45 x10E6/uL — ABNORMAL LOW (ref 3.77–5.28)
RDW: 13.8 % (ref 11.7–15.4)
WBC: 5 10*3/uL (ref 3.4–10.8)

## 2020-12-03 LAB — BASIC METABOLIC PANEL
BUN/Creatinine Ratio: 20 (ref 9–23)
BUN: 19 mg/dL (ref 6–24)
CO2: 24 mmol/L (ref 20–29)
Calcium: 9.5 mg/dL (ref 8.7–10.2)
Chloride: 103 mmol/L (ref 96–106)
Creatinine, Ser: 0.94 mg/dL (ref 0.57–1.00)
Glucose: 96 mg/dL (ref 65–99)
Potassium: 5 mmol/L (ref 3.5–5.2)
Sodium: 142 mmol/L (ref 134–144)
eGFR: 70 mL/min/{1.73_m2} (ref >59)

## 2020-12-03 LAB — TSH: TSH: 5.05 u[IU]/mL — ABNORMAL HIGH (ref 0.450–4.500)

## 2020-12-03 MED ORDER — ATORVASTATIN CALCIUM 40 MG PO TABS: 40.0000 mg | ORAL_TABLET | Freq: Every day | ORAL | 3 refills | Status: DC

## 2020-12-03 MED ORDER — ASPIRIN EC 81 MG PO TBEC: 81.0000 mg | DELAYED_RELEASE_TABLET | Freq: Every day | ORAL | 3 refills | Status: DC

## 2020-12-03 MED ORDER — NITROGLYCERIN 0.4 MG SL SUBL: 0.4000 mg | SUBLINGUAL_TABLET | SUBLINGUAL | 2 refills | Status: DC | PRN

## 2020-12-03 MED ORDER — METOPROLOL TARTRATE 25 MG PO TABS: 25.0000 mg | ORAL_TABLET | Freq: Two times a day (BID) | ORAL | 3 refills | Status: DC

## 2020-12-03 NOTE — Patient Instructions (Signed)
Medication Instructions:  Start Aspirin (start this week, let us know if you tolerate this) Start Lipitor 40 mg daily  Start Metoprolol Tartrate 25 mg twice daily  Take Nitroglycerin as instructed by MD.  *If you need a refill on your cardiac medications before your next appointment, please call your pharmacy*   Lab Work: CBC, BMET, TSH today   If you have labs (blood work) drawn today and your tests are completely normal, you will receive your results only by: MyChart Message (if you have MyChart) OR A paper copy in the mail If you have any lab test that is abnormal or we need to change your treatment, we will call you to review the results.   Testing/Procedures: Your physician has requested that you have a cardiac catheterization. Cardiac catheterization is used to diagnose and/or treat various heart conditions. Doctors may recommend this procedure for a number of different reasons. The most common reason is to evaluate chest pain. Chest pain can be a symptom of coronary artery disease (CAD), and cardiac catheterization can show whether plaque is narrowing or blocking your heart's arteries. This procedure is also used to evaluate the valves, as well as measure the blood flow and oxygen levels in different parts of your heart. For further information please visit [email or web address] Please follow instruction sheet, as given.    Follow-Up: At [facility], you and your health needs are our priority.  As part of our continuing mission to provide you with exceptional heart care, we have created designated Provider Care Teams.  These Care Teams include your primary Cardiologist (physician) and Advanced Practice Providers (APPs -  Physician Assistants and Nurse Practitioners) who all work together to provide you with the care you need, when you need it.  We recommend signing up for the patient portal called "MyChart".  Sign up information is provided on this After Visit Summary.  MyChart is  used to connect with patients for Virtual Visits (Telemedicine).  Patients are able to view lab/test results, encounter notes, upcoming appointments, etc.  Non-urgent messages can be sent to your provider as well.   To learn more about what you can do with MyChart, go to [email or web address].    Your next appointment:   September 6th at 9:00 AM.   The format for your next appointment:   In Person  Provider:   Lennie Odor, MD   Other Instructions  [facility] CARDIOVASCULAR DIVISION [facility] [address] [place] [address] Dept: [phone] Loc: [phone]  Bethany Beard  12/03/2020  You are scheduled for a Cardiac Catheterization on Wednesday, August 24 with Dr. Verne Carrow.  1. Please arrive at the [facility] (Main Entrance A) at [facility]: [address] [place], [place] [address] at 9:30 AM (This time is two hours before your procedure to ensure your preparation). Free valet parking service is available.   Special note: Every effort is made to have your procedure done on time. Please understand that emergencies sometimes delay scheduled procedures.  2. Diet: Do not eat solid foods after midnight.  The patient may have clear liquids until 5am upon the day of the procedure.  3. Labs: You will need to have blood drawn today. BMET, CBC today. You do not need to be fasting.  4. Medication instructions in preparation for your procedure:   Contrast Allergy: No  On the morning of your procedure, take your Aspirin and any morning medicines NOT listed above.  You may  use sips of water.  5. Plan for one night stay--bring personal belongings. 6. Bring a current list of your medications and current insurance cards. 7. You MUST have a responsible person to drive you home. 8. Someone MUST be with you the first 24 hours after you arrive home or your discharge will be delayed. 9. Please wear clothes that  are easy to get on and off and wear slip-on shoes.  Thank you for allowing [place] to care for you!   -- [place] [facility]

## 2020-12-04 ENCOUNTER — Ambulatory Visit (HOSPITAL_COMMUNITY): Payer: BC Managed Care – PPO | Attending: Cardiology

## 2020-12-04 DIAGNOSIS — R072 Precordial pain: Secondary | ICD-10-CM | POA: Insufficient documentation

## 2020-12-04 LAB — ECHOCARDIOGRAM COMPLETE
Area-P 1/2: 4.63 cm2
MV VTI: 2.14 cm2
P 1/2 time: 487 msec
S' Lateral: 3.5 cm

## 2020-12-04 MED ORDER — PERFLUTREN LIPID MICROSPHERE
1.0000 mL | INTRAVENOUS | Status: AC | PRN
  Administered 2020-12-04: 1 mL via INTRAVENOUS

## 2020-12-05 ENCOUNTER — Other Ambulatory Visit: Payer: Self-pay | Admitting: Family Medicine

## 2020-12-10 ENCOUNTER — Telehealth: Payer: Self-pay | Admitting: *Deleted

## 2020-12-10 NOTE — Telephone Encounter (Signed)
Cardiac catheterization scheduled at [facility] for: Wednesday December 11, 2020 11:30 AM [facility] Main Entrance A [facility]) at: 9:30 AM   No solid food after midnight prior to cath, clear liquids until 5 AM day of procedure.   Morning medications can be taken pre-cath with sips of water including aspirin 81 mg. Pt reports she has been taking aspirin for a few days, no rash/itching/redness/angioedema. Pt does report some occasional shortness of breath but does not feel related to taking aspirin.     Confirmed patient has responsible adult to drive home post procedure and be with patient first 24 hours after arriving home.  Patients are allowed one visitor in the waiting room during the time they are at the hospital for their procedure. Both patient and visitor must wear a mask once they enter the hospital.   Patient reports does not currently have any symptoms concerning for COVID-19 and no household members with COVID-19 like illness.      Reviewed procedure/mask/visitor instructions with patient.

## 2020-12-11 ENCOUNTER — Encounter (HOSPITAL_COMMUNITY): Payer: Self-pay | Admitting: Cardiovascular Disease

## 2020-12-11 ENCOUNTER — Encounter: Payer: Self-pay | Admitting: *Deleted

## 2020-12-11 ENCOUNTER — Observation Stay (HOSPITAL_COMMUNITY)
Admission: RE | Admit: 2020-12-11 | Discharge: 2020-12-12 | Disposition: A | Discharge status: Home / Self Care | Payer: BC Managed Care – PPO | Attending: Cardiovascular Disease | Admitting: Cardiovascular Disease

## 2020-12-11 ENCOUNTER — Other Ambulatory Visit: Payer: Self-pay

## 2020-12-11 ENCOUNTER — Inpatient Hospital Stay (HOSPITAL_COMMUNITY)
Admission: RE | Disposition: A | Discharge status: Home / Self Care | Payer: Self-pay | Source: Home / Self Care | Attending: Cardiovascular Disease

## 2020-12-11 DIAGNOSIS — I10 Essential (primary) hypertension: Secondary | ICD-10-CM | POA: Insufficient documentation

## 2020-12-11 DIAGNOSIS — I25110 Atherosclerotic heart disease of native coronary artery with unstable angina pectoris: Principal | ICD-10-CM | POA: Insufficient documentation

## 2020-12-11 DIAGNOSIS — R0789 Other chest pain: Secondary | ICD-10-CM | POA: Diagnosis not present

## 2020-12-11 DIAGNOSIS — I2582 Chronic total occlusion of coronary artery: Secondary | ICD-10-CM | POA: Insufficient documentation

## 2020-12-11 DIAGNOSIS — E7800 Pure hypercholesterolemia, unspecified: Secondary | ICD-10-CM | POA: Diagnosis present

## 2020-12-11 DIAGNOSIS — Z79899 Other long term (current) drug therapy: Secondary | ICD-10-CM | POA: Insufficient documentation

## 2020-12-11 DIAGNOSIS — K219 Gastro-esophageal reflux disease without esophagitis: Secondary | ICD-10-CM | POA: Diagnosis present

## 2020-12-11 DIAGNOSIS — J45909 Unspecified asthma, uncomplicated: Secondary | ICD-10-CM | POA: Insufficient documentation

## 2020-12-11 DIAGNOSIS — Z006 Encounter for examination for normal comparison and control in clinical research program: Secondary | ICD-10-CM

## 2020-12-11 DIAGNOSIS — I200 Unstable angina: Secondary | ICD-10-CM

## 2020-12-11 DIAGNOSIS — F17210 Nicotine dependence, cigarettes, uncomplicated: Secondary | ICD-10-CM | POA: Diagnosis not present

## 2020-12-11 DIAGNOSIS — Z7982 Long term (current) use of aspirin: Secondary | ICD-10-CM | POA: Diagnosis not present

## 2020-12-11 HISTORY — PX: LEFT HEART CATH AND CORONARY ANGIOGRAPHY: CATH118249

## 2020-12-11 SURGERY — LEFT HEART CATH AND CORONARY ANGIOGRAPHY
Anesthesia: LOCAL

## 2020-12-11 MED ORDER — CELECOXIB 100 MG PO CAPS
100.0000 mg | ORAL_CAPSULE | Freq: Two times a day (BID) | ORAL | Status: DC
  Administered 2020-12-11 – 2020-12-12 (×2): 100 mg via ORAL
  Filled 2020-12-11 (×3): qty 1

## 2020-12-11 MED ORDER — HEPARIN SODIUM (PORCINE) 1000 UNIT/ML IJ SOLN
INTRAMUSCULAR | Status: AC
  Filled 2020-12-11: qty 1

## 2020-12-11 MED ORDER — CLOPIDOGREL BISULFATE 75 MG PO TABS
300.0000 mg | ORAL_TABLET | Freq: Once | ORAL | Status: AC
  Administered 2020-12-11: 300 mg via ORAL
  Filled 2020-12-11: qty 4

## 2020-12-11 MED ORDER — HEPARIN SODIUM (PORCINE) 1000 UNIT/ML IJ SOLN
INTRAMUSCULAR | Status: DC | PRN
  Administered 2020-12-11: 6000 [IU] via INTRAVENOUS

## 2020-12-11 MED ORDER — ACETAMINOPHEN 325 MG PO TABS
650.0000 mg | ORAL_TABLET | ORAL | Status: DC | PRN
  Administered 2020-12-12: 650 mg via ORAL
  Filled 2020-12-11: qty 2

## 2020-12-11 MED ORDER — HEPARIN (PORCINE) IN NACL 1000-0.9 UT/500ML-% IV SOLN
INTRAVENOUS | Status: DC | PRN
  Administered 2020-12-11 (×2): 500 mL

## 2020-12-11 MED ORDER — ONDANSETRON HCL 4 MG/2ML IJ SOLN: 4.0000 mg | Freq: Four times a day (QID) | INTRAMUSCULAR | Status: DC | PRN

## 2020-12-11 MED ORDER — MOMETASONE FURO-FORMOTEROL FUM 200-5 MCG/ACT IN AERO
2.0000 | INHALATION_SPRAY | Freq: Two times a day (BID) | RESPIRATORY_TRACT | Status: DC
  Administered 2020-12-12: 2 via RESPIRATORY_TRACT
  Filled 2020-12-11: qty 8.8

## 2020-12-11 MED ORDER — SODIUM CHLORIDE 0.9% FLUSH: 3.0000 mL | Freq: Two times a day (BID) | INTRAVENOUS | Status: DC

## 2020-12-11 MED ORDER — MIDAZOLAM HCL 2 MG/2ML IJ SOLN
INTRAMUSCULAR | Status: DC | PRN
  Administered 2020-12-11: 2 mg via INTRAVENOUS

## 2020-12-11 MED ORDER — LORATADINE 10 MG PO TABS
10.0000 mg | ORAL_TABLET | Freq: Every day | ORAL | Status: DC
  Administered 2020-12-12: 10 mg via ORAL
  Filled 2020-12-11: qty 1

## 2020-12-11 MED ORDER — NITROGLYCERIN 0.4 MG SL SUBL: 0.4000 mg | SUBLINGUAL_TABLET | SUBLINGUAL | Status: DC | PRN

## 2020-12-11 MED ORDER — ASPIRIN EC 81 MG PO TBEC
81.0000 mg | DELAYED_RELEASE_TABLET | Freq: Every day | ORAL | Status: DC
  Administered 2020-12-12: 81 mg via ORAL
  Filled 2020-12-11: qty 1

## 2020-12-11 MED ORDER — LIDOCAINE HCL (PF) 1 % IJ SOLN
INTRAMUSCULAR | Status: AC
  Filled 2020-12-11: qty 30

## 2020-12-11 MED ORDER — FUROSEMIDE 10 MG/ML IJ SOLN
40.0000 mg | Freq: Once | INTRAMUSCULAR | Status: AC
  Administered 2020-12-11: 40 mg via INTRAVENOUS

## 2020-12-11 MED ORDER — PANTOPRAZOLE SODIUM 40 MG PO TBEC
40.0000 mg | DELAYED_RELEASE_TABLET | Freq: Every day | ORAL | Status: DC
  Administered 2020-12-12: 40 mg via ORAL
  Filled 2020-12-11: qty 1

## 2020-12-11 MED ORDER — CITALOPRAM HYDROBROMIDE 20 MG PO TABS
20.0000 mg | ORAL_TABLET | Freq: Every day | ORAL | Status: DC
  Administered 2020-12-12: 20 mg via ORAL
  Filled 2020-12-11: qty 1

## 2020-12-11 MED ORDER — SODIUM CHLORIDE 0.9% FLUSH: 3.0000 mL | INTRAVENOUS | Status: DC | PRN

## 2020-12-11 MED ORDER — SODIUM CHLORIDE 0.9 % IV SOLN: 250.0000 mL | INTRAVENOUS | Status: DC | PRN

## 2020-12-11 MED ORDER — HEPARIN (PORCINE) IN NACL 1000-0.9 UT/500ML-% IV SOLN
INTRAVENOUS | Status: AC
  Filled 2020-12-11: qty 500

## 2020-12-11 MED ORDER — FENTANYL CITRATE (PF) 100 MCG/2ML IJ SOLN
INTRAMUSCULAR | Status: DC | PRN
  Administered 2020-12-11: 50 ug via INTRAVENOUS

## 2020-12-11 MED ORDER — HYDRALAZINE HCL 20 MG/ML IJ SOLN: 10.0000 mg | INTRAMUSCULAR | Status: AC | PRN

## 2020-12-11 MED ORDER — MIDAZOLAM HCL 2 MG/2ML IJ SOLN
INTRAMUSCULAR | Status: AC
  Filled 2020-12-11: qty 2

## 2020-12-11 MED ORDER — ATORVASTATIN CALCIUM 40 MG PO TABS
40.0000 mg | ORAL_TABLET | Freq: Every day | ORAL | Status: DC
  Administered 2020-12-12: 40 mg via ORAL
  Filled 2020-12-11: qty 1

## 2020-12-11 MED ORDER — IOHEXOL 350 MG/ML SOLN
INTRAVENOUS | Status: DC | PRN
  Administered 2020-12-11: 75 mL

## 2020-12-11 MED ORDER — SODIUM CHLORIDE 0.9 % WEIGHT BASED INFUSION: 1.0000 mL/kg/h | INTRAVENOUS | Status: DC

## 2020-12-11 MED ORDER — METOPROLOL TARTRATE 25 MG PO TABS
25.0000 mg | ORAL_TABLET | Freq: Two times a day (BID) | ORAL | Status: DC
  Filled 2020-12-11: qty 1

## 2020-12-11 MED ORDER — CLOPIDOGREL BISULFATE 75 MG PO TABS
75.0000 mg | ORAL_TABLET | Freq: Every day | ORAL | Status: DC
  Administered 2020-12-12: 75 mg via ORAL
  Filled 2020-12-11: qty 1

## 2020-12-11 MED ORDER — LABETALOL HCL 5 MG/ML IV SOLN: 10.0000 mg | INTRAVENOUS | Status: AC | PRN

## 2020-12-11 MED ORDER — ALBUTEROL SULFATE (2.5 MG/3ML) 0.083% IN NEBU: 3.0000 mL | INHALATION_SOLUTION | Freq: Four times a day (QID) | RESPIRATORY_TRACT | Status: DC | PRN

## 2020-12-11 MED ORDER — MONTELUKAST SODIUM 10 MG PO TABS
10.0000 mg | ORAL_TABLET | Freq: Every day | ORAL | Status: DC
  Administered 2020-12-11: 10 mg via ORAL
  Filled 2020-12-11: qty 1

## 2020-12-11 MED ORDER — SODIUM CHLORIDE 0.9% FLUSH
3.0000 mL | Freq: Two times a day (BID) | INTRAVENOUS | Status: DC
  Administered 2020-12-11 – 2020-12-12 (×2): 3 mL via INTRAVENOUS

## 2020-12-11 MED ORDER — VERAPAMIL HCL 2.5 MG/ML IV SOLN
INTRAVENOUS | Status: AC
  Filled 2020-12-11: qty 2

## 2020-12-11 MED ORDER — VERAPAMIL HCL 2.5 MG/ML IV SOLN
INTRAVENOUS | Status: DC | PRN
  Administered 2020-12-11: 10 mL via INTRA_ARTERIAL

## 2020-12-11 MED ORDER — FENTANYL CITRATE (PF) 100 MCG/2ML IJ SOLN
INTRAMUSCULAR | Status: AC
  Filled 2020-12-11: qty 2

## 2020-12-11 MED ORDER — SODIUM CHLORIDE 0.9 % IV SOLN: INTRAVENOUS | Status: AC

## 2020-12-11 MED ORDER — ASPIRIN 81 MG PO CHEW
81.0000 mg | CHEWABLE_TABLET | ORAL | Status: DC
  Filled 2020-12-11: qty 1

## 2020-12-11 MED ORDER — LEVOTHYROXINE SODIUM 100 MCG PO TABS
100.0000 ug | ORAL_TABLET | Freq: Every day | ORAL | Status: DC
  Administered 2020-12-12: 100 ug via ORAL
  Filled 2020-12-11: qty 1

## 2020-12-11 MED ORDER — LOSARTAN POTASSIUM 25 MG PO TABS
25.0000 mg | ORAL_TABLET | Freq: Every day | ORAL | Status: DC
  Administered 2020-12-12: 25 mg via ORAL
  Filled 2020-12-11: qty 1

## 2020-12-11 MED ORDER — LIDOCAINE HCL (PF) 1 % IJ SOLN
INTRAMUSCULAR | Status: DC | PRN
  Administered 2020-12-11: 2 mL

## 2020-12-11 MED ORDER — SODIUM CHLORIDE 0.9 % WEIGHT BASED INFUSION
3.0000 mL/kg/h | INTRAVENOUS | Status: DC
  Administered 2020-12-11: 3 mL/kg/h via INTRAVENOUS

## 2020-12-11 MED ORDER — NITROGLYCERIN 1 MG/10 ML FOR IR/CATH LAB
INTRA_ARTERIAL | Status: AC
  Filled 2020-12-11: qty 10

## 2020-12-11 MED ORDER — ISOSORBIDE MONONITRATE ER 30 MG PO TB24
30.0000 mg | ORAL_TABLET | Freq: Every day | ORAL | Status: DC
  Administered 2020-12-12: 30 mg via ORAL
  Filled 2020-12-11: qty 1

## 2020-12-11 SURGICAL SUPPLY — 9 items

## 2020-12-11 NOTE — Interval H&P Note (Signed)
History and Physical Interval Note:  12/11/2020 10:24 AM  Bethany Beard  has presented today for surgery, with the diagnosis of unstable angina.  The various methods of treatment have been discussed with the patient and family. After consideration of risks, benefits and other options for treatment, the patient has consented to  Procedure(s): LEFT HEART CATH AND CORONARY ANGIOGRAPHY (N/A) as a surgical intervention.  The patient's history has been reviewed, patient examined, no change in status, stable for surgery.  I have reviewed the patient's chart and labs.  Questions were answered to the patient's satisfaction.    Cath Lab Visit (complete for each Cath Lab visit)  Clinical Evaluation Leading to the Procedure:   ACS: No.  Non-ACS:    Anginal Classification: CCS III  Anti-ischemic medical therapy: Maximal Therapy (2 or more classes of medications)  Non-Invasive Test Results: High-risk stress test findings: cardiac mortality >3%/year Coronary CTA with suggestion of high grade RCA and Circumflex disease  Prior CABG: No previous CABG        Verne Carrow

## 2020-12-11 NOTE — Progress Notes (Signed)
Interventional Cardiology PM Note  I reviewed the cath findings again with the patient. Her cath films were reviewed with the interventional team this afternoon. Her RCA has chronic, severe, heavily calcified proximal and mid vessel disease and would be high risk for atherectomy prior to stenting. This vessel is collateralized from left to right collaterals. The Circumflex is a small, diffusely disease vessel. The LAD does not have obstructive disease. The distal left main lesion is not obstructive.   At this time, I will recommend medical management of her CAD. If has lifestyle limiting angina, we can consider high risk PCI of the RCA which would require atherectomy and stenting from ostium into the distal vessel.   The patient agrees with this plan. Will start Plavix and Imdur. Continue beta blocker.   Verne Carrow, MD, [facility] 12/11/2020 4:46 PM

## 2020-12-11 NOTE — Research (Signed)
Identify Informed Consent   Subject Name: Bethany Beard  Subject met inclusion and exclusion criteria.  The informed consent form, study requirements and expectations were reviewed with the subject and questions and concerns were addressed prior to the signing of the consent form.  The subject verbalized understanding of the trial requirements.  The subject agreed to participate in the Identify trial and signed the informed consent at 1012 on 11-Dec-2020.  The informed consent was obtained prior to performance of any protocol-specific procedures for the subject.  A copy of the signed informed consent was given to the subject and a copy was placed in the subject's medical record.   Jasmine Pang, RN BSN [facility] Cardiovascular Research & Education Direct Line: [phone]

## 2020-12-12 ENCOUNTER — Other Ambulatory Visit (HOSPITAL_COMMUNITY): Payer: Self-pay

## 2020-12-12 ENCOUNTER — Encounter (HOSPITAL_COMMUNITY): Payer: Self-pay | Admitting: Cardiovascular Disease

## 2020-12-12 DIAGNOSIS — I200 Unstable angina: Secondary | ICD-10-CM

## 2020-12-12 DIAGNOSIS — I2582 Chronic total occlusion of coronary artery: Secondary | ICD-10-CM | POA: Diagnosis not present

## 2020-12-12 DIAGNOSIS — I25110 Atherosclerotic heart disease of native coronary artery with unstable angina pectoris: Secondary | ICD-10-CM | POA: Diagnosis not present

## 2020-12-12 DIAGNOSIS — Z79899 Other long term (current) drug therapy: Secondary | ICD-10-CM | POA: Diagnosis not present

## 2020-12-12 DIAGNOSIS — R0789 Other chest pain: Secondary | ICD-10-CM | POA: Diagnosis not present

## 2020-12-12 DIAGNOSIS — Z7982 Long term (current) use of aspirin: Secondary | ICD-10-CM | POA: Diagnosis not present

## 2020-12-12 DIAGNOSIS — J45909 Unspecified asthma, uncomplicated: Secondary | ICD-10-CM | POA: Diagnosis not present

## 2020-12-12 DIAGNOSIS — F17210 Nicotine dependence, cigarettes, uncomplicated: Secondary | ICD-10-CM | POA: Diagnosis not present

## 2020-12-12 DIAGNOSIS — I10 Essential (primary) hypertension: Secondary | ICD-10-CM | POA: Diagnosis not present

## 2020-12-12 LAB — BASIC METABOLIC PANEL
Anion gap: 4 — ABNORMAL LOW (ref 5–15)
BUN: 16 mg/dL (ref 6–20)
CO2: 29 mmol/L (ref 22–32)
Calcium: 9.3 mg/dL (ref 8.9–10.3)
Chloride: 109 mmol/L (ref 98–111)
Creatinine, Ser: 0.9 mg/dL (ref 0.44–1.00)
GFR, Estimated: >60 mL/min (ref >60)
Glucose, Bld: 111 mg/dL — ABNORMAL HIGH (ref 70–99)
Potassium: 4.5 mmol/L (ref 3.5–5.1)
Sodium: 142 mmol/L (ref 135–145)

## 2020-12-12 LAB — CBC
HCT: 30.8 % — ABNORMAL LOW (ref 36.0–46.0)
Hemoglobin: 9.4 g/dL — ABNORMAL LOW (ref 12.0–15.0)
MCH: 30.4 pg (ref 26.0–34.0)
MCHC: 30.5 g/dL (ref 30.0–36.0)
MCV: 99.7 fL (ref 80.0–100.0)
Platelets: 202 10*3/uL (ref 150–400)
RBC: 3.09 MIL/uL — ABNORMAL LOW (ref 3.87–5.11)
RDW: 15 % (ref 11.5–15.5)
WBC: 5.5 10*3/uL (ref 4.0–10.5)
nRBC: 0 % (ref 0.0–0.2)

## 2020-12-12 MED ORDER — PNEUMOCOCCAL VAC POLYVALENT 25 MCG/0.5ML IJ INJ: 0.5000 mL | INJECTION | INTRAMUSCULAR | Status: DC

## 2020-12-12 MED ORDER — PANTOPRAZOLE SODIUM 40 MG PO TBEC
40.0000 mg | DELAYED_RELEASE_TABLET | Freq: Every day | ORAL | 2 refills | Status: AC
  Filled 2020-12-12: qty 30, 30d supply, fill #0

## 2020-12-12 MED ORDER — ISOSORBIDE MONONITRATE ER 30 MG PO TB24
30.0000 mg | ORAL_TABLET | Freq: Every day | ORAL | 1 refills | Status: DC
  Filled 2020-12-12: qty 90, 90d supply, fill #0

## 2020-12-12 MED ORDER — FUROSEMIDE 10 MG/ML IJ SOLN
40.0000 mg | Freq: Once | INTRAMUSCULAR | Status: AC
  Administered 2020-12-12: 40 mg via INTRAVENOUS
  Filled 2020-12-12: qty 4

## 2020-12-12 MED ORDER — CLOPIDOGREL BISULFATE 75 MG PO TABS
75.0000 mg | ORAL_TABLET | Freq: Every day | ORAL | 1 refills | Status: DC
  Filled 2020-12-12: qty 90, 90d supply, fill #0

## 2020-12-12 MED FILL — Nitroglycerin IV Soln 100 MCG/ML in D5W: INTRA_ARTERIAL | Qty: 10 | Status: AC

## 2020-12-12 NOTE — Discharge Summary (Signed)
Discharge Summary    Patient ID: Bethany Beard MRN: [id number]; DOB: 06-10-1960  Admit date: 12/11/2020 Discharge date: 12/12/2020  PCP:  Jovita Kussmaul, MD (Inactive)   [facility] Providers Cardiologist:  Reatha Harps, MD    Discharge Diagnoses    Principal Problem:   Unstable angina [facility]) Active Problems:   HYPERCHOLESTEROLEMIA   GERD   Hypertension   Diagnostic Studies/Procedures    Cath: 12/11/20    Dist LM to Prox LAD lesion is 50% stenosed.   Mid LM to Dist LM lesion is 40% stenosed.   Ost Cx to Prox Cx lesion is 70% stenosed.   Ramus lesion is 60% stenosed.   Mid Cx to Dist Cx lesion is 95% stenosed.   Prox RCA lesion is 80% stenosed.   Prox RCA to Mid RCA lesion is 99% stenosed.   Mid RCA lesion is 99% stenosed.   RPAV lesion is 100% stenosed.   Severe heavily calcified proximal and mid RCA stenosis. Probable chronic occlusion of a posterolateral branch that is seen to fill from left to right collaterals. The PDA has competitive flow from left to right collaterals.  Mild eccentric distal left main stenosis Mild ostial LAD stenosis. The remainder of the LAD has no significant disease.  Severe proximal Circumflex stenosis. Moderately severe stenosis of the ostium of the moderate caliber obtuse marginal branch. Severe mid Circumflex stenosis.  Elevated LVEDP   Recommendations: She has complex multi-vessel CAD. The RCA can be treated percutaneously but this will require atherectomy and a long segment of stenting from the ostium through the mid to distal vessel. This would be high risk for complications given the appearance of the vessel and heavy calcification. Stenting of the proximal Circumflex may compromise flow into the high obtuse marginal branch and potentially the LAD given the distal left main stenosis and ostial LAD plaque. The Circumflex is overall small and diffusely diseased and not a good target for CABG.    After review with the IC team, we will  plan medical management of her CAD for now. I will add Plavix and Imdur. Continue beta blocker. If she has lifestyle limiting angina, we can consider high risk PCI of the RCA with orbital atherectomy and stenting.    Will diurese with IV Lasix today given elevated filling pressures.    Diagnostic Dominance: Right   _____________   History of Present Illness     Bethany Beard is a 60 y.o. female with a hx of tobacco abuse, hypertension, obesity who presents for follow-up.  Evaluated for atypical chest pain on 11/19/2021.  Coronary CTA with heavy calcifications.  CT FFR with concerns for obstructive mid RCA as well as mid left circumflex lesion.  She recently followed up in the office to review the results. She was still having symptoms and cardiac cath was discussed.  She did report an allergy to aspirin 50 years ago.  She had not been rechallenged.  She reported she cannot remember what the allergy was.  She was re-challenged and did ok. Risk and benefits of left heart catheterization discussed.  She has an echocardiogram tomorrow which she will do.  Hospital Course     CAD: presented for outpatient cardiac cath noted above with severe diffusely calcified disease in the p/dRCA which would be high risk for atherectomy prior to stenting. Area does have left to right collaterals. LCx noted to be a small vessel with diffuse disease. LAD with mild 40-50% proximal non-obstructive disease. Recommendations for medical management unless  she has lifestyle limiting angina.  -- started on ASA, plavix along with Imdur post cath.  -- elevated LVEDP on cath, given IV lasix x2 with improvement in symptoms   HLD: on statin (recently started at office visit) -- FLP/LFTs in 3 months   HTN: blood pressures are stable. HRs have been in the upper 30 to 40 range at times. Will hold metoprolol at discharge. -- continue losartan    GERD: on Prilosec PTA, will plan to transition to Protonix   Hx of tobacco use:  reports she has quit  Did the patient have an acute coronary syndrome (MI, NSTEMI, STEMI, etc) this admission?:  No                               Did the patient have a percutaneous coronary intervention (stent / angioplasty)?:  No.       _____________  Discharge Vitals Blood pressure 124/60, pulse (!) 54, temperature 98.5 F (36.9 C), temperature source Oral, resp. rate 18, height  (1.676 m), weight 127 kg, SpO2 99 %.  Filed Weights   12/11/20 0943  Weight: 127 kg    Labs & Radiologic Studies    CBC No results for input(s): WBC, NEUTROABS, HGB, HCT, MCV, PLT in the last 72 hours. Basic Metabolic Panel Recent Labs    16/10/96 1054  NA 142  K 4.5  CL 109  CO2 29  GLUCOSE 111*  BUN 16  CREATININE 0.90  CALCIUM 9.3   Liver Function Tests No results for input(s): AST, ALT, ALKPHOS, BILITOT, PROT, ALBUMIN in the last 72 hours. No results for input(s): LIPASE, AMYLASE in the last 72 hours. High Sensitivity Troponin:   No results for input(s): TROPONINIHS in the last 720 hours.  BNP Invalid input(s): POCBNP D-Dimer No results for input(s): DDIMER in the last 72 hours. Hemoglobin A1C No results for input(s): HGBA1C in the last 72 hours. Fasting Lipid Panel No results for input(s): CHOL, HDL, LDLCALC, TRIG, CHOLHDL, LDLDIRECT in the last 72 hours. Thyroid Function Tests No results for input(s): TSH, T4TOTAL, T3FREE, THYROIDAB in the last 72 hours.  Invalid input(s): FREET3 _____________  CARDIAC CATHETERIZATION  Addendum Date: 12/11/2020     Dist LM to Prox LAD lesion is 50% stenosed.   Mid LM to Dist LM lesion is 40% stenosed.   Ost Cx to Prox Cx lesion is 70% stenosed.   Ramus lesion is 60% stenosed.   Mid Cx to Dist Cx lesion is 95% stenosed.   Prox RCA lesion is 80% stenosed.   Prox RCA to Mid RCA lesion is 99% stenosed.   Mid RCA lesion is 99% stenosed.   RPAV lesion is 100% stenosed. Severe heavily calcified proximal and mid RCA stenosis. Probable chronic  occlusion of a posterolateral branch that is seen to fill from left to right collaterals. The PDA has competitive flow from left to right collaterals. Mild eccentric distal left main stenosis Mild ostial LAD stenosis. The remainder of the LAD has no significant disease. Severe proximal Circumflex stenosis. Moderately severe stenosis of the ostium of the moderate caliber obtuse marginal branch. Severe mid Circumflex stenosis. Elevated LVEDP Recommendations: She has complex multi-vessel CAD. The RCA can be treated percutaneously but this will require atherectomy and a long segment of stenting from the ostium through the mid to distal vessel. This would be high risk for complications given the appearance of the vessel and heavy calcification. Stenting of  the proximal Circumflex may compromise flow into the high obtuse marginal branch and potentially the LAD given the distal left main stenosis and ostial LAD plaque. The Circumflex is overall small and diffusely diseased and not a good target for CABG. After review with the IC team, we will plan medical management of her CAD for now. I will add Plavix and Imdur. Continue beta blocker. If she has lifestyle limiting angina, we can consider high risk PCI of the RCA with orbital atherectomy and stenting. Will diurese with IV Lasix today given elevated filling pressures.    Result Date: 12/11/2020   Dist LM to Prox LAD lesion is 50% stenosed.   Mid LM to Dist LM lesion is 40% stenosed.   Ost Cx to Prox Cx lesion is 70% stenosed.   Ramus lesion is 60% stenosed.   Mid Cx to Dist Cx lesion is 95% stenosed.   Prox RCA lesion is 80% stenosed.   Prox RCA to Mid RCA lesion is 99% stenosed.   Mid RCA lesion is 99% stenosed.   RPAV lesion is 100% stenosed. Severe heavily calcified proximal and mid RCA stenosis. Probable chronic occlusion of a posterolateral branch that is seen to fill from left to right collaterals. Moderate eccentric distal left main stenosis Moderate, hazy ostial  LAD stenosis. The remainder of the LAD has no significant disease. Severe proximal Circumflex stenosis. Moderately severe stenosis of the ostium of the moderate caliber obtuse marginal branch. Severe mid Circumflex stenosis. Recommendations: She has complex multi-vessel CAD. The RCA and Circumflex can be treated percutaneously but this will require atherectomy of the RCA and a long segment of stenting from the proximal through the mid to distal vessel. Stenting of the proximal Circumflex may compromise flow into the high obtuse marginal branch and potentially the LAD given the distal left main stenosis and ostial LAD plaque. We will also need to consider CABG. I will plan to admit her to telemetry and will review her cath films with the heart team to decide on multi-vessel PCI vs CT surgery consult for CABG.   CT CORONARY MORPH W/CTA COR W/SCORE W/CA W/CM &/OR WO/CM  Addendum Date: 11/28/2020   ADDENDUM REPORT: 11/28/2020 13:06 CLINICAL DATA:  61 year old female with chest pain, LDL 171, smoker, HTN, adopted with BMI 46. EXAM: Cardiac/Coronary  CTA TECHNIQUE: The patient was scanned on a [employer]. FINDINGS: A 130 kV prospective scan was triggered in the descending thoracic aorta at 111 HU's. Axial non-contrast 3 mm slices were carried out through the heart. The data set was analyzed on a dedicated work station and scored using the Agatson method. Gantry rotation speed was 250 msecs and collimation was .6 mm. No beta blockade and 0.8 mg of sl NTG was given. The 3D data set was reconstructed in 5% intervals of the 67-82 % of the R-R cycle. Diastolic phases were analyzed on a dedicated work station using MPR, MIP and VRT modes. The patient received 80 cc of contrast. Image quality is poor. Coronary arteries appear suboptimally filled but this may be secondary to morbid obesity. Aorta:  Normal size.  Mild calcifications.  No dissection. Aortic Valve:  Trileaflet.  No calcifications. Coronary Arteries:   Normal coronary origin.  Right dominance. RCA is a large dominant artery that gives rise to PDA and PLA. There is severe proximal to mid calcified plaque with areas of possible significant stenosis. Poor coronary filling. Left main is a large artery that gives rise to LAD, Ramus and  LCX arteries. There is distal left main small focal calcified plaque, 0-24%, non flow limiting. LAD had diffuse proximal calcified and non calcified plaque. There is 50% narrowing of the ostial region. There is proximal calcified plaque (14 mm in length) 0-24% stenosis. Sending for FFR. LCX is a non-dominant artery that gives rise to one large OM1 branch. There is dense proximal calcified plaque. Vessel is poorly filled. There is a ramus branch with spotty calcified plaque proximally. Portions may be flow limiting. Other findings: Normal pulmonary vein drainage into the left atrium. Normal left atrial appendage without a thrombus. Mildly dilated pulmonary artery, 30 mm. Please see radiology report for non cardiac findings. IMPRESSION: 1. Coronary calcium score of 1299. This was 39 percentile for age and sex matched control. 2. Normal coronary origin with right dominance. 3. Diffuse CAD. Sending for FFR if able to process. Suboptimal study. Recommend cardiac catheterization. Electronically Signed   By: Donato Schultz MD   On: 11/28/2020 13:06   Result Date: 11/28/2020 EXAM: OVER-READ INTERPRETATION  CT CHEST The following report is an over-read performed by radiologist Dr. Trudie Reed of [facility] on 11/28/2020. This over-read does not include interpretation of cardiac or coronary anatomy or pathology. The coronary calcium score/coronary CTA interpretation by the cardiologist is attached. COMPARISON:  None. FINDINGS: Atherosclerotic calcifications in the thoracic aorta. Within the visualized portions of the thorax there are no suspicious appearing pulmonary nodules or masses, there is no acute consolidative airspace  disease, no pleural effusions, no pneumothorax and no lymphadenopathy. Visualized portions of the upper abdomen are unremarkable. There are no aggressive appearing lytic or blastic lesions noted in the visualized portions of the skeleton. IMPRESSION: 1.  Aortic Atherosclerosis (ICD10-I70.0). Electronically Signed: By: Trudie Reed M.D. On: 11/28/2020 12:29   CT CORONARY FRACTIONAL FLOW RESERVE FLUID ANALYSIS  Addendum Date: 11/29/2020   ADDENDUM REPORT: 11/29/2020 06:24 EXAM: CT FFR ANALYSIS, second attempt. FINDINGS: CT FFR analysis was originally not accessible due to poor data quality. Resubmission by CT tech after reconstruction was performed on the original cardiac computed tomography angiogram dataset. This was able to pass. Diagrammatic representation of the CT FFR analysis is provided in a separate PDF document in PACS. This dictation was created using the PDF document and an interactive 3D model of the results. The 3D model is not available in the EMR/PACS. Normal CT FFR range is >0.80. 1. Left Main: No significant stenosis. 2. LAD: Ostium 0.91, mid 0.85, distal 0.79. 3. LCX: Proximal 0.85, mid 0.69. 4. RCA: Proximal 0.94 to 0.87, mid 0.63 to 0.54. IMPRESSION: 1. CT FFR analysis demonstrates 3 vessel stenosis. Recommend cardiac catheterization. Electronically Signed   By: Donato Schultz MD   On: 11/29/2020 06:24   Result Date: 11/29/2020 EXAM: FFRCT ANALYSIS FINDINGS: [facility] was unable to process imaging data. : FFR analysis was unable to be processed due to poor data quality Note: These examples are not recommendations of [facility] and only provided as examples of what other customers are doing. Electronically Signed: By: Donato Schultz MD On: 11/28/2020 15:51   ECHOCARDIOGRAM COMPLETE  Result Date: 12/04/2020    ECHOCARDIOGRAM REPORT   Patient Name:   Bethany Beard Date of Exam: 12/04/2020 Medical Rec #:  [id number]     Height:       66.0 in Accession #:    [id number]    Weight:       289.9 lb  Date of Birth:  1960-09-29     BSA:  2.342 m Patient Age:    60 years      BP:           158/70 mmHg Patient Gender: F             HR:           52 bpm. Exam Location:  [address] Procedure: 2D Echo, Cardiac Doppler, Color Doppler and Intracardiac            Opacification Agent Indications:    R07.2 Precordial pain  History:        Patient has no prior history of Echocardiogram examinations.  Sonographer:    Jorje Guild [facility], RDCS Referring Phys: [id number] [facility]'[place]  Sonographer Comments: Technically difficult study due to poor echo windows and suboptimal apical window. Image acquisition challenging due to patient body habitus. IMPRESSIONS  1. Left ventricular ejection fraction, by estimation, is 60 to 65%. The left ventricle has normal function. The left ventricle has no regional wall motion abnormalities. Left ventricular diastolic parameters were normal. Elevated left ventricular end-diastolic pressure.  2. Right ventricular systolic function is normal. The right ventricular size is normal.  3. The mitral valve is normal in structure. Mild mitral valve regurgitation. No evidence of mitral stenosis.  4. The aortic valve is tricuspid. Aortic valve regurgitation is mild. Mild to moderate aortic valve sclerosis/calcification is present, without any evidence of aortic stenosis. Aortic regurgitation PHT measures 487 msec.  5. The inferior vena cava is dilated in size with >50% respiratory variability, suggesting right atrial pressure of 8 mmHg. FINDINGS  Left Ventricle: Left ventricular ejection fraction, by estimation, is 60 to 65%. The left ventricle has normal function. The left ventricle has no regional wall motion abnormalities. Definity contrast agent was given IV to delineate the left ventricular  endocardial borders. The left ventricular internal cavity size was normal in size. There is no left ventricular hypertrophy. Left ventricular diastolic parameters were normal. Elevated left  ventricular end-diastolic pressure. Right Ventricle: The right ventricular size is normal. No increase in right ventricular wall thickness. Right ventricular systolic function is normal. Left Atrium: Left atrial size was normal in size. Right Atrium: Right atrial size was normal in size. Pericardium: There is no evidence of pericardial effusion. Mitral Valve: The mitral valve is normal in structure. Mild mitral valve regurgitation. No evidence of mitral valve stenosis. MV peak gradient, 8.8 mmHg. The mean mitral valve gradient is 3.0 mmHg. Tricuspid Valve: The tricuspid valve is normal in structure. Tricuspid valve regurgitation is not demonstrated. No evidence of tricuspid stenosis. Aortic Valve: The aortic valve is tricuspid. There is moderate aortic valve annular calcification. Aortic valve regurgitation is mild. Aortic regurgitation PHT measures 487 msec. Mild to moderate aortic valve sclerosis/calcification is present, without any evidence of aortic stenosis. Pulmonic Valve: The pulmonic valve was normal in structure. Pulmonic valve regurgitation is not visualized. No evidence of pulmonic stenosis. Aorta: The aortic root is normal in size and structure. Venous: The inferior vena cava is dilated in size with greater than 50% respiratory variability, suggesting right atrial pressure of 8 mmHg. IAS/Shunts: No atrial level shunt detected by color flow Doppler.  LEFT VENTRICLE PLAX 2D LVIDd:         5.40 cm  Diastology LVIDs:         3.50 cm  LV e' medial:    8.78 cm/s LV PW:         1.00 cm  LV E/e' medial:  14.5 LV IVS:  0.90 cm  LV e' lateral:   7.15 cm/s LVOT diam:     2.10 cm  LV E/e' lateral: 17.8 LV SV:         107 LV SV Index:   46 LVOT Area:     3.46 cm  RIGHT VENTRICLE RV Basal diam:  3.20 cm RV S prime:     13.70 cm/s TAPSE (M-mode): 2.4 cm LEFT ATRIUM             Index       RIGHT ATRIUM           Index LA diam:        3.70 cm 1.58 cm/m  RA Pressure: 3.00 mmHg LA Vol (A2C):   61.2 ml 26.14 ml/m  RA Area:     15.10 cm LA Vol (A4C):   81.9 ml 34.98 ml/m RA Volume:   33.00 ml  14.09 ml/m LA Biplane Vol: 71.5 ml 30.54 ml/m  AORTIC VALVE LVOT Vmax:   109.00 cm/s LVOT Vmean:  81.200 cm/s LVOT VTI:    0.310 m AI PHT:      487 msec  AORTA Ao Root diam: 3.20 cm Ao Asc diam:  3.10 cm MITRAL VALVE                TRICUSPID VALVE MV Area (PHT): 4.63 cm     Estimated RAP:  3.00 mmHg MV Area VTI:   2.14 cm MV Peak grad:  8.8 mmHg     SHUNTS MV Mean grad:  3.0 mmHg     Systemic VTI:  0.31 m MV Vmax:       1.48 m/s     Systemic Diam: 2.10 cm MV Vmean:      74.4 cm/s MV Decel Time: 164 msec MV E velocity: 127.00 cm/s MV A velocity: 141.00 cm/s MV E/A ratio:  0.90 Armanda Magic MD Electronically signed by Armanda Magic MD Signature Date/Time: 12/04/2020/9:29:20 AM    Final    Disposition   Pt is being discharged home today in good condition.  Follow-up Plans & Appointments     Follow-up Information     O'Neal, Ronnald Ramp, MD Follow up on 12/24/2020.   Specialties: Cardiology, Internal Medicine, Radiology Why: at 9am for your follow up appt Contact information: [address] [place] [address] [phone]                Discharge Instructions     Call MD for:  redness, tenderness, or signs of infection (pain, swelling, redness, odor or green/yellow discharge around incision site)   Complete by: As directed    Diet - low sodium heart healthy   Complete by: As directed    Discharge instructions   Complete by: As directed    Radial Site Care Refer to this sheet in the next few weeks. These instructions provide you with information on caring for yourself after your procedure. Your caregiver may also give you more specific instructions. Your treatment has been planned according to current medical practices, but problems sometimes occur. Call your caregiver if you have any problems or questions after your procedure. HOME CARE INSTRUCTIONS You may shower the day after the procedure.  Remove the bandage (dressing) and gently wash the site with plain soap and water. Gently pat the site dry.  Do not apply powder or lotion to the site.  Do not submerge the affected site in water for 3 to 5 days.  Inspect the site at least twice daily.  Do not flex or bend the affected arm for 24 hours.  No lifting over 5 pounds (2.3 kg) for 5 days after your procedure.  Do not drive home if you are discharged the same day of the procedure. Have someone else drive you.  You may drive 24 hours after the procedure unless otherwise instructed by your caregiver.  What to expect: Any bruising will usually fade within 1 to 2 weeks.  Blood that collects in the tissue (hematoma) may be painful to the touch. It should usually decrease in size and tenderness within 1 to 2 weeks.  SEEK IMMEDIATE MEDICAL CARE IF: You have unusual pain at the radial site.  You have redness, warmth, swelling, or pain at the radial site.  You have drainage (other than a small amount of blood on the dressing).  You have chills.  You have a fever or persistent symptoms for more than 72 hours.  You have a fever and your symptoms suddenly get worse.  Your arm becomes pale, cool, tingly, or numb.  You have heavy bleeding from the site. Hold pressure on the site.   Since you have started on ASA and plavix it is recommended that you stop the celebrex as this will increase your risk of bleeding. Good alternative would be tylenol if able to tolerate.   Increase activity slowly   Complete by: As directed        Discharge Medications   Allergies as of 12/12/2020       Reactions   Aspirin Other (See Comments)   Face redness/broken blood vessels red Per patient doctor told her to try 12/05/20   Avocado Other (See Comments)   Was told that she was allergic but has never tried them.    Codeine Itching, Swelling   Other Other (See Comments)   Environmental and cats   Strawberry Extract Itching, Rash   Tomato Itching, Rash         Medication List     STOP taking these medications    celecoxib 100 MG capsule Commonly known as: CeleBREX   metoprolol tartrate 25 MG tablet Commonly known as: LOPRESSOR   omeprazole 20 MG capsule Commonly known as: PRILOSEC Replaced by: pantoprazole 40 MG tablet       TAKE these medications    acetaminophen 650 MG CR tablet Commonly known as: TYLENOL Take 1,950 mg by mouth every 8 (eight) hours as needed for pain.   albuterol 108 (90 Base) MCG/ACT inhaler Commonly known as: VENTOLIN HFA Inhale 2 puffs into the lungs every 6 (six) hours as needed for wheezing or shortness of breath (Asthma).   aspirin EC 81 MG tablet Take 1 tablet (81 mg total) by mouth daily. Swallow whole.   atorvastatin 40 MG tablet Commonly known as: LIPITOR Take 1 tablet (40 mg total) by mouth daily.   budesonide-formoterol 160-4.5 MCG/ACT inhaler Commonly known as: SYMBICORT Inhale 2 puffs into the lungs 2 (two) times daily.   citalopram 20 MG tablet Commonly known as: CELEXA Take 1 tablet by mouth once daily   clopidogrel 75 MG tablet Commonly known as: PLAVIX Take 1 tablet (75 mg total) by mouth daily. Start taking on: December 13, 2020   fexofenadine 180 MG tablet Commonly known as: Allegra Allergy Take 1 tablet (180 mg total) by mouth daily.   isosorbide mononitrate 30 MG 24 hr tablet Commonly known as: IMDUR Take 1 tablet (30 mg total) by mouth daily. Start taking on: December 13, 2020   levothyroxine 100  MCG tablet Commonly known as: SYNTHROID Take 1 tablet (100 mcg total) by mouth every morning. 30 minutes before food   losartan 25 MG tablet Commonly known as: COZAAR Take 1 tablet (25 mg total) by mouth daily.   montelukast 10 MG tablet Commonly known as: SINGULAIR Take 1 tablet (10 mg total) by mouth at bedtime.   nitroGLYCERIN 0.4 MG SL tablet Commonly known as: NITROSTAT Place 1 tablet (0.4 mg total) under the tongue every 5 (five) minutes as needed for  chest pain.   pantoprazole 40 MG tablet Commonly known as: PROTONIX Take 1 tablet (40 mg total) by mouth daily. Start taking on: December 13, 2020 Replaces: omeprazole 20 MG capsule          Outstanding Labs/Studies   N/a   Duration of Discharge Encounter   Greater than 30 minutes including physician time.  Signed, Laverda PageLindsay Francessca Friis, NP 12/12/2020, 11:54 AM

## 2020-12-12 NOTE — Progress Notes (Signed)
The patient's heart rate has been maintaining in the 40s and 50s.  Her latest blood pressure was 110/88. Due to her bradycardia, the nurse paged the on call physician to ask if the 25 mg Metoprolol should be given tonight.  The doctor called and responded to hold the Metoprolol.  The patient is resting comfortably in bed with the call bell within reach.  Will continue to monitor.  Harriet Masson, RN

## 2020-12-12 NOTE — Progress Notes (Addendum)
Progress Note  Patient Name: Bethany Beard Date of Encounter: 12/12/2020  [facility] Cardiologist: None   Subjective   Some chest pain overnight and stills feeling mildly short of breath.   Inpatient Medications    Scheduled Meds:  aspirin EC  81 mg Oral Daily   atorvastatin  40 mg Oral Daily   celecoxib  100 mg Oral BID   citalopram  20 mg Oral Daily   clopidogrel  75 mg Oral Daily   furosemide  40 mg Intravenous Once   isosorbide mononitrate  30 mg Oral Daily   levothyroxine  100 mcg Oral Q0600   loratadine  10 mg Oral Daily   losartan  25 mg Oral Daily   metoprolol tartrate  25 mg Oral BID   mometasone-formoterol  2 puff Inhalation BID   montelukast  10 mg Oral QHS   pantoprazole  40 mg Oral Daily   [START ON 12/13/2020] pneumococcal 23 valent vaccine  0.5 mL Intramuscular Tomorrow-1000   sodium chloride flush  3 mL Intravenous Q12H   Continuous Infusions:  sodium chloride     PRN Meds: sodium chloride, acetaminophen, albuterol, nitroGLYCERIN, ondansetron (ZOFRAN) IV, sodium chloride flush   Vital Signs    Vitals:   12/11/20 2321 12/12/20 0347 12/12/20 0728 12/12/20 0822  BP: (!) 125/98 (!) 113/59 124/60   Pulse: (!) 50 70 (!) 54   Resp: 20 20 18    Temp: 98.3 F (36.8 C) 98.3 F (36.8 C) 98.5 F (36.9 C)   TempSrc: Oral Oral Oral   SpO2: 95% 96% 96% 99%  Weight:      Height:        Intake/Output Summary (Last 24 hours) at 12/12/2020 0937 Last data filed at 12/11/2020 2009 Gross per 24 hour  Intake 250 ml  Output --  Net 250 ml   Last 3 Weights 12/11/2020 12/03/2020 11/19/2020  Weight (lbs) 280 lb 289 lb 14.4 oz 285 lb 12.8 oz  Weight (kg) 127.007 kg 131.498 kg 129.638 kg      Telemetry    SR-->SB - Personally Reviewed  ECG   No new tracing  Physical Exam   GEN: No acute distress.   Neck: No JVD Cardiac: RRR, no murmurs, rubs, or gallops.  Respiratory: Diminished in bases GI: Soft, nontender, non-distended  MS: No edema; No deformity.  Right radial cath site stable. Neuro:  Nonfocal  Psych: Normal affect   Labs    High Sensitivity Troponin:  No results for input(s): TROPONINIHS in the last 720 hours.    ChemistryNo results for input(s): NA, K, CL, CO2, GLUCOSE, BUN, CREATININE, CALCIUM, PROT, ALBUMIN, AST, ALT, ALKPHOS, BILITOT, GFRNONAA, GFRAA, ANIONGAP in the last 168 hours.   HematologyNo results for input(s): WBC, RBC, HGB, HCT, MCV, MCH, MCHC, RDW, PLT in the last 168 hours.  BNPNo results for input(s): BNP, PROBNP in the last 168 hours.   DDimer No results for input(s): DDIMER in the last 168 hours.   Radiology    CARDIAC CATHETERIZATION  Addendum Date: 12/11/2020     Dist LM to Prox LAD lesion is 50% stenosed.   Mid LM to Dist LM lesion is 40% stenosed.   Ost Cx to Prox Cx lesion is 70% stenosed.   Ramus lesion is 60% stenosed.   Mid Cx to Dist Cx lesion is 95% stenosed.   Prox RCA lesion is 80% stenosed.   Prox RCA to Mid RCA lesion is 99% stenosed.   Mid RCA lesion is 99% stenosed.  RPAV lesion is 100% stenosed. Severe heavily calcified proximal and mid RCA stenosis. Probable chronic occlusion of a posterolateral branch that is seen to fill from left to right collaterals. The PDA has competitive flow from left to right collaterals. Mild eccentric distal left main stenosis Mild ostial LAD stenosis. The remainder of the LAD has no significant disease. Severe proximal Circumflex stenosis. Moderately severe stenosis of the ostium of the moderate caliber obtuse marginal branch. Severe mid Circumflex stenosis. Elevated LVEDP Recommendations: She has complex multi-vessel CAD. The RCA can be treated percutaneously but this will require atherectomy and a long segment of stenting from the ostium through the mid to distal vessel. This would be high risk for complications given the appearance of the vessel and heavy calcification. Stenting of the proximal Circumflex may compromise flow into the high obtuse marginal branch and  potentially the LAD given the distal left main stenosis and ostial LAD plaque. The Circumflex is overall small and diffusely diseased and not a good target for CABG. After review with the IC team, we will plan medical management of her CAD for now. I will add Plavix and Imdur. Continue beta blocker. If she has lifestyle limiting angina, we can consider high risk PCI of the RCA with orbital atherectomy and stenting. Will diurese with IV Lasix today given elevated filling pressures.    Result Date: 12/11/2020   Dist LM to Prox LAD lesion is 50% stenosed.   Mid LM to Dist LM lesion is 40% stenosed.   Ost Cx to Prox Cx lesion is 70% stenosed.   Ramus lesion is 60% stenosed.   Mid Cx to Dist Cx lesion is 95% stenosed.   Prox RCA lesion is 80% stenosed.   Prox RCA to Mid RCA lesion is 99% stenosed.   Mid RCA lesion is 99% stenosed.   RPAV lesion is 100% stenosed. Severe heavily calcified proximal and mid RCA stenosis. Probable chronic occlusion of a posterolateral branch that is seen to fill from left to right collaterals. Moderate eccentric distal left main stenosis Moderate, hazy ostial LAD stenosis. The remainder of the LAD has no significant disease. Severe proximal Circumflex stenosis. Moderately severe stenosis of the ostium of the moderate caliber obtuse marginal branch. Severe mid Circumflex stenosis. Recommendations: She has complex multi-vessel CAD. The RCA and Circumflex can be treated percutaneously but this will require atherectomy of the RCA and a long segment of stenting from the proximal through the mid to distal vessel. Stenting of the proximal Circumflex may compromise flow into the high obtuse marginal branch and potentially the LAD given the distal left main stenosis and ostial LAD plaque. We will also need to consider CABG. I will plan to admit her to telemetry and will review her cath films with the heart team to decide on multi-vessel PCI vs CT surgery consult for CABG.    Cardiac Studies    Cath: 12/11/20   Dist LM to Prox LAD lesion is 50% stenosed.   Mid LM to Dist LM lesion is 40% stenosed.   Ost Cx to Prox Cx lesion is 70% stenosed.   Ramus lesion is 60% stenosed.   Mid Cx to Dist Cx lesion is 95% stenosed.   Prox RCA lesion is 80% stenosed.   Prox RCA to Mid RCA lesion is 99% stenosed.   Mid RCA lesion is 99% stenosed.   RPAV lesion is 100% stenosed.   Severe heavily calcified proximal and mid RCA stenosis. Probable chronic occlusion of a posterolateral branch that  is seen to fill from left to right collaterals. The PDA has competitive flow from left to right collaterals.  Mild eccentric distal left main stenosis Mild ostial LAD stenosis. The remainder of the LAD has no significant disease.  Severe proximal Circumflex stenosis. Moderately severe stenosis of the ostium of the moderate caliber obtuse marginal branch. Severe mid Circumflex stenosis.  Elevated LVEDP   Recommendations: She has complex multi-vessel CAD. The RCA can be treated percutaneously but this will require atherectomy and a long segment of stenting from the ostium through the mid to distal vessel. This would be high risk for complications given the appearance of the vessel and heavy calcification. Stenting of the proximal Circumflex may compromise flow into the high obtuse marginal branch and potentially the LAD given the distal left main stenosis and ostial LAD plaque. The Circumflex is overall small and diffusely diseased and not a good target for CABG.    After review with the IC team, we will plan medical management of her CAD for now. I will add Plavix and Imdur. Continue beta blocker. If she has lifestyle limiting angina, we can consider high risk PCI of the RCA with orbital atherectomy and stenting.    Will diurese with IV Lasix today given elevated filling pressures.   Diagnostic Dominance: Right   Patient Profile     60 y.o. female with PMH of tobacco use, HTN, obesity, and abnormal  coronary CT. Presented for outpatient cardiac cath.   Assessment & Plan    CAD: presented for outpatient cardiac cath noted above with severe diffusely calcified disease in the p/dRCA which would be high risk for atherectomy prior to stenting. Area does have left to right collaterals. LCx noted to be a small vessel with diffuse disease. LAD with mild 40-50% proximal non-obstructive disease. Recommendations for medical management.  -- started on ASA, plavix along with Imdur post cath. Will have her ambulate in the hallway with plans for discharge later this afternoon -- elevated LVEDP on cath, given IV lasix yesterday. Reports freq UOP, but not documented. Still somewhat short of breath this morning, will re-dose x1 40mg  now  HLD: on statin (recently started at office visit) -- FLP/LFTs in 3 months  HTN: blood pressures are stable. HRs have been in the upper 30 to 40 range at times. Will hold metoprolol at discharge. -- continue losartan   GERD: on Prilosec PTA, will plan to transition to Protonix  Hx of tobacco use: reports she has quit  For questions or updates, please contact [facility] Please consult [email or web address] for contact info under        Signed, , NP  12/12/2020, 9:37 AM    Patient seen, examined. Available data reviewed. Agree with findings, assessment, and plan as outlined by 12/14/2020, NP.  On exam, this is an obese woman in no distress.  Lungs are clear, heart is regular rate and rhythm with no murmur or gallop, right radial site is clear, abdomen soft and nontender, extremities are without edema.  I agree with the plan as documented above.  I think the patient is medically stable for discharge, but reasonable to give her another dose of IV furosemide this morning.  I agree that her metoprolol should be held in the setting of bradycardia.  Isosorbide will be initiated.  Atherectomy and PCI of the RCA can be performed if she fails medical therapy.  This  would be a higher risk procedure and I agree with Dr. Laverda Page that it is  most prudent to try her on medical therapy as a first-line approach.  Tonny Bollman, M.D. 12/12/2020 10:14 AM

## 2020-12-12 NOTE — Progress Notes (Signed)
   12/12/20 0950  Clinical Encounter Type  Visited With Patient  Visit Type Initial  Referral From Nurse  Consult/Referral To Chaplain   Chaplain responded to the consult. Provided Advance Directive education. The patient said she would inform her nurse once she completed and ready for notarization.This note was prepared by Deneen Harts, M.Div..  For questions please contact by phone [phone].

## 2020-12-12 NOTE — Progress Notes (Signed)
Pt discharging home with family.  All questions have been answered, medications reviewed, and follow up appts in place.  Pt and family voice understanding.  TOC meds have been delivered.

## 2020-12-23 NOTE — Progress Notes (Signed)
Cardiology Office Note:   Date:  12/24/2020  NAME:  Bethany Beard    MRN: [id number] DOB:  17-Oct-1960   PCP:  Reece Leader, DO  Cardiologist:  Reatha Harps, MD  Electrophysiologist:  None   Referring MD: Jovita Kussmaul, MD   Chief Complaint  Patient presents with   Follow-up    History of Present Illness:   Bethany Beard is a 60 y.o. female with a hx of 2-vessel CAD, HTN, HLD, tobacco abuse, HFpEF who presents for follow-up. LHC confirmed findings on CCTA. Medical management recommended.  She reports she is still having daily chest pain episodes.  Blood pressures not controlled 176/76.  EKG demonstrates sinus bradycardia heart rate 55 with no acute ischemic changes or evidence of infarction.  Her right radial cath site is clean and dry.  She has not returned to work.  I recommended she try to go back.  She did have evidence of diastolic heart failure on her cardiac catheterization.  We need to control this.  She has stopped smoking.  She reports that when she exerts herself with heavy activity she can get a central squeezing chest pressure.  Symptoms do resolve with cessation of activity.  I have informed her that we have recommended a trial of aggressive medical therapy.  We need to see how well we can control her symptoms with this.  She does understand.  She will return back to work.  Lipids need to be better controlled.  Reassuring that her EKG shows no infarct or ischemia.  Problem List HTN Tobacco abuse Asthma HLD -T chol 274, HDL 68, LDL 171, TG 192 5. Obstructive CAD -99% mid RCA, 100% dRCA with L to R collaterals -95% dLCX (Small) -40% LM -50% pLAD -medical management recommended  6. HFpEF -LVEDP 36 mmHG at [facility] 12/11/2020  Past Medical History: Past Medical History:  Diagnosis Date   Asthma    Depression    Hyperlipidemia    Migraines    Pneumonia    Thyroid disease     Past Surgical History: Past Surgical History:  Procedure Laterality Date   BACK SURGERY      carpel     CHOLECYSTECTOMY     LEFT HEART CATH AND CORONARY ANGIOGRAPHY N/A 12/11/2020   Procedure: LEFT HEART CATH AND CORONARY ANGIOGRAPHY;  Surgeon: Kathleene Hazel, MD;  Location: [facility];  Service: Cardiovascular;  Laterality: N/A;   TONSILLECTOMY      Current Medications: Current Meds  Medication Sig   acetaminophen (TYLENOL) 650 MG CR tablet Take 1,950 mg by mouth every 8 (eight) hours as needed for pain.   albuterol (VENTOLIN HFA) 108 (90 Base) MCG/ACT inhaler Inhale 2 puffs into the lungs every 6 (six) hours as needed for wheezing or shortness of breath (Asthma).   amLODipine (NORVASC) 10 MG tablet Take 1 tablet (10 mg total) by mouth daily.   atorvastatin (LIPITOR) 40 MG tablet Take 1 tablet (40 mg total) by mouth daily.   budesonide-formoterol (SYMBICORT) 160-4.5 MCG/ACT inhaler Inhale 2 puffs into the lungs 2 (two) times daily.   citalopram (CELEXA) 20 MG tablet Take 1 tablet by mouth once daily   furosemide (LASIX) 20 MG tablet Take 1 tablet (20 mg total) by mouth daily.   levothyroxine (SYNTHROID) 100 MCG tablet Take 1 tablet (100 mcg total) by mouth every morning. 30 minutes before food   montelukast (SINGULAIR) 10 MG tablet Take 1 tablet (10 mg total) by mouth at bedtime.   pantoprazole (PROTONIX)  40 MG tablet Take 1 tablet (40 mg total) by mouth daily.   potassium chloride SA (KLOR-CON) 10 MEQ tablet Take 1 tablet (10 mEq total) by mouth daily.   [DISCONTINUED] aspirin EC 81 MG tablet Take 1 tablet (81 mg total) by mouth daily. Swallow whole.   [DISCONTINUED] clopidogrel (PLAVIX) 75 MG tablet Take 1 tablet (75 mg total) by mouth daily.   [DISCONTINUED] isosorbide mononitrate (IMDUR) 30 MG 24 hr tablet Take 1 tablet (30 mg total) by mouth daily.   [DISCONTINUED] losartan (COZAAR) 25 MG tablet Take 1 tablet (25 mg total) by mouth daily.   [DISCONTINUED] nitroGLYCERIN (NITROSTAT) 0.4 MG SL tablet Place 1 tablet (0.4 mg total) under the tongue every 5 (five)  minutes as needed for chest pain.     Allergies:    Aspirin, Avocado, Codeine, Other, Strawberry extract, and Tomato   Social History: Social History   Socioeconomic History   Marital status: Widowed    Spouse name: Not on file   Number of children: 1   Years of education: Not on file   Highest education level: Not on file  Occupational History   Occupation: [employer]  Tobacco Use   Smoking status: Every Day    Packs/day: 1.00    Years: 45.00    Pack years: 45.00    Types: Cigarettes   Smokeless tobacco: Never  Vaping Use   Vaping Use: Never used  Substance and Sexual Activity   Alcohol use: No    Comment: somtimes   Drug use: No   Sexual activity: Never  Other Topics Concern   Not on file  Social History Narrative   Not on file   Social Determinants of Health   Financial Resource Strain: Not on file  Food Insecurity: Not on file  Transportation Needs: Not on file  Physical Activity: Not on file  Stress: Not on file  Social Connections: Not on file     Family History: The patient's family history is negative for Cancer, Diabetes, Hyperlipidemia, Hypertension, Kidney disease, and Stroke. She was adopted.  ROS:   All other ROS reviewed and negative. Pertinent positives noted in the HPI.     EKGs/Labs/Other Studies Reviewed:   The following studies were personally reviewed by me today:  EKG:  EKG is ordered today.  The ekg ordered today demonstrates sinus bradycardia heart rate 55, no acute ischemic changes or evidence of infarction, and was personally reviewed by me.  LHC 12/11/2020   Dist LM to Prox LAD lesion is 50% stenosed.   Mid LM to Dist LM lesion is 40% stenosed.   Ost Cx to Prox Cx lesion is 70% stenosed.   Ramus lesion is 60% stenosed.   Mid Cx to Dist Cx lesion is 95% stenosed.   Prox RCA lesion is 80% stenosed.   Prox RCA to Mid RCA lesion is 99% stenosed.   Mid RCA lesion is 99% stenosed.   RPAV lesion is 100% stenosed.   Severe heavily  calcified proximal and mid RCA stenosis. Probable chronic occlusion of a posterolateral branch that is seen to fill from left to right collaterals. The PDA has competitive flow from left to right collaterals.  Mild eccentric distal left main stenosis Mild ostial LAD stenosis. The remainder of the LAD has no significant disease.  Severe proximal Circumflex stenosis. Moderately severe stenosis of the ostium of the moderate caliber obtuse marginal branch. Severe mid Circumflex stenosis.  Elevated LVEDP   TTE 12/04/2020   1. Left ventricular  ejection fraction, by estimation, is 60 to 65%. The  left ventricle has normal function. The left ventricle has no regional  wall motion abnormalities. Left ventricular diastolic parameters were  normal. Elevated left ventricular  end-diastolic pressure.   2. Right ventricular systolic function is normal. The right ventricular  size is normal.   3. The mitral valve is normal in structure. Mild mitral valve  regurgitation. No evidence of mitral stenosis.   4. The aortic valve is tricuspid. Aortic valve regurgitation is mild.  Mild to moderate aortic valve sclerosis/calcification is present, without  any evidence of aortic stenosis. Aortic regurgitation PHT measures 487  msec.   5. The inferior vena cava is dilated in size with >50% respiratory  variability, suggesting right atrial pressure of 8 mmHg.   Recent Labs: 06/24/2020: ALT 13 12/03/2020: TSH 5.050 12/12/2020: BUN 16; Creatinine, Ser 0.90; Hemoglobin 9.4; Platelets 202; Potassium 4.5; Sodium 142   Recent Lipid Panel    Component Value Date/Time   CHOL 274 (H) 06/24/2020 1059   TRIG 192 (H) 06/24/2020 1059   HDL 68 06/24/2020 1059   CHOLHDL 4.0 06/24/2020 1059   CHOLHDL 4 01/04/2012 1530   VLDL 24.8 01/04/2012 1530   LDLCALC 171 (H) 06/24/2020 1059   LDLDIRECT 207.2 01/04/2012 1530    Physical Exam:   VS:  BP (!) 176/76   Pulse (!) 55   Ht 5\' 6"  (1.676 m)   Wt 282 lb 6.4 oz (128.1 kg)    SpO2 97%   BMI 45.58 kg/m    Wt Readings from Last 3 Encounters:  12/24/20 282 lb 6.4 oz (128.1 kg)  12/11/20 280 lb (127 kg)  12/03/20 289 lb 14.4 oz (131.5 kg)    General: Well nourished, well developed, in no acute distress Head: Atraumatic, normal size  Eyes: PEERLA, EOMI  Neck: Supple, no JVD Endocrine: No thryomegaly Cardiac: Normal S1, S2; RRR; no murmurs, rubs, or gallops Lungs: Clear to auscultation bilaterally, no wheezing, rhonchi or rales  Abd: Soft, nontender, no hepatomegaly  Ext: No edema, pulses 2+ Musculoskeletal: No deformities, BUE and BLE strength normal and equal Skin: Warm and dry, no rashes   Neuro: Alert and oriented to person, place, time, and situation, CNII-XII grossly intact, no focal deficits  Psych: Normal mood and affect   ASSESSMENT:   Bethany Beard is a 60 y.o. female who presents for the following: 1. Coronary artery disease of native artery of native heart with stable angina pectoris (HCC)   2. Mixed hyperlipidemia   3. Chronic diastolic heart failure (HCC)   4. Primary hypertension   5. Tobacco abuse     PLAN:   1. Coronary artery disease of native artery of native heart with stable angina pectoris (HCC) 2. Mixed hyperlipidemia -99% mid RCA, 100% dRCA with L to R collaterals -95% dLCX (Small) -40% LM -50% pLAD -medical management recommended  -Significant CAD.  Likely has small vessel stuff as well.  Has good collateral flow to the RCA.  Medical management was recommended.  Her blood pressure is not controlled.  We will add amlodipine 10 mg daily.  She will continue losartan 25 mg daily.  She is on Imdur 30.  We will further titrate this to see if we can reduce her symptoms. -I have recommended she stay on aspirin and Plavix.  No bleeding. -She will continue Lipitor 40 mg daily.  Repeat lipid profile in the next 3 months.  I will see her back and she will obtain a fasting  profile 1 week before. -Counseled extensively on symptoms that may  be becoming unstable.  Should she have prolonged symptoms that arise upon her nitro she should present to the emergency room.  Intervention to the RCA could be considered however I feel most of this is small vessel disease and can be managed medically. -Echocardiogram shows normal LV function.  She did have elevated filling pressures at the time of cath.  We will treat this below. -We will also refer to cardiac rehab to see if she can benefit from this.  I think exercise would be much better for her. -Short-term disability paperwork filled out in the office.  3. Chronic diastolic heart failure (HCC) -LVEDP 36 mmHg the time of cath.  No evidence of gross volume overload.  I will start her on Lasix 20 mg daily with 10 mEq potassium. -Advised to avoid salt intake. -We need to control her blood pressure.  She has quit smoking.  4. Primary hypertension -Add amlodipine 10 mg daily.  Continue losartan 25 mg daily.  Continue Imdur 30 mg daily.  5. Tobacco abuse -She reports she quit smoking.  I have encouraged her to remain this way.   Disposition: Return in about 3 months (around 03/25/2021).  Medication Adjustments/Labs and Tests Ordered: Current medicines are reviewed at length with the patient today.  Concerns regarding medicines are outlined above.  Orders Placed This Encounter  Procedures   Lipid panel   AMB referral to cardiac rehabilitation    Meds ordered this encounter  Medications   nitroGLYCERIN (NITROSTAT) 0.4 MG SL tablet    Sig: Place 1 tablet (0.4 mg total) under the tongue every 5 (five) minutes as needed for chest pain.    Dispense:  25 tablet    Refill:  2   losartan (COZAAR) 25 MG tablet    Sig: Take 1 tablet (25 mg total) by mouth daily.    Dispense:  90 tablet    Refill:  3   isosorbide mononitrate (IMDUR) 30 MG 24 hr tablet    Sig: Take 1 tablet (30 mg total) by mouth daily.    Dispense:  90 tablet    Refill:  1   clopidogrel (PLAVIX) 75 MG tablet    Sig: Take 1  tablet (75 mg total) by mouth daily.    Dispense:  90 tablet    Refill:  1   aspirin EC 81 MG tablet    Sig: Take 1 tablet (81 mg total) by mouth daily. Swallow whole.    Dispense:  90 tablet    Refill:  3   amLODipine (NORVASC) 10 MG tablet    Sig: Take 1 tablet (10 mg total) by mouth daily.    Dispense:  180 tablet    Refill:  3   furosemide (LASIX) 20 MG tablet    Sig: Take 1 tablet (20 mg total) by mouth daily.    Dispense:  90 tablet    Refill:  3   potassium chloride SA (KLOR-CON) 10 MEQ tablet    Sig: Take 1 tablet (10 mEq total) by mouth daily.    Dispense:  90 tablet    Refill:  3     Patient Instructions  Medication Instructions:  The current medical regimen is effective;  continue present plan and medications. Start Amlodipine 10 mg daily  Start Lasix 20 mg daily  Start Potassium 10 meq daily with Lasix   *If you need a refill on your cardiac medications before your next appointment,  please call your pharmacy*   Lab Work: LIPID (1 week before follow up in 3 months)   If you have labs (blood work) drawn today and your tests are completely normal, you will receive your results only by: MyChart Message (if you have MyChart) OR A paper copy in the mail If you have any lab test that is abnormal or we need to change your treatment, we will call you to review the results.   Follow-Up: At [facility], you and your health needs are our priority.  As part of our continuing mission to provide you with exceptional heart care, we have created designated Provider Care Teams.  These Care Teams include your primary Cardiologist (physician) and Advanced Practice Providers (APPs -  Physician Assistants and Nurse Practitioners) who all work together to provide you with the care you need, when you need it.  We recommend signing up for the patient portal called "MyChart".  Sign up information is provided on this After Visit Summary.  MyChart is used to connect with patients for  Virtual Visits (Telemedicine).  Patients are able to view lab/test results, encounter notes, upcoming appointments, etc.  Non-urgent messages can be sent to your provider as well.   To learn more about what you can do with MyChart, go to [email or web address].    Your next appointment:   3 month(s)  The format for your next appointment:   In Person  Provider:   Lennie OdorWesley O'Neal, MD   Other Instructions Check BP daily  Referral to Cardiac Rehab- they will call you for an appointment.    Time Spent with Patient: I have spent a total of 35 minutes with patient reviewing hospital notes, telemetry, EKGs, labs and examining the patient as well as establishing an assessment and plan that was discussed with the patient.  > 50% of time was spent in direct patient care.  Signed, Lenna GilfordWesley T. Flora Lipps'Neal, MD, [facility]  [facility]  [address], [address] [place], [place] [address] [phone]  12/24/2020 11:02 AM

## 2020-12-24 ENCOUNTER — Ambulatory Visit (INDEPENDENT_AMBULATORY_CARE_PROVIDER_SITE_OTHER): Payer: BC Managed Care – PPO | Admitting: Cardiovascular Disease

## 2020-12-24 ENCOUNTER — Encounter: Payer: Self-pay | Admitting: Cardiovascular Disease

## 2020-12-24 ENCOUNTER — Other Ambulatory Visit: Payer: Self-pay

## 2020-12-24 VITALS — BP 176/76 | HR 55 | Ht 66.0 in | Wt 282.4 lb

## 2020-12-24 DIAGNOSIS — E782 Mixed hyperlipidemia: Secondary | ICD-10-CM

## 2020-12-24 DIAGNOSIS — I25118 Atherosclerotic heart disease of native coronary artery with other forms of angina pectoris: Secondary | ICD-10-CM | POA: Diagnosis not present

## 2020-12-24 DIAGNOSIS — I10 Essential (primary) hypertension: Secondary | ICD-10-CM

## 2020-12-24 DIAGNOSIS — I5032 Chronic diastolic (congestive) heart failure: Secondary | ICD-10-CM

## 2020-12-24 DIAGNOSIS — Z720 Tobacco use: Secondary | ICD-10-CM

## 2020-12-24 MED ORDER — LOSARTAN POTASSIUM 25 MG PO TABS: 25.0000 mg | ORAL_TABLET | Freq: Every day | ORAL | 3 refills | Status: DC

## 2020-12-24 MED ORDER — ISOSORBIDE MONONITRATE ER 30 MG PO TB24: 30.0000 mg | ORAL_TABLET | Freq: Every day | ORAL | 1 refills | Status: DC

## 2020-12-24 MED ORDER — CLOPIDOGREL BISULFATE 75 MG PO TABS: 75.0000 mg | ORAL_TABLET | Freq: Every day | ORAL | 1 refills | Status: DC

## 2020-12-24 MED ORDER — POTASSIUM CHLORIDE CRYS ER 10 MEQ PO TBCR: 10.0000 meq | EXTENDED_RELEASE_TABLET | Freq: Every day | ORAL | 3 refills | Status: DC

## 2020-12-24 MED ORDER — ASPIRIN EC 81 MG PO TBEC: 81.0000 mg | DELAYED_RELEASE_TABLET | Freq: Every day | ORAL | 3 refills | Status: AC

## 2020-12-24 MED ORDER — NITROGLYCERIN 0.4 MG SL SUBL: 0.4000 mg | SUBLINGUAL_TABLET | SUBLINGUAL | 2 refills | Status: DC | PRN

## 2020-12-24 MED ORDER — FUROSEMIDE 20 MG PO TABS: 20.0000 mg | ORAL_TABLET | Freq: Every day | ORAL | 3 refills | Status: DC

## 2020-12-24 MED ORDER — AMLODIPINE BESYLATE 10 MG PO TABS: 10.0000 mg | ORAL_TABLET | Freq: Every day | ORAL | 3 refills | Status: DC

## 2020-12-24 NOTE — Patient Instructions (Addendum)
Medication Instructions:  The current medical regimen is effective;  continue present plan and medications. Start Amlodipine 10 mg daily  Start Lasix 20 mg daily  Start Potassium 10 meq daily with Lasix   *If you need a refill on your cardiac medications before your next appointment, please call your pharmacy*   Lab Work: LIPID (1 week before follow up in 3 months)   If you have labs (blood work) drawn today and your tests are completely normal, you will receive your results only by: MyChart Message (if you have MyChart) OR A paper copy in the mail If you have any lab test that is abnormal or we need to change your treatment, we will call you to review the results.   Follow-Up: At [facility], you and your health needs are our priority.  As part of our continuing mission to provide you with exceptional heart care, we have created designated Provider Care Teams.  These Care Teams include your primary Cardiologist (physician) and Advanced Practice Providers (APPs -  Physician Assistants and Nurse Practitioners) who all work together to provide you with the care you need, when you need it.  We recommend signing up for the patient portal called "MyChart".  Sign up information is provided on this After Visit Summary.  MyChart is used to connect with patients for Virtual Visits (Telemedicine).  Patients are able to view lab/test results, encounter notes, upcoming appointments, etc.  Non-urgent messages can be sent to your provider as well.   To learn more about what you can do with MyChart, go to [email or web address].    Your next appointment:   3 month(s)  The format for your next appointment:   In Person  Provider:   Lennie Odor, MD   Other Instructions Check BP daily  Referral to Cardiac Rehab- they will call you for an appointment.

## 2020-12-26 ENCOUNTER — Other Ambulatory Visit (HOSPITAL_COMMUNITY): Payer: Self-pay

## 2020-12-26 ENCOUNTER — Other Ambulatory Visit (HOSPITAL_BASED_OUTPATIENT_CLINIC_OR_DEPARTMENT_OTHER): Payer: Self-pay

## 2020-12-26 ENCOUNTER — Telehealth (HOSPITAL_COMMUNITY): Payer: Self-pay | Admitting: Pharmacist

## 2020-12-26 NOTE — Telephone Encounter (Signed)
Pharmacy Transitions of Care Follow-up Telephone Call  Date of discharge: 12/12/2020  Discharge Diagnosis: Unstable angina  How have you been since you were released from the hospital? Doing well overall, recovering well. No significant signs or symptoms of bleeding. Has some lingering bruising from hospitalization. Reminded patient to stop omeprazole, as this has a high DDI with clopidogrel.   Medication changes made at discharge:  - START: clopidogrel, pantoprazole, isosorbide  - STOPPED: omeprazole  Medication changes verified by the patient? Yes    Medication Accessibility:  Home Pharmacy: [facility] [address]   Was the patient provided with refills on discharged medications? Yes   Have all prescriptions been transferred from [facility] to home pharmacy? Completed now   Is the patient able to afford medications? Yes - commercially insured     Medication Review: CLOPIDOGREL (PLAVIX) Clopidogrel 75 mg once daily.  - Reviewed potential DDIs with patient  - Advised patient of medications to avoid (NSAIDs, ASA)  - Educated that Tylenol (acetaminophen) will be the preferred analgesic to prevent risk of bleeding  - Emphasized importance of monitoring for signs and symptoms of bleeding (abnormal bruising, prolonged bleeding, nose bleeds, bleeding from gums, discolored urine, black tarry stools)  - Advised patient to alert all providers of anticoagulation therapy prior to starting a new medication or having a procedure    Follow-up Appointments:  Pulmonology follow up appointment on 02/03/2021  Cardiology follow up appointment on 03/25/2021  If their condition worsens, is the pt aware to call PCP or go to the Emergency Dept.? Yes  Theodis Sato, PharmD Clinical Pharmacist [facility]  12/26/2020 11:51 AM

## 2020-12-27 ENCOUNTER — Telehealth (HOSPITAL_COMMUNITY): Payer: Self-pay

## 2020-12-27 NOTE — Telephone Encounter (Signed)
Pt insurance is active and benefits verified through [employer] Co-pay 0, DED $2,500/$2,500 met, out of pocket $6,000/$6,000 met, co-insurance 30%. no pre-authorization required. Passport, 12/27/2020_0 :30am, REF# [id number]   Will contact patient to see if she is interested in the Cardiac Rehab Program. If interested, patient will need to complete follow up appt. Once completed, patient will be contacted for scheduling upon review by the RN Navigator.

## 2020-12-27 NOTE — Telephone Encounter (Signed)
Received office referral for cardiac rehab from Dr. Flora Lipps filled out white card and printed pt referral and pass to the nurse navigator for review.

## 2020-12-27 NOTE — Telephone Encounter (Signed)
Called patient to see if she is interested in the Cardiac Rehab Program. Patient expressed interest. Explained scheduling process and went over insurance, patient verbalized understanding. Will contact patient for scheduling once f/u has been completed. 

## 2020-12-30 ENCOUNTER — Encounter (HOSPITAL_COMMUNITY): Payer: Self-pay | Admitting: *Deleted

## 2020-12-30 NOTE — Progress Notes (Signed)
Clinical review of pt follow up appt on 9/6 with Dr. Carmon Ginsberg - cardiologist office note. Pt  with complaints of daily chest pain.Pt cath showed 2 vessel disease - suspect small vessel disease.  Additional medications prescribed for optimal angina management.  This is key for moving forward with scheduling CR.  Unstable angina is a contraindication for CR. Pt appropriate for scheduling for on site cardiac rehab and/or enrollment in Virtual Cardiac Rehab if she reports that her chest pain is well managed to rare occasions.   Pt Covid Risk Score is 5.  Will forward to staff for follow up. Alanson Aly, BSN Cardiac and Emergency planning/management officer

## 2021-01-03 ENCOUNTER — Telehealth: Payer: Self-pay | Admitting: Cardiovascular Disease

## 2021-01-03 NOTE — Telephone Encounter (Signed)
  Are you calling in reference to your FMLA or disability form? FMLA  What is your question in regards to FMLA or disability form? FMLA   Do you need copies of your medical records? No   Are you waiting on a nurse to call you back with results or are you wanting copies of your results? Need varication  Please call [phone] ext [phone] [address]    Please route to Medical Records or your medical records site representative

## 2021-01-03 NOTE — Telephone Encounter (Signed)
Do you have forms for this patient? I don't have any forms for her, she said she was given a note at her last appointment with Dr.O'neal.

## 2021-01-06 NOTE — Telephone Encounter (Signed)
Okay, I spoke with her on Friday she did say she got a letter. Could you call the company and see what verification they need? Please and Thank you!

## 2021-01-08 NOTE — Telephone Encounter (Signed)
I called and was on hold they hung up on me 2 times. I'm sorry I got so busy forgot to out note in.

## 2021-01-13 ENCOUNTER — Telehealth: Payer: Self-pay | Admitting: Cardiovascular Disease

## 2021-01-13 NOTE — Telephone Encounter (Signed)
   Pt said, she is having breathing issue and coughs a lot, she said its probably allergies. She wanted to know what meds she can take for it

## 2021-01-13 NOTE — Telephone Encounter (Signed)
Returned call to patient who states she started coughing on 9/23, cough has worsened over past 3 days and she also has nasal congestion. Denies headache, sore throat.  Quit smoking July 22. Has not taken a Covid-19 test but has some at home and is going to take one. 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 Advised that she can take Zyrtec which is her preferred allergy medicine and plain mucinex; may take Tylenol for headache if needed. Advised her to avoid products with decongestant and to follow-up with PCP if she is not feeling better in a few days. She verbalized understanding and agreement and thanked me for the call.

## 2021-01-14 ENCOUNTER — Emergency Department (HOSPITAL_BASED_OUTPATIENT_CLINIC_OR_DEPARTMENT_OTHER): Payer: Worker's Compensation | Admitting: Radiology

## 2021-01-14 ENCOUNTER — Other Ambulatory Visit: Payer: Self-pay

## 2021-01-14 ENCOUNTER — Emergency Department (HOSPITAL_BASED_OUTPATIENT_CLINIC_OR_DEPARTMENT_OTHER)
Admission: EM | Admit: 2021-01-14 | Discharge: 2021-01-14 | Disposition: A | Discharge status: Home / Self Care | Payer: Worker's Compensation | Attending: Emergency Medicine | Admitting: Emergency Medicine

## 2021-01-14 ENCOUNTER — Encounter (HOSPITAL_BASED_OUTPATIENT_CLINIC_OR_DEPARTMENT_OTHER): Payer: Self-pay | Admitting: *Deleted

## 2021-01-14 DIAGNOSIS — I10 Essential (primary) hypertension: Secondary | ICD-10-CM | POA: Diagnosis not present

## 2021-01-14 DIAGNOSIS — Z87891 Personal history of nicotine dependence: Secondary | ICD-10-CM | POA: Diagnosis not present

## 2021-01-14 DIAGNOSIS — W010XXA Fall on same level from slipping, tripping and stumbling without subsequent striking against object, initial encounter: Secondary | ICD-10-CM | POA: Diagnosis not present

## 2021-01-14 DIAGNOSIS — Y990 Civilian activity done for income or pay: Secondary | ICD-10-CM | POA: Diagnosis not present

## 2021-01-14 DIAGNOSIS — Z7982 Long term (current) use of aspirin: Secondary | ICD-10-CM | POA: Diagnosis not present

## 2021-01-14 DIAGNOSIS — W19XXXA Unspecified fall, initial encounter: Secondary | ICD-10-CM

## 2021-01-14 DIAGNOSIS — E039 Hypothyroidism, unspecified: Secondary | ICD-10-CM | POA: Diagnosis not present

## 2021-01-14 DIAGNOSIS — Z7951 Long term (current) use of inhaled steroids: Secondary | ICD-10-CM | POA: Diagnosis not present

## 2021-01-14 DIAGNOSIS — S8391XA Sprain of unspecified site of right knee, initial encounter: Secondary | ICD-10-CM | POA: Insufficient documentation

## 2021-01-14 DIAGNOSIS — S8991XA Unspecified injury of right lower leg, initial encounter: Secondary | ICD-10-CM | POA: Diagnosis present

## 2021-01-14 DIAGNOSIS — S6992XA Unspecified injury of left wrist, hand and finger(s), initial encounter: Secondary | ICD-10-CM | POA: Diagnosis not present

## 2021-01-14 DIAGNOSIS — J45909 Unspecified asthma, uncomplicated: Secondary | ICD-10-CM | POA: Diagnosis not present

## 2021-01-14 DIAGNOSIS — Z79899 Other long term (current) drug therapy: Secondary | ICD-10-CM | POA: Insufficient documentation

## 2021-01-14 DIAGNOSIS — J441 Chronic obstructive pulmonary disease with (acute) exacerbation: Secondary | ICD-10-CM | POA: Diagnosis not present

## 2021-01-14 MED ORDER — ACETAMINOPHEN 325 MG PO TABS: 650.0000 mg | ORAL_TABLET | Freq: Four times a day (QID) | ORAL | 0 refills | Status: DC | PRN

## 2021-01-14 MED ORDER — ACETAMINOPHEN 325 MG PO TABS
650.0000 mg | ORAL_TABLET | Freq: Once | ORAL | Status: AC
  Administered 2021-01-14: 650 mg via ORAL
  Filled 2021-01-14: qty 2

## 2021-01-14 MED ORDER — METHOCARBAMOL 500 MG PO TABS
1000.0000 mg | ORAL_TABLET | Freq: Once | ORAL | Status: AC
  Administered 2021-01-14: 1000 mg via ORAL
  Filled 2021-01-14: qty 2

## 2021-01-14 MED ORDER — LIDOCAINE 5 % EX PTCH: 1.0000 | MEDICATED_PATCH | Freq: Every day | CUTANEOUS | 0 refills | Status: AC | PRN

## 2021-01-14 MED ORDER — METHOCARBAMOL 500 MG PO TABS: 1000.0000 mg | ORAL_TABLET | Freq: Two times a day (BID) | ORAL | 0 refills | Status: AC

## 2021-01-14 NOTE — Discharge Instructions (Addendum)
Please see your PCP in one week for repeat XR of the left wrist (pain at snuff box)

## 2021-01-14 NOTE — Progress Notes (Signed)
@Patient  ID: , female    DOB: Apr 05, 1961, 60 y.o.   MRN: [id number]  Chief Complaint  Patient presents with   Consult    Referred by PCP for asthma. States she was diagnosed with asthma as a child. Received allergy shots twice a week for many years.     Referring provider: [id number], MD  HPI:   60 y.o. woman whom we are seeing in consultation for evaluation of asthma.  PCP note reviewed.Note from referring provider reviewed.  Patient reports longstanding history of asthma.  Was on allergy shots twice a week for many years.  Last received over a year ago.  He feels her breathing has not been well controlled.  More short of breath.  Worse dyspnea on inclines or stairs.  Usually okay at rest.  Some cough.  Worse in the evenings.  No position where things are better or worse.  No environmental or seasonal changes she can identify to make things better or worse.  Not really using any inhalers lately.  No other alleviating or exacerbating factors.  Today, she notes left leg is swollen.  She notes that both legs stay swollen.  Usually get better overnight, worsened during the day.  Left leg is more swollen than the right which she is concerned about.  Reviewed most recent chest x-ray 08/2020 [facility] interpretation reveals clear lungs bilaterally.  PMH: Asthma, GERD, seasonal allergies Family history: No significant Restoril severity relatives Surgical history: Back surgery, carpal tunnel surgery, cholecystectomy, tonsillectomy Social history: Current smoker, approximately 40+ pack years, lives in [place] / Pulmonary Flowsheets:   ACT:  No flowsheet data found.  MMRC: No flowsheet data found.  Epworth:  No flowsheet data found.  Tests:   FENO:  No results found for: NITRICOXIDE  PFT: No flowsheet data found.  WALK:  No flowsheet data found.  Imaging: Personally reviewed as per EMR discussion this note  Lab Results: Personally  reviewed CBC    Component Value Date/Time   WBC 5.5 12/12/2020 1054   RBC 3.09 (L) 12/12/2020 1054   HGB 9.4 (L) 12/12/2020 1054   HGB 10.2 (L) 12/03/2020 0929   HCT 30.8 (L) 12/12/2020 1054   HCT 32.3 (L) 12/03/2020 0929   PLT 202 12/12/2020 1054   PLT 244 12/03/2020 0929   MCV 99.7 12/12/2020 1054   MCV 94 12/03/2020 0929   MCH 30.4 12/12/2020 1054   MCHC 30.5 12/12/2020 1054   RDW 15.0 12/12/2020 1054   RDW 13.8 12/03/2020 0929   LYMPHSABS 1.2 06/24/2020 1059   MONOABS 0.4 01/16/2019 0450   EOSABS 0.4 06/24/2020 1059   BASOSABS 0.1 06/24/2020 1059    BMET    Component Value Date/Time   NA 142 12/12/2020 1054   NA 142 12/03/2020 0929   K 4.5 12/12/2020 1054   CL 109 12/12/2020 1054   CO2 29 12/12/2020 1054   GLUCOSE 111 (H) 12/12/2020 1054   BUN 16 12/12/2020 1054   BUN 19 12/03/2020 0929   CREATININE 0.90 12/12/2020 1054   CALCIUM 9.3 12/12/2020 1054   GFRNONAA >60 12/12/2020 1054   GFRAA >60 01/16/2019 0450    BNP No results found for: BNP  ProBNP No results found for: PROBNP  Specialty Problems       Pulmonary Problems   Allergic rhinitis    Qualifier: Diagnosis of  By: 01/18/2019 FNP, Daphine Deutscher        Respiratory tract congestion with cough   Asthma with COPD  with exacerbation (HCC)    Allergies  Allergen Reactions   Aspirin Other (See Comments)    Face redness/broken blood vessels red Per patient doctor told her to try 12/05/20   Avocado Other (See Comments)    Was told that she was allergic but has never tried them.    Codeine Itching and Swelling   Other Other (See Comments)    Environmental and cats   Strawberry Extract Itching and Rash   Tomato Itching and Rash    Immunization History  Administered Date(s) Administered   Influenza Split 01/04/2012   Influenza Whole 04/10/2008   Influenza,inj,Quad PF,6+ Mos 06/24/2020   PFIZER Comirnaty(Gray Top)Covid-19 Tri-Sucrose Vaccine 08/10/2019, 09/04/2019, 06/24/2020   Td 04/10/2008   Tdap  01/04/2012    Past Medical History:  Diagnosis Date   Asthma    Depression    Hyperlipidemia    Migraines    Pneumonia    Thyroid disease     Tobacco History: Social History   Tobacco Use  Smoking Status Every Day   Packs/day: 1.00   Years: 45.00   Pack years: 45.00   Types: Cigarettes  Smokeless Tobacco Never   Ready to quit: Not Answered Counseling given: Not Answered   Continue to not smoke  Outpatient Encounter Medications as of 11/05/2020  Medication Sig   budesonide-formoterol (SYMBICORT) 160-4.5 MCG/ACT inhaler Inhale 2 puffs into the lungs 2 (two) times daily.   fexofenadine (ALLEGRA ALLERGY) 180 MG tablet Take 1 tablet (180 mg total) by mouth daily. (Patient not taking: Reported on 12/24/2020)   levothyroxine (SYNTHROID) 100 MCG tablet Take 1 tablet (100 mcg total) by mouth every morning. 30 minutes before food   montelukast (SINGULAIR) 10 MG tablet Take 1 tablet (10 mg total) by mouth at bedtime.   [DISCONTINUED] acetaminophen (TYLENOL) 500 MG tablet Take 2,000 mg by mouth every 6 (six) hours as needed for moderate pain.   [DISCONTINUED] amLODipine (NORVASC) 5 MG tablet TAKE 1 TABLET BY MOUTH AT BEDTIME   [DISCONTINUED] celecoxib (CELEBREX) 100 MG capsule Take 1 capsule (100 mg total) by mouth 2 (two) times daily.   [DISCONTINUED] citalopram (CELEXA) 20 MG tablet Take 1 tablet (20 mg total) by mouth daily.   [DISCONTINUED] HYDROcodone-acetaminophen (NORCO) 5-325 MG tablet Take 1 tablet by mouth 3 (three) times daily as needed. (Patient not taking: Reported on 12/03/2020)   [DISCONTINUED] Menthol-Methyl Salicylate (BEN GAY GREASELESS) 10-15 % greaseless cream Apply 1 application topically 3 (three) times daily as needed for pain.   [DISCONTINUED] ondansetron (ZOFRAN) 4 MG tablet Take 1 tablet (4 mg total) by mouth every 8 (eight) hours as needed for nausea or vomiting. (Patient not taking: Reported on 12/03/2020)   [DISCONTINUED] amLODipine (NORVASC) 5 MG tablet Take 1  tablet (5 mg total) by mouth at bedtime.   No facility-administered encounter medications on file as of 11/05/2020.     Review of Systems  Review of Systems  No chest pain with exertion.  No orthopnea or PND.  Conference review of systems otherwise negative. Physical Exam  BP 132/88   Pulse (!) 59   Ht 5\' 7"  (1.702 m)   Wt 286 lb (129.7 kg)   SpO2 95%   BMI 44.79 kg/m   Wt Readings from Last 5 Encounters:  12/24/20 282 lb 6.4 oz (128.1 kg)  12/11/20 280 lb (127 kg)  12/03/20 289 lb 14.4 oz (131.5 kg)  11/19/20 285 lb 12.8 oz (129.6 kg)  11/05/20 286 lb (129.7 kg)    BMI Readings  from Last 5 Encounters:  12/24/20 45.58 kg/m  12/11/20 45.19 kg/m  12/03/20 46.79 kg/m  11/19/20 46.13 kg/m  11/05/20 44.79 kg/m     Physical Exam General: In chair, no acute distress Eyes: EOMI, icterus Neck: Supple, no JVP appreciated, habitus precludes accurate evaluation Pulmonary: Clear, distant, normal work of breathing Cardiovascular: Regular in rhythm, no murmur Abdomen: Nondistended, bowel sounds present MSK: No synovitis, joint effusion Extremities: Bilateral swelling of legs, left appears grossly larger than the right Neuro: Normal gait, no weakness Psych: Normal mood, full affect  Assessment & Plan:   Dyspnea on exertion: Concern for poorly controlled asthma.  In addition, she is a smoker so contribution of smoking-related disease as well.  High concern for obesity as well as deconditioning given relative lack of activity.  Asthma: Poorly controlled.  Stress importance of maintenance inhaler.  Prescribed Symbicort 2 puff twice daily.  Rinse mouth after use.  In addition, start Singulair once nightly.  Reassess response.  Consider PFTs, phenotyping in future if not improving symptoms.  Leg swelling.  Left.  The right.  Suspect venous insufficiency.  Left does appear bigger than the right.  Lower extremity Dopplers ordered to evaluate for DVT.  Return in about 3 months  (around 02/05/2021).   Karren Burly, MD

## 2021-01-14 NOTE — ED Triage Notes (Signed)
Tripped over her co-worker's foot and fell.  Pain to left knee and left thumb.

## 2021-01-14 NOTE — ED Provider Notes (Signed)
[facility] EMERGENCY DEPT Provider Note   CSN: [id number] Arrival date & time: 01/14/21  1351     History Chief Complaint  Patient presents with   Marletta Lor    Bethany Beard is a 60 y.o. female.  This is a 60 y.o. female with significant medical history as below, including migraines, HLD, asthma who presents to the ED with complaint of pain after fall. Pt was at work, she was reaching to get a coffee pot and ran into co-worker, fell on top of coworker and twisted her right knee. Pain to right knee, left forearm/thumb. Mild pain to right elbow. Able to ambulate after the fall with assistance. Adamant she did not strike her head or neck, no LOC, no incontinence, no numbness or tingling, no n/v, no change to bowel/bladder fxn. Normal state of health prior to this event. Pain to knee described as sharp/stabbing, to lateral aspect of right knee, non-radiating pain. Worse with weight bearing or palpation.      The history is provided by the patient.  Fall This is a new problem. The current episode started 1 to 2 hours ago. The problem has not changed since onset.Pertinent negatives include no chest pain, no abdominal pain, no headaches and no shortness of breath. Nothing aggravates the symptoms. Nothing relieves the symptoms.      Past Medical History:  Diagnosis Date   Asthma    Depression    Hyperlipidemia    Migraines    Pneumonia    Thyroid disease     Patient Active Problem List   Diagnosis Date Noted   Unstable angina (HCC)    Chest tightness 09/30/2020   Asthma with COPD with exacerbation (HCC) 09/17/2020   Respiratory tract congestion with cough 09/06/2020   Hypertension 08/06/2020   Knee pain 08/06/2020   Ganglion cyst of volar aspect of left wrist 06/24/2020   Other abnormal glucose 03/09/2012   Eczema, dyshidrotic 01/04/2012   ANA POSITIVE 09/05/2008   HYPERCHOLESTEROLEMIA 04/16/2008   MIGRAINE HEADACHE 01/31/2008   GERD 01/31/2008   Hypothyroidism  01/11/2008   OBESITY 01/11/2008   TOBACCO ABUSE 01/11/2008   Allergic rhinitis 01/11/2008   ASTHMA 01/11/2008   LOW BACK PAIN, CHRONIC 01/11/2008   MDD (major depressive disorder) 04/20/2005    Past Surgical History:  Procedure Laterality Date   BACK SURGERY     carpel     CHOLECYSTECTOMY     cyst remove Left    LEFT HEART CATH AND CORONARY ANGIOGRAPHY N/A 12/11/2020   Procedure: LEFT HEART CATH AND CORONARY ANGIOGRAPHY;  Surgeon: Kathleene Hazel, MD;  Location: [facility] INVASIVE CV LAB;  Service: Cardiovascular;  Laterality: N/A;   TONSILLECTOMY       OB History     Gravida  1   Para  1   Term      Preterm      AB      Living         SAB      IAB      Ectopic      Multiple      Live Births              Family History  Adopted: Yes  Problem Relation Age of Onset   Cancer Neg Hx    Diabetes Neg Hx    Hyperlipidemia Neg Hx    Hypertension Neg Hx    Kidney disease Neg Hx    Stroke Neg Hx     Social History  Tobacco Use   Smoking status: Former    Packs/day: 1.00    Years: 45.00    Pack years: 45.00    Types: Cigarettes    Quit date: 10/2020    Years since quitting: 0.2   Smokeless tobacco: Never  Vaping Use   Vaping Use: Never used  Substance Use Topics   Alcohol use: No    Comment: somtimes   Drug use: No    Home Medications Prior to Admission medications   Medication Sig Start Date End Date Taking? Authorizing Provider  acetaminophen (TYLENOL) 325 MG tablet Take 2 tablets (650 mg total) by mouth every 6 (six) hours as needed. 01/14/21  Yes Tanda Rockers A, DO  albuterol (VENTOLIN HFA) 108 (90 Base) MCG/ACT inhaler Inhale 2 puffs into the lungs every 6 (six) hours as needed for wheezing or shortness of breath (Asthma).   Yes [provider]  amLODipine (NORVASC) 10 MG tablet Take 1 tablet (10 mg total) by mouth daily. 12/24/20 03/24/21 Yes O'Neal, Ronnald Ramp, MD  aspirin EC 81 MG tablet Take 1 tablet (81 mg total) by mouth  daily. Swallow whole. 12/24/20  Yes O'Neal, Ronnald Ramp, MD  atorvastatin (LIPITOR) 40 MG tablet Take 1 tablet (40 mg total) by mouth daily. 12/03/20 03/03/21 Yes O'Neal, Ronnald Ramp, MD  budesonide-formoterol [facility]) 160-4.5 MCG/ACT inhaler Inhale 2 puffs into the lungs 2 (two) times daily. 11/05/20  Yes Hunsucker, Lesia Sago, MD  citalopram (CELEXA) 20 MG tablet Take 1 tablet by mouth once daily 12/05/20  Yes Maness, Loistine Chance, MD  clopidogrel (PLAVIX) 75 MG tablet Take 1 tablet (75 mg total) by mouth daily. 12/24/20  Yes O'Neal, Ronnald Ramp, MD  furosemide (LASIX) 20 MG tablet Take 1 tablet (20 mg total) by mouth daily. 12/24/20 03/24/21 Yes O'Neal, Ronnald Ramp, MD  isosorbide mononitrate (IMDUR) 30 MG 24 hr tablet Take 1 tablet (30 mg total) by mouth daily. 12/24/20  Yes O'Neal, Ronnald Ramp, MD  levothyroxine (SYNTHROID) 100 MCG tablet Take 1 tablet (100 mcg total) by mouth every morning. 30 minutes before food 10/09/20  Yes Maness, Loistine Chance, MD  lidocaine (LIDODERM) 5 % Place 1 patch onto the skin daily as needed for up to 15 days. Remove & Discard patch within 12 hours or as directed by MD 01/14/21 01/29/21 Yes Tanda Rockers A, DO  losartan (COZAAR) 25 MG tablet Take 1 tablet (25 mg total) by mouth daily. 12/24/20 03/24/21 Yes O'Neal, Ronnald Ramp, MD  methocarbamol (ROBAXIN) 500 MG tablet Take 2 tablets (1,000 mg total) by mouth 2 (two) times daily for 5 days. 01/14/21 01/19/21 Yes Tanda Rockers A, DO  montelukast (SINGULAIR) 10 MG tablet Take 1 tablet (10 mg total) by mouth at bedtime. 11/05/20  Yes Hunsucker, Lesia Sago, MD  pantoprazole (PROTONIX) 40 MG tablet Take 1 tablet (40 mg total) by mouth daily. 12/13/20  Yes Laverda Page B, NP  potassium chloride SA (KLOR-CON) 10 MEQ tablet Take 1 tablet (10 mEq total) by mouth daily. 12/24/20 03/24/21 Yes O'Neal, Ronnald Ramp, MD  acetaminophen (TYLENOL) 650 MG CR tablet Take 1,950 mg by mouth every 8 (eight) hours as needed for pain.    [provider]   nitroGLYCERIN (NITROSTAT) 0.4 MG SL tablet Place 1 tablet (0.4 mg total) under the tongue every 5 (five) minutes as needed for chest pain. 12/24/20 03/24/21  O'NealRonnald Ramp, MD    Allergies    Aspirin, Avocado, Codeine, Other, Strawberry extract, and Tomato  Review of Systems   Review of  Systems  Constitutional:  Negative for chills and fever.  HENT:  Negative for facial swelling and trouble swallowing.   Eyes:  Negative for photophobia and visual disturbance.  Respiratory:  Negative for cough and shortness of breath.   Cardiovascular:  Negative for chest pain and palpitations.  Gastrointestinal:  Negative for abdominal pain, nausea and vomiting.  Endocrine: Negative for polydipsia and polyuria.  Genitourinary:  Negative for difficulty urinating and hematuria.  Musculoskeletal:  Positive for arthralgias. Negative for gait problem and joint swelling.  Skin:  Negative for pallor and rash.  Neurological:  Negative for syncope and headaches.  Psychiatric/Behavioral:  Negative for agitation and confusion.    Physical Exam Updated Vital Signs BP (!) 141/71 (BP Location: Left Wrist)   Pulse (!) 54   Temp 98.5 F (36.9 C)   Resp 18   Ht 5\' 7"  (1.702 m)   Wt 127 kg   SpO2 94%   BMI 43.85 kg/m   Physical Exam Vitals and nursing note reviewed.  Constitutional:      General: She is not in acute distress.    Appearance: Normal appearance.  HENT:     Head: Normocephalic and atraumatic. No raccoon eyes, Battle's sign, right periorbital erythema or left periorbital erythema.     Right Ear: External ear normal.     Left Ear: External ear normal.     Nose: Nose normal.     Mouth/Throat:     Mouth: Mucous membranes are moist.  Eyes:     General: No scleral icterus.       Right eye: No discharge.        Left eye: No discharge.     Extraocular Movements: Extraocular movements intact.     Pupils: Pupils are equal, round, and reactive to light.  Cardiovascular:     Rate and  Rhythm: Normal rate and regular rhythm.     Pulses: Normal pulses.     Heart sounds: Normal heart sounds.  Pulmonary:     Effort: Pulmonary effort is normal. No respiratory distress.     Breath sounds: Normal breath sounds.  Abdominal:     General: Abdomen is flat.     Tenderness: There is no abdominal tenderness.  Musculoskeletal:        General: Normal range of motion.       Arms:     Cervical back: Normal range of motion.     Right lower leg: No edema.     Left lower leg: No edema.       Legs:     Comments: 2+ radial and DP pulses. Neurovascularly intact to bilateral upper and lower extremities. No hip pain.   Skin:    General: Skin is warm and dry.     Capillary Refill: Capillary refill takes less than 2 seconds.  Neurological:     Mental Status: She is alert and oriented to person, place, and time.     GCS: GCS eye subscore is 4. GCS verbal subscore is 5. GCS motor subscore is 6.     Cranial Nerves: Cranial nerves are intact. No facial asymmetry.     Sensory: Sensation is intact.     Motor: Motor function is intact. No tremor.     Coordination: Coordination is intact.  Psychiatric:        Mood and Affect: Mood normal.        Behavior: Behavior normal.    ED Results / Procedures / Treatments   Labs (all labs ordered  are listed, but only abnormal results are displayed) Labs Reviewed - No data to display  EKG None  Radiology DG Forearm Left  Result Date: 01/14/2021 CLINICAL DATA:  Forearm pain after fall EXAM: LEFT FOREARM - 2 VIEW COMPARISON:  None. FINDINGS: There is no evidence of fracture or other focal bone lesions. Soft tissues are unremarkable. IMPRESSION: Negative. Electronically Signed   By: Jasmine Pang M.D.   On: 01/14/2021 16:12   DG Knee Complete 4 Views Right  Result Date: 01/14/2021 CLINICAL DATA:  60 year old female with history of trauma from a fall today at work. Right knee pain. EXAM: RIGHT KNEE - COMPLETE 4+ VIEW COMPARISON:  No priors.  FINDINGS: Four views of the right knee demonstrate no acute displaced fracture, subluxation or dislocation. There is some joint space narrowing, subchondral sclerosis and osteophyte formation in a tricompartmental distribution, most severe in the medial and patellofemoral compartments, compatible with osteoarthritis. Numerous vascular calcifications are also noted. IMPRESSION: 1. No acute radiographic abnormality of the right knee. 2. 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 3. Atherosclerosis. Electronically Signed   By: Trudie Reed M.D.   On: 01/14/2021 15:46   DG Finger Thumb Left  Result Date: 01/14/2021 CLINICAL DATA:  Fall, left thumb pain EXAM: LEFT THUMB 2+V COMPARISON:  Left wrist radiographs 10/16/2020 FINDINGS: There is no acute fracture or dislocation. Alignment is normal. The joint spaces are preserved. The soft tissues are unremarkable. IMPRESSION: No acute fracture or dislocation. Electronically Signed   By: Lesia Hausen M.D.   On: 01/14/2021 15:46    Procedures Procedures   Medications Ordered in ED Medications  methocarbamol (ROBAXIN) tablet 1,000 mg (1,000 mg Oral Given 01/14/21 1543)  acetaminophen (TYLENOL) tablet 650 mg (650 mg Oral Given 01/14/21 1543)    ED Course  I have reviewed the triage vital signs and the nursing notes.  Pertinent labs & imaging results that were available during my care of the patient were reviewed by me and considered in my medical decision making (see chart for details).    MDM Rules/Calculators/A&P                         This patient complains of fall, knee injury; this involves an extensive number of treatment options and is a complaint that carries with it a high risk of complications and morbidity. Serious etiologies considered. Vital signs reviewed and are stable. No external evidence of head trauma. Neuro exam is non-focal. Patient is adamant that she did not strike her head.     I  ordered medication robaxin, tylenol  I ordered imaging studies which included extremity XR's and I independently  visualized and interpreted imaging which showed osteoarthritis to right knee. Otherwise XR and non-acute. No fx  Previous records obtained and reviewed    Pt with soft tissue injuries 2/2 fall. No head injury, not on AC. She was offered knee brace she did not want this. Will try ace wrap and give her crutches. Likely knee sprain. Advised her to f/u with PCP and with ortho. Supportive care at home for knee pain.  She does have pain at anatomic snuff box of the left wrist. Negative XR today. Advised her to f/u with PCP for repeat XR on one week.   The patient improved significantly and was discharged in stable condition. Detailed discussions were had with the patient regarding current findings, and need for close f/u with PCP or on call doctor. The  patient has been instructed to return immediately if the symptoms worsen in any way for re-evaluation. Patient verbalized understanding and is in agreement with current care plan. All questions answered prior to discharge.   Final Clinical Impression(s) / ED Diagnoses Final diagnoses:  Sprain of right knee, unspecified ligament, initial encounter  Injury of left wrist, initial encounter    Rx / DC Orders ED Discharge Orders          Ordered    acetaminophen (TYLENOL) 325 MG tablet  Every 6 hours PRN        01/14/21 1602    methocarbamol (ROBAXIN) 500 MG tablet  2 times daily        01/14/21 1602    lidocaine (LIDODERM) 5 %  Daily PRN        01/14/21 1602             Sloan Leiter, DO 01/14/21 1619

## 2021-01-20 NOTE — Telephone Encounter (Signed)
Patient was called the day we discussed, I was unable to leave a VM. I have not heard anything else from the patient.

## 2021-01-20 NOTE — Telephone Encounter (Signed)
Called [facility] back- asking that if they still needed [place] to please contact us- left call back number if needed.

## 2021-01-21 DIAGNOSIS — Z1231 Encounter for screening mammogram for malignant neoplasm of breast: Secondary | ICD-10-CM | POA: Diagnosis not present

## 2021-02-03 ENCOUNTER — Ambulatory Visit (INDEPENDENT_AMBULATORY_CARE_PROVIDER_SITE_OTHER): Payer: BC Managed Care – PPO | Admitting: Pulmonary Disease

## 2021-02-03 ENCOUNTER — Encounter: Payer: Self-pay | Admitting: Pulmonary Disease

## 2021-02-03 ENCOUNTER — Other Ambulatory Visit: Payer: Self-pay

## 2021-02-03 VITALS — BP 126/70 | HR 60 | Temp 97.9°F | Ht 67.0 in | Wt 271.4 lb

## 2021-02-03 DIAGNOSIS — J4540 Moderate persistent asthma, uncomplicated: Secondary | ICD-10-CM

## 2021-02-03 DIAGNOSIS — I2510 Atherosclerotic heart disease of native coronary artery without angina pectoris: Secondary | ICD-10-CM | POA: Diagnosis not present

## 2021-02-03 MED ORDER — DULERA 200-5 MCG/ACT IN AERO: 2.0000 | INHALATION_SPRAY | Freq: Two times a day (BID) | RESPIRATORY_TRACT | 6 refills | Status: DC

## 2021-02-03 NOTE — Progress Notes (Signed)
@Patient  ID: , female    DOB: [birth year], 60 y.o.   MRN: [id number]  Chief Complaint  Patient presents with   Shortness of Breath    Referring provider: [id number], MD  HPI:   60 y.o. woman whom we are seeing in follow up of asthma and DOE. Reviewed interval hospitalization, discharge summary, LHC report with new diagnosis extensive CAD.  Returns to clinic for routine follow-up.  In the interval, had left heart catheterization.  This revealed extensive coronary artery disease throughout.  High risk PCI.  Agreed upon medical management per review of documentation.  At last visit, she was placed on Symbicort high-dose for her asthma.  She showed some improvement in terms of dyspnea with this.  She was given high-dose Dulera while in the hospital Symbicort not on formulary.  She states this helped even more with her dyspnea and shortness of breath.  More energy.  Happier.  Able to do things.  Dancing around the house.  HPI at initial visit: Patient reports longstanding history of asthma.  Was on allergy shots twice a week for many years.  Last received over a year ago.  He feels her breathing has not been well controlled.  More short of breath.  Worse dyspnea on inclines or stairs.  Usually okay at rest.  Some cough.  Worse in the evenings.  No position where things are better or worse.  No environmental or seasonal changes she can identify to make things better or worse.  Not really using any inhalers lately.  No other alleviating or exacerbating factors.  Today, she notes left leg is swollen.  She notes that both legs stay swollen.  Usually get better overnight, worsened during the day.  Left leg is more swollen than the right which she is concerned about.  Reviewed most recent chest x-ray 08/2020 [facility] interpretation reveals clear lungs bilaterally.  PMH: Asthma, GERD, seasonal allergies Family history: No significant Restoril severity relatives Surgical history: Back  surgery, carpal tunnel surgery, cholecystectomy, tonsillectomy Social history: Current smoker, approximately 40+ pack years, lives in [place] / Pulmonary Flowsheets:   ACT:  No flowsheet data found.  MMRC: No flowsheet data found.  Epworth:  No flowsheet data found.  Tests:   FENO:  No results found for: NITRICOXIDE  PFT: No flowsheet data found.  WALK:  No flowsheet data found.  Imaging: Personally reviewed as per EMR discussion this note  Lab Results: Personally reviewed CBC    Component Value Date/Time   WBC 5.5 12/12/2020 1054   RBC 3.09 (L) 12/12/2020 1054   HGB 9.4 (L) 12/12/2020 1054   HGB 10.2 (L) 12/03/2020 0929   HCT 30.8 (L) 12/12/2020 1054   HCT 32.3 (L) 12/03/2020 0929   PLT 202 12/12/2020 1054   PLT 244 12/03/2020 0929   MCV 99.7 12/12/2020 1054   MCV 94 12/03/2020 0929   MCH 30.4 12/12/2020 1054   MCHC 30.5 12/12/2020 1054   RDW 15.0 12/12/2020 1054   RDW 13.8 12/03/2020 0929   LYMPHSABS 1.2 06/24/2020 1059   MONOABS 0.4 01/16/2019 0450   EOSABS 0.4 06/24/2020 1059   BASOSABS 0.1 06/24/2020 1059    BMET    Component Value Date/Time   NA 142 12/12/2020 1054   NA 142 12/03/2020 0929   K 4.5 12/12/2020 1054   CL 109 12/12/2020 1054   CO2 29 12/12/2020 1054   GLUCOSE 111 (H) 12/12/2020 1054   BUN 16 12/12/2020 1054   BUN  19 12/03/2020 0929   CREATININE 0.90 12/12/2020 1054   CALCIUM 9.3 12/12/2020 1054   GFRNONAA >60 12/12/2020 1054   GFRAA >60 01/16/2019 0450    BNP No results found for: BNP  ProBNP No results found for: PROBNP  Specialty Problems       Pulmonary Problems   Allergic rhinitis    Qualifier: Diagnosis of  By: Daphine Deutscher FNP, [place]         Allergies  Allergen Reactions   Aspirin Other (See Comments)    Face redness/broken blood vessels red Per patient doctor told her to try 12/05/20   Avocado Other (See Comments)    Was told that she was allergic but has never tried them.     Codeine Itching and Swelling   Other Other (See Comments)    Environmental and cats   Strawberry Extract Itching and Rash   Tomato Itching and Rash    Immunization History  Administered Date(s) Administered   Influenza Split 01/04/2012   Influenza Whole 04/10/2008   Influenza,inj,Quad PF,6+ Mos 06/24/2020   PFIZER Comirnaty(Gray Top)Covid-19 Tri-Sucrose Vaccine 08/10/2019, 09/04/2019, 06/24/2020   Td 04/10/2008   Tdap 01/04/2012    Past Medical History:  Diagnosis Date   Asthma    Depression    Hyperlipidemia    Migraines    Pneumonia    Thyroid disease     Tobacco History: Social History   Tobacco Use  Smoking Status Former   Packs/day: 1.00   Years: 45.00   Pack years: 45.00   Types: Cigarettes   Quit date: 10/2020   Years since quitting: 0.2  Smokeless Tobacco Never   Counseling given: Not Answered   Continue to not smoke  Outpatient Encounter Medications as of 02/03/2021  Medication Sig   acetaminophen (TYLENOL) 325 MG tablet Take 2 tablets (650 mg total) by mouth every 6 (six) hours as needed.   acetaminophen (TYLENOL) 650 MG CR tablet Take 1,950 mg by mouth every 8 (eight) hours as needed for pain.   albuterol (VENTOLIN HFA) 108 (90 Base) MCG/ACT inhaler Inhale 2 puffs into the lungs every 6 (six) hours as needed for wheezing or shortness of breath (Asthma).   amLODipine (NORVASC) 10 MG tablet Take 1 tablet (10 mg total) by mouth daily.   aspirin EC 81 MG tablet Take 1 tablet (81 mg total) by mouth daily. Swallow whole.   atorvastatin (LIPITOR) 40 MG tablet Take 1 tablet (40 mg total) by mouth daily.   citalopram (CELEXA) 20 MG tablet Take 1 tablet by mouth once daily   clopidogrel (PLAVIX) 75 MG tablet Take 1 tablet (75 mg total) by mouth daily.   furosemide (LASIX) 20 MG tablet Take 1 tablet (20 mg total) by mouth daily.   isosorbide mononitrate (IMDUR) 30 MG 24 hr tablet Take 1 tablet (30 mg total) by mouth daily.   levothyroxine (SYNTHROID) 100 MCG  tablet Take 1 tablet (100 mcg total) by mouth every morning. 30 minutes before food   losartan (COZAAR) 25 MG tablet Take 1 tablet (25 mg total) by mouth daily.   mometasone-formoterol (DULERA) 200-5 MCG/ACT AERO Inhale 2 puffs into the lungs in the morning and at bedtime.   montelukast (SINGULAIR) 10 MG tablet Take 1 tablet (10 mg total) by mouth at bedtime.   nitroGLYCERIN (NITROSTAT) 0.4 MG SL tablet Place 1 tablet (0.4 mg total) under the tongue every 5 (five) minutes as needed for chest pain.   pantoprazole (PROTONIX) 40 MG tablet Take 1 tablet (40 mg  total) by mouth daily.   potassium chloride (KLOR-CON) 10 MEQ tablet    potassium chloride SA (KLOR-CON) 10 MEQ tablet Take 1 tablet (10 mEq total) by mouth daily.   [DISCONTINUED] budesonide-formoterol (SYMBICORT) 160-4.5 MCG/ACT inhaler Inhale 2 puffs into the lungs 2 (two) times daily.   No facility-administered encounter medications on file as of 02/03/2021.     Review of Systems  Review of Systems  N/a Physical Exam  BP 126/70   Pulse 60   Temp 97.9 F (36.6 C) (Oral)   Ht 5\' 7"  (1.702 m)   Wt 271 lb 6.4 oz (123.1 kg)   SpO2 93%   BMI 42.51 kg/m   Wt Readings from Last 5 Encounters:  02/03/21 271 lb 6.4 oz (123.1 kg)  01/14/21 280 lb (127 kg)  12/24/20 282 lb 6.4 oz (128.1 kg)  12/11/20 280 lb (127 kg)  12/03/20 289 lb 14.4 oz (131.5 kg)    BMI Readings from Last 5 Encounters:  02/03/21 42.51 kg/m  01/14/21 43.85 kg/m  12/24/20 45.58 kg/m  12/11/20 45.19 kg/m  12/03/20 46.79 kg/m     Physical Exam General: In chair, no acute distress Eyes: EOMI, icterus Pulmonary: Clear, distant, normal work of breathing Cardiovascular: Regular in rhythm, no murmur Abdomen: Nondistended, bowel sounds present MSK: No synovitis, joint effusion Neuro: Normal gait, no weakness Psych: Normal mood, full affect  Assessment & Plan:   Dyspnea on exertion: Concern for poorly controlled asthma.  In addition, she is a smoker  so contribution of smoking-related disease as well.  High concern for obesity as well as deconditioning given relative lack of activity.  Great improvement with high-dose ICS/LABA therapy.  In addition, discovery of significant CAD which is likely ongoing contributor as well.  Asthma:  Stress importance of maintenance inhaler.  Prescribed Symbicort 2 puff twice daily.  Singulair once nightly.  Improved symptoms.  Better on Dulera.  Stop Symbicort, new prescription for [facility] sent today.  Leg swelling.   Suspect venous insufficiency.   Lower extremity Dopplers ordered to evaluate for DVT - 11/2020.  Return in about 6 months (around 08/04/2021).   08/06/2021, MD

## 2021-02-03 NOTE — Patient Instructions (Addendum)
Nice to see you  Use dulera 2 puffs twice a day. If too expensive ask the pharmacist for the preferred options. We can send in a different prescription if needed.  Return to clinic in 6 months or sooner as needed

## 2021-02-07 ENCOUNTER — Telehealth: Payer: Self-pay | Admitting: Pulmonary Disease

## 2021-02-07 NOTE — Telephone Encounter (Signed)
Pharmacy called and stated that the pts insurance prefers advair or Breo over McClave.   MH please advise. Thanks

## 2021-02-11 MED ORDER — FLUTICASONE FUROATE-VILANTEROL 200-25 MCG/ACT IN AEPB: 1.0000 | INHALATION_SPRAY | Freq: Every day | RESPIRATORY_TRACT | 3 refills | Status: DC

## 2021-02-11 NOTE — Telephone Encounter (Signed)
Symptoms previously improved on Dulera thus the prescription.  I did send a new prescription for Breo high-dose 1 puff daily.  If symptoms not as well controlled, will need to discuss with insurance company prior authorization for use of Dulera given improved symptoms.

## 2021-02-13 ENCOUNTER — Telehealth (HOSPITAL_COMMUNITY): Payer: Self-pay | Admitting: *Deleted

## 2021-02-13 NOTE — Telephone Encounter (Signed)
Called Bethany Beard to confirm her CR orientation appointment. I found that she had had a fall and came to the ED 01/13/21. She had injured her knee and wrist. I was not able to see any follow up appointments since that time in Epic. When I spoke with her today, I found out that she has a cast on her left thumb and her right knee is still swelling and is tender. She states that she has a pending MRI of the thumb and she is planning to return to the orthoptist if her knee does not improve. We discussed that she would not be able to get the full benefit of the exercise program with her limitations from her injuries. We also discussed that she would not want to make her knee injury any worse since she is still having issues with that. She agrees with me that we should cancel her CR orientation and her participation for now. I have ask her to keep in contact as she progresses with her recovery with this situation and she said she would. We will place her on hold at this time.

## 2021-02-14 ENCOUNTER — Telehealth (HOSPITAL_COMMUNITY): Payer: Self-pay

## 2021-02-14 NOTE — Telephone Encounter (Signed)
Pt unable to do the cardiac rehab program. Closed referral

## 2021-02-18 ENCOUNTER — Inpatient Hospital Stay (HOSPITAL_COMMUNITY): Admission: RE | Admit: 2021-02-18 | Payer: BC Managed Care – PPO | Source: Ambulatory Visit

## 2021-02-24 ENCOUNTER — Ambulatory Visit (HOSPITAL_COMMUNITY): Payer: BC Managed Care – PPO

## 2021-02-26 ENCOUNTER — Ambulatory Visit (HOSPITAL_COMMUNITY): Payer: BC Managed Care – PPO

## 2021-02-28 ENCOUNTER — Ambulatory Visit (HOSPITAL_COMMUNITY): Payer: BC Managed Care – PPO

## 2021-03-03 ENCOUNTER — Ambulatory Visit (HOSPITAL_COMMUNITY): Payer: BC Managed Care – PPO

## 2021-03-05 ENCOUNTER — Ambulatory Visit (HOSPITAL_COMMUNITY): Payer: BC Managed Care – PPO

## 2021-03-07 ENCOUNTER — Ambulatory Visit (HOSPITAL_COMMUNITY): Payer: BC Managed Care – PPO

## 2021-03-10 ENCOUNTER — Ambulatory Visit (HOSPITAL_COMMUNITY): Payer: BC Managed Care – PPO

## 2021-03-12 ENCOUNTER — Ambulatory Visit (HOSPITAL_COMMUNITY): Payer: BC Managed Care – PPO

## 2021-03-14 ENCOUNTER — Ambulatory Visit (HOSPITAL_COMMUNITY): Payer: BC Managed Care – PPO

## 2021-03-17 ENCOUNTER — Ambulatory Visit (HOSPITAL_COMMUNITY): Payer: BC Managed Care – PPO

## 2021-03-19 ENCOUNTER — Ambulatory Visit (HOSPITAL_COMMUNITY): Payer: BC Managed Care – PPO

## 2021-03-21 ENCOUNTER — Ambulatory Visit (HOSPITAL_COMMUNITY): Payer: BC Managed Care – PPO

## 2021-03-24 ENCOUNTER — Ambulatory Visit (HOSPITAL_COMMUNITY): Payer: BC Managed Care – PPO

## 2021-03-24 NOTE — Progress Notes (Signed)
Cardiology Office Note:   Date:  03/25/2021  NAME:  Bethany Beard    MRN: [id number] DOB:  Oct 28, 1960   PCP:  Reece Leader, DO  Cardiologist:  Reatha Harps, MD  Electrophysiologist:  None   Referring MD: Jovita Kussmaul, MD   Chief Complaint  Patient presents with   Follow-up        History of Present Illness:   Bethany Beard is a 60 y.o. female with a hx of diffuse CAD, tobacco abuse, HLD, HTN, HFpEF who presents for follow-up.  She reports she is doing well.  She describes no exertional chest pain or pressure.  She does have three-vessel CAD but this is being managed medically.  She reports she does get short of breath with exertion.  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  She also works at [employer].  She was anemic on labs from August.  This needs to be rechecked.  She also needs repeat lipids.  She did have elevated LVEDP at the time of her heart cath.  She is on Lasix.  She appears euvolemic today.  We discussed adding Farxiga to help with volume removal.  She has lost 10 pound since her last visit.  She also has quit smoking which I congratulated her on.  She seems to be doing well.  I am concerned about her hemoglobin.  We need to have this rechecked.  EKG today demonstrates sinus rhythm with PVCs, they are inferiorly directed with transition V2 V3.  She reports occasional palpitations.  Problem List HTN Tobacco abuse Asthma HLD -T chol 274, HDL 68, LDL 171, TG 192 5. Obstructive CAD -99% mid RCA, 100% dRCA with L to R collaterals -95% dLCX (Small) -40% LM -50% pLAD -medical management recommended  6. HFpEF -LVEDP 36 mmHG at [facility] 12/11/2020  Past Medical History: Past Medical History:  Diagnosis Date   Asthma    Depression    Hyperlipidemia    Migraines    Pneumonia    Thyroid disease     Past Surgical History: Past Surgical History:  Procedure Laterality Date   BACK SURGERY     carpel     CHOLECYSTECTOMY     cyst remove Left    LEFT HEART CATH AND CORONARY  ANGIOGRAPHY N/A 12/11/2020   Procedure: LEFT HEART CATH AND CORONARY ANGIOGRAPHY;  Surgeon: Kathleene Hazel, MD;  Location: [facility] INV[facility];  Service: Cardiovascular;  Laterality: N/A;   TONSILLECTOMY      Current Medications: Current Meds  Medication Sig   acetaminophen (TYLENOL) 325 MG tablet Take 2 tablets (650 mg total) by mouth every 6 (six) hours as needed.   acetaminophen (TYLENOL) 650 MG CR tablet Take 1,950 mg by mouth every 8 (eight) hours as needed for pain.   amLODipine (NORVASC) 10 MG tablet Take 1 tablet (10 mg total) by mouth daily.   aspirin EC 81 MG tablet Take 1 tablet (81 mg total) by mouth daily. Swallow whole.   atorvastatin (LIPITOR) 40 MG tablet Take 1 tablet (40 mg total) by mouth daily.   citalopram (CELEXA) 20 MG tablet Take 1 tablet by mouth once daily   clopidogrel (PLAVIX) 75 MG tablet Take 1 tablet (75 mg total) by mouth daily.   dapagliflozin propanediol (FARXIGA) 10 MG TABS tablet Take 1 tablet (10 mg total) by mouth daily.   fluticasone furoate-vilanterol (BREO ELLIPTA) 200-25 MCG/ACT AEPB Inhale 1 puff into the lungs daily.   isosorbide mononitrate (IMDUR) 30 MG 24 hr tablet  Take 1 tablet (30 mg total) by mouth daily.   levothyroxine (SYNTHROID) 100 MCG tablet Take 1 tablet (100 mcg total) by mouth every morning. 30 minutes before food   mometasone-formoterol (DULERA) 200-5 MCG/ACT AERO Inhale 2 puffs into the lungs in the morning and at bedtime.   montelukast (SINGULAIR) 10 MG tablet Take 1 tablet (10 mg total) by mouth at bedtime.   pantoprazole (PROTONIX) 40 MG tablet Take 1 tablet (40 mg total) by mouth daily.   potassium chloride (KLOR-CON) 10 MEQ tablet    [DISCONTINUED] albuterol (VENTOLIN HFA) 108 (90 Base) MCG/ACT inhaler Inhale 2 puffs into the lungs every 6 (six) hours as needed for wheezing or shortness of breath (Asthma).     Allergies:    Aspirin, Avocado, Codeine, Other, Strawberry extract, and Tomato   Social History: Social  History   Socioeconomic History   Marital status: Widowed    Spouse name: Not on file   Number of children: 1   Years of education: Not on file   Highest education level: Not on file  Occupational History   Occupation: [employer]  Tobacco Use   Smoking status: Former    Packs/day: 1.00    Years: 45.00    Pack years: 45.00    Types: Cigarettes    Quit date: 10/2020    Years since quitting: 0.4   Smokeless tobacco: Never  Vaping Use   Vaping Use: Never used  Substance and Sexual Activity   Alcohol use: No    Comment: somtimes   Drug use: No   Sexual activity: Never  Other Topics Concern   Not on file  Social History Narrative   Not on file   Social Determinants of Health   Financial Resource Strain: Not on file  Food Insecurity: Not on file  Transportation Needs: Not on file  Physical Activity: Not on file  Stress: Not on file  Social Connections: Not on file     Family History: The patient's family history is negative for Cancer, Diabetes, Hyperlipidemia, Hypertension, Kidney disease, and Stroke. She was adopted.  ROS:   All other ROS reviewed and negative. Pertinent positives noted in the HPI.     EKGs/Labs/Other Studies Reviewed:   The following studies were personally reviewed by me today:  EKG: EKG was ordered in office today which demonstrates sinus rhythm heart rate 59, PVCs noted, inferiorly directed, transition V2 V3   LHC 12/11/2020   Dist LM to Prox LAD lesion is 50% stenosed.   Mid LM to Dist LM lesion is 40% stenosed.   Ost Cx to Prox Cx lesion is 70% stenosed.   Ramus lesion is 60% stenosed.   Mid Cx to Dist Cx lesion is 95% stenosed.   Prox RCA lesion is 80% stenosed.   Prox RCA to Mid RCA lesion is 99% stenosed.   Mid RCA lesion is 99% stenosed.   RPAV lesion is 100% stenosed.   TTE 12/04/2020  1. Left ventricular ejection fraction, by estimation, is 60 to 65%. The  left ventricle has normal function. The left ventricle has no regional   wall motion abnormalities. Left ventricular diastolic parameters were  normal. Elevated left ventricular  end-diastolic pressure.   2. Right ventricular systolic function is normal. The right ventricular  size is normal.   3. The mitral valve is normal in structure. Mild mitral valve  regurgitation. No evidence of mitral stenosis.   4. The aortic valve is tricuspid. Aortic valve regurgitation is mild.  Mild  to moderate aortic valve sclerosis/calcification is present, without  any evidence of aortic stenosis. Aortic regurgitation PHT measures 487  msec.   5. The inferior vena cava is dilated in size with >50% respiratory  variability, suggesting right atrial pressure of 8 mmHg.    Recent Labs: 06/24/2020: ALT 13 12/03/2020: TSH 5.050 12/12/2020: BUN 16; Creatinine, Ser 0.90; Hemoglobin 9.4; Platelets 202; Potassium 4.5; Sodium 142   Recent Lipid Panel    Component Value Date/Time   CHOL 274 (H) 06/24/2020 1059   TRIG 192 (H) 06/24/2020 1059   HDL 68 06/24/2020 1059   CHOLHDL 4.0 06/24/2020 1059   CHOLHDL 4 01/04/2012 1530   VLDL 24.8 01/04/2012 1530   LDLCALC 171 (H) 06/24/2020 1059   LDLDIRECT 207.2 01/04/2012 1530    Physical Exam:   VS:  BP 124/86   Pulse 98   Ht 5\' 7"  (1.702 m)   Wt 273 lb (123.8 kg)   SpO2 95%   BMI 42.76 kg/m    Wt Readings from Last 3 Encounters:  03/25/21 273 lb (123.8 kg)  02/03/21 271 lb 6.4 oz (123.1 kg)  01/14/21 280 lb (127 kg)    General: Well nourished, well developed, in no acute distress Head: Atraumatic, normal size  Eyes: PEERLA, EOMI  Neck: Supple, no JVD Endocrine: No thryomegaly Cardiac: Normal S1, S2; RRR; no murmurs, rubs, or gallops Lungs: Clear to auscultation bilaterally, no wheezing, rhonchi or rales  Abd: Soft, nontender, no hepatomegaly  Ext: No edema, pulses 2+ Musculoskeletal: No deformities, BUE and BLE strength normal and equal Skin: Warm and dry, no rashes   Neuro: Alert and oriented to person, place, time, and  situation, CNII-XII grossly intact, no focal deficits  Psych: Normal mood and affect   ASSESSMENT:   Bethany Beard is a 60 y.o. female who presents for the following: 1. Coronary artery disease of native artery of native heart with stable angina pectoris (HCC)   2. Mixed hyperlipidemia   3. Primary hypertension   4. SOB (shortness of breath)   5. Chronic diastolic heart failure (HCC)   6. PVC (premature ventricular contraction)     PLAN:   1. Coronary artery disease of native artery of native heart with stable angina pectoris (HCC) 2. Mixed hyperlipidemia -She has an occluded distal RCA with left-to-right collaterals.  She has a 99% mid RCA.  She also has distal obstructive disease in the circumflex that is not amenable to PCI.  She also has a 50% mid RCA.  Her RCA disease would require extensive stenting.  Medical management was recommended.  She seems to be doing well on medical management.  She describes no exertional chest pain or pressure.  Would recommend to continue aspirin 81 mg daily as well as Plavix 75 mg daily.  She also is on amlodipine 10 mg daily as well as Imdur 30 mg daily.  Blood pressure is well controlled and she seems to be doing well on medical therapy.  We will continue this as I do not believe extensive stenting of the RCA is warranted as this would expose her to high risk for ISR as well as stent thrombosis. -She remains on statin therapy.  Needs repeat lipid profile.  We will have her come back for this.  She is on Lipitor 40 mg daily.  Goal LDL less than 50.  3. Primary hypertension -No change to medications.  Continue losartan 25 mg daily amlodipine 10 mg daily, Imdur 30 mg daily.  4. SOB (shortness of  breath) -She does report shortness of breath.  She is actually lost 10 pounds.  She quit smoking as well.  Her hemoglobin value was 9.4 in August.  This not been rechecked.  I would like to recheck full labs on her.  This includes CBC, TSH BMP she needs repeat lipids  anyway. -She does not have any evidence of volume overload.  I think we should just continue with medical therapy for now.  We will see what her labs show.  5. Chronic diastolic heart failure (HCC) -Euvolemic on exam.  Continue Lasix 20 mg daily.  I would like to add Farxiga 10 mg daily as well.  6. PVCs -PVCs captured on EKG today.  Demonstrate inferior direction with transition V2 V3 suggestive of RVOT outflow tract PVCs.  Reports occasional palpitations.  Recommended 3-day ZIO to exclude any tremendous PVC burden.  Disposition: Return in about 6 months (around 09/23/2021).  Medication Adjustments/Labs and Tests Ordered: Current medicines are reviewed at length with the patient today.  Concerns regarding medicines are outlined above.  Orders Placed This Encounter  Procedures   Basic metabolic panel   CBC   TSH   Lipid panel   LONG TERM MONITOR (3-14 DAYS)    Meds ordered this encounter  Medications   albuterol (VENTOLIN HFA) 108 (90 Base) MCG/ACT inhaler    Sig: Inhale 2 puffs into the lungs every 6 (six) hours as needed for wheezing or shortness of breath (Asthma).    Dispense:  1 each    Refill:  3   dapagliflozin propanediol (FARXIGA) 10 MG TABS tablet    Sig: Take 1 tablet (10 mg total) by mouth daily.    Dispense:  30 tablet    Refill:  3     Patient Instructions  Medication Instructions:  Start Farxiga 10 mg daily The current medical regimen is effective;  continue present plan and medications.  *If you need a refill on your cardiac medications before your next appointment, please call your pharmacy*   Lab Work: CBC, BMET, TSH, LIPID   If you have labs (blood work) drawn today and your tests are completely normal, you will receive your results only by: MyChart Message (if you have MyChart) OR A paper copy in the mail If you have any lab test that is abnormal or we need to change your treatment, we will call you to review the results.  Testing:  ZIO XT- Long  Term Monitor Instructions  Your physician has requested you wear a ZIO patch monitor for 3 days.  This is a single patch monitor. [employer] supplies one patch monitor per enrollment. Additional stickers are not available. Please do not apply patch if you will be having a Nuclear Stress Test,  Echocardiogram, Cardiac CT, MRI, or Chest Xray during the period you would be wearing the  monitor. The patch cannot be worn during these tests. You cannot remove and re-apply the  ZIO XT patch monitor.  Your ZIO patch monitor will be mailed 3 day USPS to your address on file. It may take 3-5 days  to receive your monitor after you have been enrolled.  Once you have received your monitor, please review the enclosed instructions. Your monitor  has already been registered assigning a specific monitor serial # to you.  Billing and Patient Assistance Program Information  We have supplied [employer] with any of your insurance information on file for billing purposes. [employer] offers a sliding scale Patient Assistance Program for patients that do not  have  insurance, or whose insurance does not completely cover the cost of the ZIO monitor.  You must apply for the Patient Assistance Program to qualify for this discounted rate.  To apply, please call [employer] at [phone], select option 4, select option 2, ask to apply for  Patient Assistance Program. Meredeth Ide will ask your household income, and how many people  are in your household. They will quote your out-of-pocket cost based on that information.  [employer] will also be able to set up a 59-month, interest-free payment plan if needed.  Applying the monitor   Shave hair from upper left chest.  Hold abrader disc by orange tab. Rub abrader in 40 strokes over the upper left chest as  indicated in your monitor instructions.  Clean area with 4 enclosed alcohol pads. Let dry.  Apply patch as indicated in monitor instructions. Patch will be placed under collarbone on  left  side of chest with arrow pointing upward.  Rub patch adhesive wings for 2 minutes. Remove white label marked "1". Remove the white  label marked "2". Rub patch adhesive wings for 2 additional minutes.  While looking in a mirror, press and release button in center of patch. A small green light will  flash 3-4 times. This will be your only indicator that the monitor has been turned on.  Do not shower for the first 24 hours. You may shower after the first 24 hours.  Press the button if you feel a symptom. You will hear a small click. Record Date, Time and  Symptom in the Patient Logbook.  When you are ready to remove the patch, follow instructions on the last 2 pages of Patient  Logbook. Stick patch monitor onto the last page of Patient Logbook.  Place Patient Logbook in the blue and white box. Use locking tab on box and tape box closed  securely. The blue and white box has prepaid postage on it. Please place it in the mailbox as  soon as possible. Your physician should have your test results approximately 7 days after the  monitor has been mailed back to [facility].  Call [facility] Customer Care at [phone] if you have questions regarding  your ZIO XT patch monitor. Call them immediately if you see an orange light blinking on your  monitor.  If your monitor falls off in less than 4 days, contact our Monitor department at [phone].  If your monitor becomes loose or falls off after 4 days call [employer] at [phone] for  suggestions on securing your monitor   Follow-Up: At [facility], you and your health needs are our priority.  As part of our continuing mission to provide you with exceptional heart care, we have created designated Provider Care Teams.  These Care Teams include your primary Cardiologist (physician) and Advanced Practice Providers (APPs -  Physician Assistants and Nurse Practitioners) who all work together to provide you with the care you need, when  you need it.  We recommend signing up for the patient portal called "MyChart".  Sign up information is provided on this After Visit Summary.  MyChart is used to connect with patients for Virtual Visits (Telemedicine).  Patients are able to view lab/test results, encounter notes, upcoming appointments, etc.  Non-urgent messages can be sent to your provider as well.   To learn more about what you can do with MyChart, go to [email or web address].    Your next appointment:   6 month(s)  The format for your next appointment:  In Person  Provider:   Reatha Harps, MD      Time Spent with Patient: I have spent a total of 35 minutes with patient reviewing hospital notes, telemetry, EKGs, labs and examining the patient as well as establishing an assessment and plan that was discussed with the patient.  > 50% of time was spent in direct patient care.  Signed, Lenna Gilford. Flora Lipps, MD, [facility]  [facility]  [address] [place], [place] [address] [phone]  03/25/2021 5:51 PM

## 2021-03-25 ENCOUNTER — Ambulatory Visit (INDEPENDENT_AMBULATORY_CARE_PROVIDER_SITE_OTHER): Payer: BC Managed Care – PPO | Admitting: Cardiovascular Disease

## 2021-03-25 ENCOUNTER — Ambulatory Visit (INDEPENDENT_AMBULATORY_CARE_PROVIDER_SITE_OTHER): Payer: BC Managed Care – PPO

## 2021-03-25 ENCOUNTER — Encounter: Payer: Self-pay | Admitting: Cardiovascular Disease

## 2021-03-25 ENCOUNTER — Other Ambulatory Visit: Payer: Self-pay

## 2021-03-25 VITALS — BP 124/86 | HR 98 | Ht 67.0 in | Wt 273.0 lb

## 2021-03-25 DIAGNOSIS — I10 Essential (primary) hypertension: Secondary | ICD-10-CM

## 2021-03-25 DIAGNOSIS — I493 Ventricular premature depolarization: Secondary | ICD-10-CM

## 2021-03-25 DIAGNOSIS — E782 Mixed hyperlipidemia: Secondary | ICD-10-CM

## 2021-03-25 DIAGNOSIS — I5032 Chronic diastolic (congestive) heart failure: Secondary | ICD-10-CM

## 2021-03-25 DIAGNOSIS — I25118 Atherosclerotic heart disease of native coronary artery with other forms of angina pectoris: Secondary | ICD-10-CM | POA: Diagnosis not present

## 2021-03-25 DIAGNOSIS — R0602 Shortness of breath: Secondary | ICD-10-CM

## 2021-03-25 DIAGNOSIS — Z720 Tobacco use: Secondary | ICD-10-CM

## 2021-03-25 MED ORDER — ALBUTEROL SULFATE HFA 108 (90 BASE) MCG/ACT IN AERS
2.0000 | INHALATION_SPRAY | Freq: Four times a day (QID) | RESPIRATORY_TRACT | 3 refills | Status: DC | PRN
Start: 2021-03-25 — End: 2024-01-25

## 2021-03-25 MED ORDER — DAPAGLIFLOZIN PROPANEDIOL 10 MG PO TABS: 10.0000 mg | ORAL_TABLET | Freq: Every day | ORAL | 3 refills | Status: DC

## 2021-03-25 NOTE — Patient Instructions (Addendum)
Medication Instructions:  Start Farxiga 10 mg daily The current medical regimen is effective;  continue present plan and medications.  *If you need a refill on your cardiac medications before your next appointment, please call your pharmacy*   Lab Work: CBC, BMET, TSH, LIPID   If you have labs (blood work) drawn today and your tests are completely normal, you will receive your results only by: MyChart Message (if you have MyChart) OR A paper copy in the mail If you have any lab test that is abnormal or we need to change your treatment, we will call you to review the results.  Testing:  ZIO XT- Long Term Monitor Instructions  Your physician has requested you wear a ZIO patch monitor for 3 days.  This is a single patch monitor. [employer] supplies one patch monitor per enrollment. Additional stickers are not available. Please do not apply patch if you will be having a Nuclear Stress Test,  Echocardiogram, Cardiac CT, MRI, or Chest Xray during the period you would be wearing the  monitor. The patch cannot be worn during these tests. You cannot remove and re-apply the  ZIO XT patch monitor.  Your ZIO patch monitor will be mailed 3 day USPS to your address on file. It may take 3-5 days  to receive your monitor after you have been enrolled.  Once you have received your monitor, please review the enclosed instructions. Your monitor  has already been registered assigning a specific monitor serial # to you.  Billing and Patient Assistance Program Information  We have supplied [employer] with any of your insurance information on file for billing purposes. [employer] offers a sliding scale Patient Assistance Program for patients that do not have  insurance, or whose insurance does not completely cover the cost of the ZIO monitor.  You must apply for the Patient Assistance Program to qualify for this discounted rate.  To apply, please call [employer] at [phone], select option 4, select option 2, ask  to apply for  Patient Assistance Program. Meredeth Ide will ask your household income, and how many people  are in your household. They will quote your out-of-pocket cost based on that information.  [employer] will also be able to set up a 75-month, interest-free payment plan if needed.  Applying the monitor   Shave hair from upper left chest.  Hold abrader disc by orange tab. Rub abrader in 40 strokes over the upper left chest as  indicated in your monitor instructions.  Clean area with 4 enclosed alcohol pads. Let dry.  Apply patch as indicated in monitor instructions. Patch will be placed under collarbone on left  side of chest with arrow pointing upward.  Rub patch adhesive wings for 2 minutes. Remove white label marked "1". Remove the white  label marked "2". Rub patch adhesive wings for 2 additional minutes.  While looking in a mirror, press and release button in center of patch. A small green light will  flash 3-4 times. This will be your only indicator that the monitor has been turned on.  Do not shower for the first 24 hours. You may shower after the first 24 hours.  Press the button if you feel a symptom. You will hear a small click. Record Date, Time and  Symptom in the Patient Logbook.  When you are ready to remove the patch, follow instructions on the last 2 pages of Patient  Logbook. Stick patch monitor onto the last page of Patient Logbook.  Place Patient Logbook in the  blue and white box. Use locking tab on box and tape box closed  securely. The blue and white box has prepaid postage on it. Please place it in the mailbox as  soon as possible. Your physician should have your test results approximately 7 days after the  monitor has been mailed back to [facility].  Call [facility] Customer Care at [phone] if you have questions regarding  your ZIO XT patch monitor. Call them immediately if you see an orange light blinking on your  monitor.  If your monitor falls off in  less than 4 days, contact our Monitor department at [phone].  If your monitor becomes loose or falls off after 4 days call [employer] at [phone] for  suggestions on securing your monitor   Follow-Up: At [facility], you and your health needs are our priority.  As part of our continuing mission to provide you with exceptional heart care, we have created designated Provider Care Teams.  These Care Teams include your primary Cardiologist (physician) and Advanced Practice Providers (APPs -  Physician Assistants and Nurse Practitioners) who all work together to provide you with the care you need, when you need it.  We recommend signing up for the patient portal called "MyChart".  Sign up information is provided on this After Visit Summary.  MyChart is used to connect with patients for Virtual Visits (Telemedicine).  Patients are able to view lab/test results, encounter notes, upcoming appointments, etc.  Non-urgent messages can be sent to your provider as well.   To learn more about what you can do with MyChart, go to [email or web address].    Your next appointment:   6 month(s)  The format for your next appointment:   In Person  Provider:   Reatha Harps, MD

## 2021-03-25 NOTE — Progress Notes (Unsigned)
Enrolled patient for a 3 day Zio XT Monitor to be mailed to patients home.  °

## 2021-03-26 ENCOUNTER — Ambulatory Visit (HOSPITAL_COMMUNITY): Payer: BC Managed Care – PPO

## 2021-03-27 ENCOUNTER — Telehealth: Payer: Self-pay

## 2021-03-27 ENCOUNTER — Other Ambulatory Visit (INDEPENDENT_AMBULATORY_CARE_PROVIDER_SITE_OTHER): Payer: BC Managed Care – PPO

## 2021-03-27 DIAGNOSIS — I493 Ventricular premature depolarization: Secondary | ICD-10-CM

## 2021-03-27 NOTE — Telephone Encounter (Signed)
Received phone call from [facility] regarding manufacturer change for Levothyroxine. Brand is being changed from Alvogen to Accord.   Please advise if change is appropriate. It appears that patient is having TSH monitored by Cardiology, so I was unsure if we would still be managing this medication.   Veronda Prude, RN

## 2021-03-28 ENCOUNTER — Ambulatory Visit (HOSPITAL_COMMUNITY): Payer: BC Managed Care – PPO

## 2021-03-28 NOTE — Telephone Encounter (Signed)
Called pharmacy and provided with verbal approval.   Veronda Prude, RN

## 2021-03-30 DIAGNOSIS — I493 Ventricular premature depolarization: Secondary | ICD-10-CM

## 2021-03-31 ENCOUNTER — Ambulatory Visit (HOSPITAL_COMMUNITY): Payer: BC Managed Care – PPO

## 2021-03-31 DIAGNOSIS — I25118 Atherosclerotic heart disease of native coronary artery with other forms of angina pectoris: Secondary | ICD-10-CM | POA: Diagnosis not present

## 2021-03-31 DIAGNOSIS — E782 Mixed hyperlipidemia: Secondary | ICD-10-CM | POA: Diagnosis not present

## 2021-03-31 LAB — CBC
Hematocrit: 34.7 % (ref 34.0–46.6)
Hemoglobin: 10.9 g/dL — ABNORMAL LOW (ref 11.1–15.9)
MCH: 29.1 pg (ref 26.6–33.0)
MCHC: 31.4 g/dL — ABNORMAL LOW (ref 31.5–35.7)
MCV: 93 fL (ref 79–97)
Platelets: 245 10*3/uL (ref 150–450)
RBC: 3.74 x10E6/uL — ABNORMAL LOW (ref 3.77–5.28)
RDW: 13.7 % (ref 11.7–15.4)
WBC: 4.9 10*3/uL (ref 3.4–10.8)

## 2021-03-31 LAB — TSH: TSH: 3.18 u[IU]/mL (ref 0.450–4.500)

## 2021-03-31 LAB — BASIC METABOLIC PANEL
BUN/Creatinine Ratio: 17 (ref 9–23)
BUN: 16 mg/dL (ref 6–24)
CO2: 27 mmol/L (ref 20–29)
Calcium: 9.7 mg/dL (ref 8.7–10.2)
Chloride: 103 mmol/L (ref 96–106)
Creatinine, Ser: 0.96 mg/dL (ref 0.57–1.00)
Glucose: 99 mg/dL (ref 70–99)
Potassium: 4.4 mmol/L (ref 3.5–5.2)
Sodium: 141 mmol/L (ref 134–144)
eGFR: 68 mL/min/{1.73_m2} (ref >59)

## 2021-03-31 LAB — LIPID PANEL
Chol/HDL Ratio: 2.3 ratio (ref 0.0–4.4)
Cholesterol, Total: 169 mg/dL (ref 100–199)
HDL: 72 mg/dL (ref >39)
LDL Chol Calc (NIH): 80 mg/dL (ref 0–99)
Triglycerides: 95 mg/dL (ref 0–149)
VLDL Cholesterol Cal: 17 mg/dL (ref 5–40)

## 2021-04-01 ENCOUNTER — Other Ambulatory Visit: Payer: Self-pay

## 2021-04-01 MED ORDER — CITALOPRAM HYDROBROMIDE 20 MG PO TABS: 20.0000 mg | ORAL_TABLET | Freq: Every day | ORAL | 3 refills | Status: DC

## 2021-04-02 ENCOUNTER — Other Ambulatory Visit: Payer: Self-pay

## 2021-04-02 ENCOUNTER — Ambulatory Visit (HOSPITAL_COMMUNITY): Payer: BC Managed Care – PPO

## 2021-04-02 MED ORDER — ATORVASTATIN CALCIUM 80 MG PO TABS: 80.0000 mg | ORAL_TABLET | Freq: Every day | ORAL | 3 refills | Status: DC

## 2021-04-04 ENCOUNTER — Ambulatory Visit (HOSPITAL_COMMUNITY): Payer: BC Managed Care – PPO

## 2021-04-07 ENCOUNTER — Ambulatory Visit (HOSPITAL_COMMUNITY): Payer: BC Managed Care – PPO

## 2021-04-08 ENCOUNTER — Other Ambulatory Visit: Payer: Self-pay

## 2021-04-09 ENCOUNTER — Ambulatory Visit (HOSPITAL_COMMUNITY): Payer: BC Managed Care – PPO

## 2021-04-11 ENCOUNTER — Ambulatory Visit (HOSPITAL_COMMUNITY): Payer: BC Managed Care – PPO

## 2021-04-16 ENCOUNTER — Ambulatory Visit (HOSPITAL_COMMUNITY): Payer: BC Managed Care – PPO

## 2021-04-18 ENCOUNTER — Ambulatory Visit (HOSPITAL_COMMUNITY): Payer: BC Managed Care – PPO

## 2021-05-07 ENCOUNTER — Other Ambulatory Visit: Payer: Self-pay

## 2021-05-07 MED ORDER — METOPROLOL SUCCINATE ER 25 MG PO TB24: 25.0000 mg | ORAL_TABLET | Freq: Every day | ORAL | 1 refills | Status: DC

## 2021-05-09 ENCOUNTER — Telehealth: Payer: Self-pay | Admitting: Cardiovascular Disease

## 2021-05-09 NOTE — Telephone Encounter (Signed)
° °  Pre-operative Risk Assessment    Patient Name: Bethany Beard  DOB: Feb 25, 1961 MRN: [id number]      Request for Surgical Clearance    Procedure:   Right Knee Scope  Date of Surgery:  2-9-23Clearance                                  Surgeon:  Dr Jerl Santos Surgeon's Group or Practice Name:   Phone number:  [phone] Fax number:  [phone]   Type of Clearance Requested:  Both- Plavix    Type of Anesthesia:  Choice Choice  Additional requests/questions:    Signed, Laurence Ferrari   05/09/2021, 9:26 AM

## 2021-05-09 NOTE — Telephone Encounter (Signed)
° °  Primary Cardiologist: Reatha Harps, MD  Chart reviewed as part of pre-operative protocol coverage. Given past medical history and time since last visit, based on ACC/AHA guidelines, Bethany Beard would be at acceptable risk for the planned procedure without further cardiovascular testing.   Her Plavix may be held for 5 days prior to her surgery.  Please resume as soon as hemostasis is achieved.  She will need to continue her aspirin throughout her procedure.  I will route this recommendation to the requesting party via Epic fax function and remove from pre-op pool.  Please call with questions.  Thomasene Ripple. Graceanne Guin NP-C    05/09/2021, 10:31 AM [facility] [address] Office [phone] Fax [phone]

## 2021-05-12 ENCOUNTER — Telehealth: Payer: Self-pay | Admitting: Pulmonary Disease

## 2021-05-12 NOTE — Telephone Encounter (Signed)
Please see below for preoperative risk evaluation  Pulmonary medicine does not provide preoperative clearance but rather preoperative risk assessment.  Based on the [place] with recent lab work notable for hemoglobin greater than 10, peripheral nature of the incision for right knee arthroplasty, and most recent O2 sat 95%, patient is lower 1.3% risk of postoperative pulmonary complication assuming duration of surgery is less than 2 hours or intermediate / 13.3% risk of postoperative pulmonary complication assuming duration of surgery is between greater than 2 hours.  There are no modifiable risk factors to intervene upon prior to surgery. Recommendations: Use local anesthetic, nerve blocks and avoid general anesthesia if able If generalized seizure is needed, avoid prolonged neuromuscular blockade Provide DuoNebs preoperatively Recommend checking blood gas during operation to assess for CO2 retention If patient is retaining CO2, recommend extubation to BiPAP If patient is retaining CO2, recommend post extubation blood gas to assess for CO2 retention Please provide duo nebs in the PACU Provide nebulized corticosteroid and nebulized long-acting beta agonist in the hospital if she is admitted

## 2021-05-12 NOTE — Telephone Encounter (Signed)
Ov notes and clearance form have been faxed back to [facility]. Nothing further needed at this time.  °

## 2021-05-12 NOTE — Telephone Encounter (Signed)
Fax received from Dr. Melrose Nakayama with [facility] to perform a Right knee arthroplasty on patient on 05/29/21.  Patient needs surgery clearance. Patient was seen on 02/03/21. Office protocol is a risk assessment can be sent to surgeon if patient has been seen in 60 days or less.   Sending to Dr. Silas Flood for risk assessment or recommendations if patient needs to be seen in office prior to surgical procedure. Patient is just past the 60 day mark. Please do risk assessment if that's ok or let me know if she need follow up ASAP since surgery is scheduled for 05/29/21

## 2021-05-30 NOTE — Patient Instructions (Addendum)
DUE TO COVID-19 ONLY ONE VISITOR IS ALLOWED TO COME WITH YOU AND STAY IN THE WAITING ROOM ONLY DURING PRE OP AND PROCEDURE.   **NO VISITORS ARE ALLOWED IN THE SHORT STAY AREA OR RECOVERY ROOM!!**  IF YOU WILL BE ADMITTED INTO THE HOSPITAL YOU ARE ALLOWED ONLY TWO SUPPORT PEOPLE DURING VISITATION HOURS ONLY (7 AM -8PM)   The support person(s) must pass our screening, gel in and out, and wear a mask at all times, including in the patients room. Patients must also wear a mask when staff or their support person are in the room. Visitors GUEST BADGE MUST BE WORN VISIBLY  One adult visitor may remain with you overnight and MUST be in the room by 8 P.M.  No visitors under the age of 69. Any visitor under the age of 43 must be accompanied by an adult.        Your procedure is scheduled on: 06/24/21   Report to [facility] Main Entrance    Report to admitting at : 7:10 AM   Call this number if you have problems the morning of surgery [phone]   Do not eat food :After Midnight.   May have liquids until : 6:45 AM   day of surgery  CLEAR LIQUID DIET  Foods Allowed                                                                     Foods Excluded  Water, Black Coffee and tea, regular and decaf                             liquids that you cannot  Plain Jell-O in any flavor  (No red)                                           see through such as: Fruit ices (not with fruit pulp)                                     milk, soups, orange juice              Iced Popsicles (No red)                                    All solid food                                   Apple juices Sports drinks like Gatorade (No red) Lightly seasoned clear broth or consume(fat free) Sugar Sample Menu Breakfast                                Lunch  Supper Cranberry juice                    Beef broth                            Chicken broth Jell-O                                      Grape juice                           Apple juice Coffee or tea                        Jell-O                                      Popsicle                                                Coffee or tea                        Coffee or tea     FOLLOW BOWEL PREP AND ANY ADDITIONAL PRE OP INSTRUCTIONS YOU RECEIVED FROM YOUR SURGEON'S OFFICE!!!   Oral Hygiene is also important to reduce your risk of infection.                                    Remember - BRUSH YOUR TEETH THE MORNING OF SURGERY WITH YOUR REGULAR TOOTHPASTE   Do NOT smoke after Midnight   Take these medicines the morning of surgery with A SIP OF WATER: isosorbide,citalopram,metoprolol,amlodipine,levothyroxine. How to Manage Your Diabetes Before and After Surgery  Why is it important to control my blood sugar before and after surgery? Improving blood sugar levels before and after surgery helps healing and can limit problems. A way of improving blood sugar control is eating a healthy diet by:  Eating less sugar and carbohydrates  Increasing activity/exercise  Talking with your doctor about reaching your blood sugar goals High blood sugars (greater than 180 mg/dL) can raise your risk of infections and slow your recovery, so you will need to focus on controlling your diabetes during the weeks before surgery. Make sure that the doctor who takes care of your diabetes knows about your planned surgery including the date and location.  How do I manage my blood sugar before surgery? Check your blood sugar at least 4 times a day, starting 2 days before surgery, to make sure that the level is not too high or low. Check your blood sugar the morning of your surgery when you wake up and every 2 hours until you get to the Short Stay unit. If your blood sugar is less than 70 mg/dL, you will need to treat for low blood sugar: Do not take insulin. Treat a low blood sugar (less than 70 mg/dL) with  cup of clear juice (cranberry or apple),  4 glucose tablets, OR glucose gel. Recheck blood sugar in  15 minutes after treatment (to make sure it is greater than 70 mg/dL). If your blood sugar is not greater than 70 mg/dL on recheck, call [phone] for further instructions. Report your blood sugar to the short stay nurse when you get to Short Stay.  If you are admitted to the hospital after surgery: Your blood sugar will be checked by the staff and you will probably be given insulin after surgery (instead of oral diabetes medicines) to make sure you have good blood sugar levels. The goal for blood sugar control after surgery is 80-180 mg/dL.   WHAT DO I DO ABOUT MY DIABETES MEDICATION?  Do not take oral diabetes medicines (pills) the morning of surgery.  THE NIGHT BEFORE SURGERY, DO NOT take farxiga.      THE MORNING OF SURGERY,DO NOT TAKE ANY ORAL DIABETIC MEDICATIONS DAY OF YOUR SURGERY                              You may not have any metal on your body including hair pins, jewelry, and body piercing             Do not wear make-up, lotions, powders, perfumes/cologne, or deodorant  Do not wear nail polish including gel and S&S, artificial/acrylic nails, or any other type of covering on natural nails including finger and toenails. If you have artificial nails, gel coating, etc. that needs to be removed by a nail salon please have this removed prior to surgery or surgery may need to be canceled/ delayed if the surgeon/ anesthesia feels like they are unable to be safely monitored.   Do not shave  48 hours prior to surgery.    Do not bring valuables to the hospital. [facility] IS NOT             RESPONSIBLE   FOR VALUABLES.   Contacts, dentures or bridgework may not be worn into surgery.   Bring small overnight bag day of surgery.    Patients discharged on the day of surgery will not be allowed to drive home.  Someone needs to stay with you for the first 24 hours after anesthesia.   Special Instructions: Bring a copy of  your healthcare power of attorney and living will documents         the day of surgery if you haven't scanned them before.              Please read over the following fact sheets you were given: IF YOU HAVE QUESTIONS ABOUT YOUR PRE-OP INSTRUCTIONS PLEASE CALL [phone]     [facility] - Preparing for Surgery Before surgery, you can play an important role.  Because skin is not sterile, your skin needs to be as free of germs as possible.  You can reduce the number of germs on your skin by washing with CHG (chlorahexidine gluconate) soap before surgery.  CHG is an antiseptic cleaner which kills germs and bonds with the skin to continue killing germs even after washing. Please DO NOT use if you have an allergy to CHG or antibacterial soaps.  If your skin becomes reddened/irritated stop using the CHG and inform your nurse when you arrive at Short Stay. Do not shave (including legs and underarms) for at least 48 hours prior to the first CHG shower.  You may shave your face/neck. Please follow these instructions carefully:  1.  Shower with CHG Soap the night before surgery and the  morning of Surgery.  2.  If you choose to wash your hair, wash your hair first as usual with your  normal  shampoo.  3.  After you shampoo, rinse your hair and body thoroughly to remove the  shampoo.                           4.  Use CHG as you would any other liquid soap.  You can apply chg directly  to the skin and wash                       Gently with a scrungie or clean washcloth.  5.  Apply the CHG Soap to your body ONLY FROM THE NECK DOWN.   Do not use on face/ open                           Wound or open sores. Avoid contact with eyes, ears mouth and genitals (private parts).                       Wash face,  Genitals (private parts) with your normal soap.             6.  Wash thoroughly, paying special attention to the area where your surgery  will be performed.  7.  Thoroughly rinse your body with warm water from  the neck down.  8.  DO NOT shower/wash with your normal soap after using and rinsing off  the CHG Soap.                9.  Pat yourself dry with a clean towel.            10.  Wear clean pajamas.            11.  Place clean sheets on your bed the night of your first shower and do not  sleep with pets. Day of Surgery : Do not apply any lotions/deodorants the morning of surgery.  Please wear clean clothes to the hospital/surgery center.  FAILURE TO FOLLOW THESE INSTRUCTIONS MAY RESULT IN THE CANCELLATION OF YOUR SURGERY PATIENT SIGNATURE_________________________________  NURSE SIGNATURE__________________________________  ________________________________________________________________________

## 2021-05-30 NOTE — Progress Notes (Signed)
Pt. Need orders for upcoming surgery. °

## 2021-06-03 ENCOUNTER — Encounter (HOSPITAL_COMMUNITY)
Admission: RE | Admit: 2021-06-03 | Discharge: 2021-06-03 | Disposition: A | Discharge status: Home / Self Care | Payer: BC Managed Care – PPO | Source: Ambulatory Visit | Attending: Orthopaedic Surgery | Admitting: Orthopaedic Surgery

## 2021-06-03 ENCOUNTER — Other Ambulatory Visit: Payer: Self-pay

## 2021-06-03 ENCOUNTER — Encounter (HOSPITAL_COMMUNITY): Payer: Self-pay

## 2021-06-03 VITALS — BP 150/58 | HR 47 | Temp 98.3°F | Resp 18 | Ht 67.0 in | Wt 268.4 lb

## 2021-06-03 DIAGNOSIS — E119 Type 2 diabetes mellitus without complications: Secondary | ICD-10-CM | POA: Diagnosis not present

## 2021-06-03 DIAGNOSIS — Z01812 Encounter for preprocedural laboratory examination: Secondary | ICD-10-CM | POA: Insufficient documentation

## 2021-06-03 DIAGNOSIS — I10 Essential (primary) hypertension: Secondary | ICD-10-CM | POA: Insufficient documentation

## 2021-06-03 HISTORY — DX: Atherosclerotic heart disease of native coronary artery without angina pectoris: I25.10

## 2021-06-03 HISTORY — DX: Angina pectoris, unspecified: I20.9

## 2021-06-03 HISTORY — DX: Hypothyroidism, unspecified: E03.9

## 2021-06-03 HISTORY — DX: Unspecified osteoarthritis, unspecified site: M19.90

## 2021-06-03 HISTORY — DX: Dyspnea, unspecified: R06.00

## 2021-06-03 HISTORY — DX: Essential (primary) hypertension: I10

## 2021-06-03 LAB — BASIC METABOLIC PANEL
Anion gap: 7 (ref 5–15)
BUN: 24 mg/dL — ABNORMAL HIGH (ref 6–20)
CO2: 25 mmol/L (ref 22–32)
Calcium: 9.3 mg/dL (ref 8.9–10.3)
Chloride: 106 mmol/L (ref 98–111)
Creatinine, Ser: 0.86 mg/dL (ref 0.44–1.00)
GFR, Estimated: >60 mL/min (ref >60)
Glucose, Bld: 113 mg/dL — ABNORMAL HIGH (ref 70–99)
Potassium: 4.2 mmol/L (ref 3.5–5.1)
Sodium: 138 mmol/L (ref 135–145)

## 2021-06-03 LAB — HEMOGLOBIN A1C
Hgb A1c MFr Bld: 6.1 % — ABNORMAL HIGH (ref 4.8–5.6)
Mean Plasma Glucose: 128.37 mg/dL

## 2021-06-03 LAB — CBC
HCT: 34.2 % — ABNORMAL LOW (ref 36.0–46.0)
Hemoglobin: 10.6 g/dL — ABNORMAL LOW (ref 12.0–15.0)
MCH: 28.7 pg (ref 26.0–34.0)
MCHC: 31 g/dL (ref 30.0–36.0)
MCV: 92.7 fL (ref 80.0–100.0)
Platelets: 233 10*3/uL (ref 150–400)
RBC: 3.69 MIL/uL — ABNORMAL LOW (ref 3.87–5.11)
RDW: 14.7 % (ref 11.5–15.5)
WBC: 6.1 10*3/uL (ref 4.0–10.5)
nRBC: 0 % (ref 0.0–0.2)

## 2021-06-03 NOTE — Progress Notes (Signed)
COVID Vaccine Completed: Yes Date COVID Vaccine completed: 06/25/2019 x 3 COVID vaccine manufacturer: Pfizer     COVID Test: N/A Bowel prep reminder: N/A  PCP - DO: Reece Leader Cardiologist - Dr. Mackey Birchwood. Clearance: Thomasene Ripple Cleaver: NP: 05/09/21: EPIC  Chest x-ray - 09/06/20 EKG - 03/25/21 Stress Test -  ECHO - 12/04/20 Cardiac Cath - 12/11/20 Pacemaker/ICD device last checked:  Sleep Study -  CPAP -   Fasting Blood Sugar - Plavix: Will be held 7 days before surgery as per Dr. Jerl Santos instructions. Checks Blood Sugar _____ times a day  Blood Thinner Instructions: Aspirin Instructions: Will be continue. Last Dose:  Anesthesia review: Hx: HTN CAD  Patient denies shortness of breath, fever, cough and chest pain at PAT appointment   Patient verbalized understanding of instructions that were given to them at the PAT appointment. Patient was also instructed that they will need to review over the PAT instructions again at home before surgery.

## 2021-06-03 NOTE — Progress Notes (Signed)
Lab. Results: HGB: 10.6

## 2021-06-05 ENCOUNTER — Encounter: Payer: Self-pay | Admitting: Cardiovascular Disease

## 2021-06-17 NOTE — Progress Notes (Signed)
Received phone call from pt.,she lost her instructions for surgery.RN reviewed all the instructions again with pt.As per pt.,She verbalized her understanding of the instructions.

## 2021-06-23 NOTE — H&P (Signed)
Bethany Beard is an 61 y.o. female.   ?Chief Complaint: Right Knee ?HPI: Bethany Beard persists with right knee pain now 4 months out from her injury which she sustained working at [employer].  She continues to work her regular job there as well as another one at [employer].  Both of these jobs involve extensive walking and standing.  She has having a great deal of difficulty performing the jobs.  She has pain on the inside aspect of her knee which is sharp.  This will wake her from sleep.   ? ?MRI:  I reviewed an MRI scan films and report of a study done at [facility] on 03/26/21.  This shows a complex tear of the medial meniscus with a paucity of degenerative change mostly patellofemoral. ? ? ?Past Medical History:  ?Diagnosis Date  ? Anginal pain (HCC)   ? Arthritis   ? Asthma   ? Coronary artery disease   ? Depression   ? Dyspnea   ? Hyperlipidemia   ? Hypertension   ? Hypothyroidism   ? Migraines   ? Pneumonia   ? Thyroid disease   ? ? ?Past Surgical History:  ?Procedure Laterality Date  ? BACK SURGERY    ? carpel    ? CHOLECYSTECTOMY    ? cyst remove Left   ? LEFT HEART CATH AND CORONARY ANGIOGRAPHY N/A 12/11/2020  ? Procedure: LEFT HEART CATH AND CORONARY ANGIOGRAPHY;  Surgeon: Kathleene Hazel, MD;  Location: [facility] INVASIVE CV LAB;  Service: Cardiovascular;  Laterality: N/A;  ? TONSILLECTOMY    ? ? ?Family History  ?Adopted: Yes  ?Problem Relation Age of Onset  ? Cancer Neg Hx   ? Diabetes Neg Hx   ? Hyperlipidemia Neg Hx   ? Hypertension Neg Hx   ? Kidney disease Neg Hx   ? Stroke Neg Hx   ? ?Social History:  reports that she quit smoking about 8 months ago. Her smoking use included cigarettes. She has a 45.00 pack-year smoking history. She has never used smokeless tobacco. She reports that she does not currently use alcohol. She reports that she does not use drugs. ? ?Allergies:  ?Allergies  ?Allergen Reactions  ? Aspirin Other (See Comments)  ?  Face redness/broken blood vessels red ?Per patient doctor  told her to try 12/05/20  ? Avocado Other (See Comments)  ?  Was told that she was allergic but has never tried them.   ? Codeine Itching and Swelling  ? Other Other (See Comments)  ?  Environmental and cats  ? Strawberry Extract Itching and Rash  ? Tomato Itching and Rash  ? ? ?No medications prior to admission.  ? ? ?No results found for this or any previous visit (from the past 48 hour(s)). ?No results found. ? ?Review of Systems  ?Musculoskeletal:  Positive for arthralgias.  ?     Right knee  ?All other systems reviewed and are negative. ? ?There were no vitals taken for this visit. ?Physical Exam ?Constitutional:   ?   Appearance: Normal appearance.  ?HENT:  ?   Head: Normocephalic and atraumatic.  ?   Mouth/Throat:  ?   Pharynx: Oropharynx is clear.  ?Eyes:  ?   Extraocular Movements: Extraocular movements intact.  ?Cardiovascular:  ?   Rate and Rhythm: Normal rate.  ?Pulmonary:  ?   Effort: Pulmonary effort is normal.  ?Abdominal:  ?   Palpations: Abdomen is soft.  ?Musculoskeletal:  ?   Cervical back: Normal  range of motion.  ?   Comments: Right knee has trace effusion.  Her motion is about 0-100? at which point she has significant pain.  She has pain along the medial joint line with mild crepitation.  Hip motion is full and straight leg raise is negative.  Sensation and motor function are intact in her feet with palpable pulses on both sides.    ?Skin: ?   General: Skin is warm and dry.  ?Neurological:  ?   General: No focal deficit present.  ?   Mental Status: She is alert and oriented to person, place, and time.  ?Psychiatric:     ?   Mood and Affect: Mood normal.     ?   Behavior: Behavior normal.     ?   Thought Content: Thought content normal.     ?   Judgment: Judgment normal.  ?  ? ?Assessment/Plan ?Assessment:  Right knee torn medial meniscus by MRI 2022 with arthroscopy by Dr. Renae Fickle 1998 ? ?Plan: Bethany Beard would be best served with a knee arthroscopy.  She has a medial meniscus tear with a paucity of  degenerative change.  She has been symptomatic for 2-1/2 months and has difficulty twisting and walking very far as well as resting at night.. I reviewed risks of anesthesia and infection as well as potential for DVT related to a knee arthroscopy.  I've stressed the importance of some postoperative physical therapy to optimize results and we will try to set up an appointment.  Two to four  weeks for recovery would be typical but that is a little variable.   ? ?Drema Halon, PA-C ?06/23/2021, 8:26 AM ? ? ? ?

## 2021-06-24 ENCOUNTER — Ambulatory Visit (HOSPITAL_COMMUNITY)
Admission: RE | Admit: 2021-06-24 | Discharge: 2021-06-24 | Disposition: A | Discharge status: Home / Self Care | Payer: Worker's Compensation | Attending: Orthopaedic Surgery | Admitting: Orthopaedic Surgery

## 2021-06-24 ENCOUNTER — Ambulatory Visit (HOSPITAL_COMMUNITY): Payer: Worker's Compensation | Admitting: Anesthesiology

## 2021-06-24 ENCOUNTER — Encounter (HOSPITAL_COMMUNITY)
Admission: RE | Disposition: A | Discharge status: Home / Self Care | Payer: Self-pay | Source: Home / Self Care | Attending: Orthopaedic Surgery

## 2021-06-24 ENCOUNTER — Other Ambulatory Visit: Payer: Self-pay | Admitting: Orthopaedic Surgery

## 2021-06-24 ENCOUNTER — Ambulatory Visit (HOSPITAL_BASED_OUTPATIENT_CLINIC_OR_DEPARTMENT_OTHER): Payer: Worker's Compensation | Admitting: Anesthesiology

## 2021-06-24 ENCOUNTER — Encounter (HOSPITAL_COMMUNITY): Payer: Self-pay | Admitting: Orthopaedic Surgery

## 2021-06-24 DIAGNOSIS — X58XXXA Exposure to other specified factors, initial encounter: Secondary | ICD-10-CM | POA: Insufficient documentation

## 2021-06-24 DIAGNOSIS — I25119 Atherosclerotic heart disease of native coronary artery with unspecified angina pectoris: Secondary | ICD-10-CM | POA: Diagnosis not present

## 2021-06-24 DIAGNOSIS — S83231A Complex tear of medial meniscus, current injury, right knee, initial encounter: Secondary | ICD-10-CM | POA: Diagnosis not present

## 2021-06-24 DIAGNOSIS — Z6841 Body Mass Index (BMI) 40.0 and over, adult: Secondary | ICD-10-CM | POA: Diagnosis not present

## 2021-06-24 DIAGNOSIS — M2241 Chondromalacia patellae, right knee: Secondary | ICD-10-CM | POA: Insufficient documentation

## 2021-06-24 DIAGNOSIS — Z87891 Personal history of nicotine dependence: Secondary | ICD-10-CM | POA: Insufficient documentation

## 2021-06-24 DIAGNOSIS — I10 Essential (primary) hypertension: Secondary | ICD-10-CM | POA: Insufficient documentation

## 2021-06-24 DIAGNOSIS — G8929 Other chronic pain: Secondary | ICD-10-CM

## 2021-06-24 DIAGNOSIS — Z7902 Long term (current) use of antithrombotics/antiplatelets: Secondary | ICD-10-CM | POA: Diagnosis not present

## 2021-06-24 DIAGNOSIS — S83241A Other tear of medial meniscus, current injury, right knee, initial encounter: Secondary | ICD-10-CM

## 2021-06-24 DIAGNOSIS — M94261 Chondromalacia, right knee: Secondary | ICD-10-CM | POA: Diagnosis not present

## 2021-06-24 HISTORY — PX: KNEE ARTHROSCOPY: SHX127

## 2021-06-24 SURGERY — ARTHROSCOPY, KNEE
Anesthesia: General | Site: Knee | Laterality: Right

## 2021-06-24 MED ORDER — ACETAMINOPHEN 10 MG/ML IV SOLN
1000.0000 mg | Freq: Once | INTRAVENOUS | Status: AC
  Administered 2021-06-24: 1000 mg via INTRAVENOUS

## 2021-06-24 MED ORDER — FENTANYL CITRATE PF 50 MCG/ML IJ SOSY
25.0000 ug | PREFILLED_SYRINGE | INTRAMUSCULAR | Status: DC | PRN
  Administered 2021-06-24: 25 ug via INTRAVENOUS

## 2021-06-24 MED ORDER — EPHEDRINE SULFATE-NACL 50-0.9 MG/10ML-% IV SOSY
PREFILLED_SYRINGE | INTRAVENOUS | Status: DC | PRN
  Administered 2021-06-24: 5 mg via INTRAVENOUS

## 2021-06-24 MED ORDER — CHLORHEXIDINE GLUCONATE 0.12 % MT SOLN
15.0000 mL | Freq: Once | OROMUCOSAL | Status: AC
  Administered 2021-06-24: 15 mL via OROMUCOSAL

## 2021-06-24 MED ORDER — PROPOFOL 10 MG/ML IV BOLUS
INTRAVENOUS | Status: AC
  Filled 2021-06-24: qty 20

## 2021-06-24 MED ORDER — DEXAMETHASONE SODIUM PHOSPHATE 10 MG/ML IJ SOLN
INTRAMUSCULAR | Status: DC | PRN
  Administered 2021-06-24: 10 mg via INTRAVENOUS

## 2021-06-24 MED ORDER — EPINEPHRINE (ANAPHYLAXIS) 1 MG/ML IJ SOLN
INTRAMUSCULAR | Status: DC | PRN
  Administered 2021-06-24: 1 mg

## 2021-06-24 MED ORDER — MIDAZOLAM HCL 2 MG/2ML IJ SOLN
INTRAMUSCULAR | Status: AC
  Filled 2021-06-24: qty 2

## 2021-06-24 MED ORDER — LACTATED RINGERS IV SOLN: INTRAVENOUS | Status: DC

## 2021-06-24 MED ORDER — CEFAZOLIN IN SODIUM CHLORIDE 3-0.9 GM/100ML-% IV SOLN
3.0000 g | INTRAVENOUS | Status: AC
  Administered 2021-06-24: 3 g via INTRAVENOUS
  Filled 2021-06-24: qty 100

## 2021-06-24 MED ORDER — ACETAMINOPHEN 10 MG/ML IV SOLN
INTRAVENOUS | Status: AC
  Filled 2021-06-24: qty 100

## 2021-06-24 MED ORDER — MIDAZOLAM HCL 5 MG/5ML IJ SOLN
INTRAMUSCULAR | Status: DC | PRN
  Administered 2021-06-24: 2 mg via INTRAVENOUS

## 2021-06-24 MED ORDER — EPINEPHRINE PF 1 MG/ML IJ SOLN
INTRAMUSCULAR | Status: AC
  Filled 2021-06-24: qty 1

## 2021-06-24 MED ORDER — ONDANSETRON HCL 4 MG/2ML IJ SOLN
INTRAMUSCULAR | Status: AC
  Filled 2021-06-24: qty 2

## 2021-06-24 MED ORDER — BUPIVACAINE-EPINEPHRINE (PF) 0.5% -1:200000 IJ SOLN
INTRAMUSCULAR | Status: AC
  Filled 2021-06-24: qty 30

## 2021-06-24 MED ORDER — ALBUTEROL SULFATE (2.5 MG/3ML) 0.083% IN NEBU: 2.5000 mg | INHALATION_SOLUTION | Freq: Once | RESPIRATORY_TRACT | Status: AC

## 2021-06-24 MED ORDER — PROPOFOL 10 MG/ML IV BOLUS
INTRAVENOUS | Status: DC | PRN
  Administered 2021-06-24 (×2): 100 mg via INTRAVENOUS
  Administered 2021-06-24: 20 mg via INTRAVENOUS

## 2021-06-24 MED ORDER — PROPOFOL 10 MG/ML IV BOLUS
INTRAVENOUS | Status: AC
Start: 2021-06-24 — End: ?
  Filled 2021-06-24: qty 20

## 2021-06-24 MED ORDER — HYDROCODONE-ACETAMINOPHEN 5-325 MG PO TABS: 1.0000 | ORAL_TABLET | Freq: Four times a day (QID) | ORAL | 0 refills | Status: DC | PRN

## 2021-06-24 MED ORDER — DEXAMETHASONE SODIUM PHOSPHATE 10 MG/ML IJ SOLN
INTRAMUSCULAR | Status: AC
  Filled 2021-06-24: qty 1

## 2021-06-24 MED ORDER — MIDAZOLAM HCL 2 MG/2ML IJ SOLN: 1.0000 mg | INTRAMUSCULAR | Status: DC

## 2021-06-24 MED ORDER — ORAL CARE MOUTH RINSE: 15.0000 mL | Freq: Once | OROMUCOSAL | Status: AC

## 2021-06-24 MED ORDER — ALBUTEROL SULFATE (2.5 MG/3ML) 0.083% IN NEBU
INHALATION_SOLUTION | RESPIRATORY_TRACT | Status: AC
  Administered 2021-06-24: 2.5 mg via RESPIRATORY_TRACT
  Filled 2021-06-24: qty 3

## 2021-06-24 MED ORDER — FENTANYL CITRATE PF 50 MCG/ML IJ SOSY: 50.0000 ug | PREFILLED_SYRINGE | INTRAMUSCULAR | Status: DC

## 2021-06-24 MED ORDER — ACETAMINOPHEN 500 MG PO TABS: 1000.0000 mg | ORAL_TABLET | Freq: Once | ORAL | Status: DC

## 2021-06-24 MED ORDER — PHENYLEPHRINE 40 MCG/ML (10ML) SYRINGE FOR IV PUSH (FOR BLOOD PRESSURE SUPPORT)
PREFILLED_SYRINGE | INTRAVENOUS | Status: DC | PRN
  Administered 2021-06-24: 160 ug via INTRAVENOUS

## 2021-06-24 MED ORDER — ONDANSETRON HCL 4 MG/2ML IJ SOLN
INTRAMUSCULAR | Status: DC | PRN
  Administered 2021-06-24: 4 mg via INTRAVENOUS

## 2021-06-24 MED ORDER — LIDOCAINE HCL (PF) 2 % IJ SOLN
INTRAMUSCULAR | Status: AC
  Filled 2021-06-24: qty 5

## 2021-06-24 MED ORDER — BUPIVACAINE-EPINEPHRINE 0.5% -1:200000 IJ SOLN
INTRAMUSCULAR | Status: DC | PRN
  Administered 2021-06-24: 30 mL

## 2021-06-24 MED ORDER — FENTANYL CITRATE (PF) 100 MCG/2ML IJ SOLN
INTRAMUSCULAR | Status: DC | PRN
  Administered 2021-06-24 (×2): 50 ug via INTRAVENOUS

## 2021-06-24 MED ORDER — SODIUM CHLORIDE 0.9 % IR SOLN
Status: DC | PRN
  Administered 2021-06-24 (×2): 3000 mL

## 2021-06-24 MED ORDER — LIDOCAINE HCL (CARDIAC) PF 100 MG/5ML IV SOSY
PREFILLED_SYRINGE | INTRAVENOUS | Status: DC | PRN
  Administered 2021-06-24: 100 mg via INTRAVENOUS

## 2021-06-24 MED ORDER — FENTANYL CITRATE (PF) 100 MCG/2ML IJ SOLN
INTRAMUSCULAR | Status: AC
  Filled 2021-06-24: qty 2

## 2021-06-24 MED ORDER — FENTANYL CITRATE PF 50 MCG/ML IJ SOSY
PREFILLED_SYRINGE | INTRAMUSCULAR | Status: AC
  Filled 2021-06-24: qty 2

## 2021-06-24 SURGICAL SUPPLY — 38 items
BAG COUNTER SPONGE SURGICOUNT (BAG) ×3 IMPLANT
BAG SPNG CNTER NS LX DISP (BAG) ×1
BAG SURGICOUNT SPONGE COUNTING (BAG) ×1
BLADE EXCALIBUR 4.0MM X 13CM (MISCELLANEOUS)
BLADE EXCALIBUR 4.0X13 (MISCELLANEOUS) IMPLANT
BNDG COHESIVE 1X5 TAN STRL LF (GAUZE/BANDAGES/DRESSINGS) ×2 IMPLANT
BNDG ELASTIC 6X5.8 VLCR STR LF (GAUZE/BANDAGES/DRESSINGS) ×4 IMPLANT
BNDG GAUZE ELAST 4 BULKY (GAUZE/BANDAGES/DRESSINGS) ×4 IMPLANT
BURR OVAL 8 FLU 4.0MM X 13CM (MISCELLANEOUS) ×1
BURR OVAL 8 FLU 4.0X13 (MISCELLANEOUS) ×3 IMPLANT
COVER SURGICAL LIGHT HANDLE (MISCELLANEOUS) IMPLANT
DISSECTOR 3.5MM X 13CM (MISCELLANEOUS) ×4 IMPLANT
DRAPE ARTHROSCOPY W/POUCH 114 (DRAPES) ×4 IMPLANT
DRAPE SHEET LG 3/4 BI-LAMINATE (DRAPES) ×4 IMPLANT
DRAPE U-SHAPE 47X51 STRL (DRAPES) ×4 IMPLANT
DRSG EMULSION OIL 3X3 NADH (GAUZE/BANDAGES/DRESSINGS) ×4 IMPLANT
DRSG PAD ABDOMINAL 8X10 ST (GAUZE/BANDAGES/DRESSINGS) ×4 IMPLANT
DURAPREP 26ML APPLICATOR (WOUND CARE) ×8 IMPLANT
GAUZE 4X4 16PLY ~~LOC~~+RFID DBL (SPONGE) ×4 IMPLANT
GAUZE PETROLATUM 1 X8 (GAUZE/BANDAGES/DRESSINGS) ×2 IMPLANT
GAUZE SPONGE 4X4 12PLY STRL (GAUZE/BANDAGES/DRESSINGS) ×4 IMPLANT
GLOVE SRG 8 PF TXTR STRL LF DI (GLOVE) ×4 IMPLANT
GLOVE SURG ENC MOIS LTX SZ8 (GLOVE) ×8 IMPLANT
GLOVE SURG UNDER POLY LF SZ8 (GLOVE) ×6
GOWN STRL REUS W/ TWL XL LVL3 (GOWN DISPOSABLE) ×4 IMPLANT
GOWN STRL REUS W/TWL XL LVL3 (GOWN DISPOSABLE) ×6
KIT BASIN OR (CUSTOM PROCEDURE TRAY) ×4 IMPLANT
MANIFOLD NEPTUNE II (INSTRUMENTS) ×4 IMPLANT
NDL SPNL 18GX3.5 QUINCKE PK (NEEDLE) IMPLANT
NEEDLE HYPO 22GX1.5 SAFETY (NEEDLE) ×4 IMPLANT
NEEDLE SPNL 18GX3.5 QUINCKE PK (NEEDLE) IMPLANT
PACK ARTHROSCOPY WL (CUSTOM PROCEDURE TRAY) ×4 IMPLANT
PAD ARMBOARD 7.5X6 YLW CONV (MISCELLANEOUS) ×8 IMPLANT
PENCIL SMOKE EVACUATOR (MISCELLANEOUS) IMPLANT
SYR CONTROL 10ML LL (SYRINGE) ×4 IMPLANT
TOWEL OR 17X26 10 PK STRL BLUE (TOWEL DISPOSABLE) ×4 IMPLANT
TUBING ARTHROSCOPY IRRIG 16FT (MISCELLANEOUS) ×4 IMPLANT
WATER STERILE IRR 1000ML POUR (IV SOLUTION) ×4 IMPLANT

## 2021-06-24 NOTE — Brief Op Note (Signed)
Quinn Plowman ?[id number] ?06/24/2021 ? ? ?PRE-OP DIAGNOSIS: right TMM and CP ? ?POST-OP DIAGNOSIS: same ? ?PROCEDURE: right PMM and CP ? ?ANESTHESIA: general ? ?Velna Ochs ? ? ?Dictation #:  [id number]  ?

## 2021-06-24 NOTE — Interval H&P Note (Signed)
History and Physical Interval Note: ? ?06/24/2021 ?2:03 PM ? ?Bethany Beard  has presented today for surgery, with the diagnosis of RIGHT KNEE MEDIAL MENISCUS TEAR AND CHONDROMALACIA.  The various methods of treatment have been discussed with the patient and family. After consideration of risks, benefits and other options for treatment, the patient has consented to  Procedure(s): ?RIGHT KNEE ARTHROSCOPY (Right) as a surgical intervention.  The patient's history has been reviewed, patient examined, no change in status, stable for surgery.  I have reviewed the patient's chart and labs.  Questions were answered to the patient's satisfaction.   ? ? ?Lubertha Basque Remie Mathison ? ? ?

## 2021-06-24 NOTE — Transfer of Care (Signed)
Immediate Anesthesia Transfer of Care Note ? ?Patient: Rosio Cuthbert ? ?Procedure(s) Performed: RIGHT KNEE ARTHROSCOPY (Right: Knee) ? ?Patient Location: PACU ? ?Anesthesia Type:General ? ?Level of Consciousness: sedated ? ?Airway & Oxygen Therapy: Patient Spontanous Breathing and Patient connected to face mask oxygen ? ?Post-op Assessment: Report given to RN and Post -op Vital signs reviewed and stable ? ?Post vital signs: Reviewed and stable ? ?Last Vitals:  ?Vitals Value Taken Time  ?BP    ?Temp    ?Pulse 79 06/24/21 1546  ?Resp 16 06/24/21 1546  ?SpO2 80 % 06/24/21 1546  ?Vitals shown include unvalidated device data. ? ?Last Pain:  ?Vitals:  ? 06/24/21 1235  ?TempSrc:   ?PainSc: 0-No pain  ?   ? ?  ? ?Complications: No notable events documented. ?

## 2021-06-24 NOTE — Anesthesia Preprocedure Evaluation (Addendum)
Anesthesia Evaluation  ?Patient identified by MRN, date of birth, ID band ?Patient awake ? ? ? ?Reviewed: ?Allergy & Precautions, NPO status , Patient's Chart, lab work & pertinent test results ? ?Airway ?Mallampati: II ? ?TM Distance: >3 FB ?Neck ROM: Full ? ? ? Dental ?no notable dental hx. ?(+)  ?  ?Pulmonary ?asthma , former smoker,  ?  ?Pulmonary exam normal ?breath sounds clear to auscultation ? ? ? ? ? ? Cardiovascular ?hypertension, Pt. on home beta blockers and Pt. on medications ?+ angina + CAD  ?Normal cardiovascular exam ?Rhythm:Regular Rate:Normal ? ?TTE 2022 ??1. Left ventricular ejection fraction, by estimation, is 60 to 65%. The  ?left ventricle has normal function. The left ventricle has no regional  ?wall motion abnormalities. Left ventricular diastolic parameters were  ?normal. Elevated left ventricular  ?end-diastolic pressure.  ??2. Right ventricular systolic function is normal. The right ventricular  ?size is normal.  ??3. The mitral valve is normal in structure. Mild mitral valve  ?regurgitation. No evidence of mitral stenosis.  ??4. The aortic valve is tricuspid. Aortic valve regurgitation is mild.  ?Mild to moderate aortic valve sclerosis/calcification is present, without  ?any evidence of aortic stenosis. Aortic regurgitation PHT measures 487  ?msec.  ??5. The inferior vena cava is dilated in size with >50% respiratory  ?variability, suggesting right atrial pressure of 8 mmHg. ? ?LHC 2022 ?1. Severe heavily calcified proximal and mid RCA stenosis. Probable chronic occlusion of a posterolateral branch that is seen to fill from left to right collaterals. The PDA has competitive flow from left to right collaterals.  ?2. Mild eccentric distal left main stenosis ?3. Mild ostial LAD stenosis. The remainder of the LAD has no significant disease.  ?4. Severe proximal Circumflex stenosis. Moderately severe stenosis of the ostium of the moderate caliber obtuse  marginal branch. Severe mid Circumflex stenosis.  ?5. Elevated LVEDP ? ?  ?Neuro/Psych ? Headaches, PSYCHIATRIC DISORDERS Depression   ? GI/Hepatic ?Neg liver ROS, GERD  ,  ?Endo/Other  ?Hypothyroidism Morbid obesity (BMI 42) ? Renal/GU ?negative Renal ROS  ?negative genitourinary ?  ?Musculoskeletal ?negative musculoskeletal ROS ?(+)  ? Abdominal ?  ?Peds ? Hematology ? ?(+) Blood dyscrasia (on plavix), ,   ?Anesthesia Other Findings ? ? Reproductive/Obstetrics ? ?  ? ? ? ? ? ? ? ? ? ? ? ? ? ?  ?  ? ? ? ? ? ? ? ?Anesthesia Physical ?Anesthesia Plan ? ?ASA: 3 ? ?Anesthesia Plan: General  ? ?Post-op Pain Management: Tylenol PO (pre-op)*  ? ?Induction: Intravenous ? ?PONV Risk Score and Plan: 3 and Ondansetron, Dexamethasone and Midazolam ? ?Airway Management Planned: LMA ? ?Additional Equipment:  ? ?Intra-op Plan:  ? ?Post-operative Plan: Extubation in OR ? ?Informed Consent: I have reviewed the patients History and Physical, chart, labs and discussed the procedure including the risks, benefits and alternatives for the proposed anesthesia with the patient or authorized representative who has indicated his/her understanding and acceptance.  ? ? ? ?Dental advisory given ? ?Plan Discussed with: CRNA ? ?Anesthesia Plan Comments:   ? ? ? ? ? ? ?Anesthesia Quick Evaluation ? ?

## 2021-06-24 NOTE — Progress Notes (Signed)
Dr.Daldorf's PA made aware that there were no orders in epic.  He states he will take care of it.   ?

## 2021-06-24 NOTE — Anesthesia Procedure Notes (Signed)
Procedure Name: LMA Insertion ?Date/Time: 06/24/2021 2:48 PM ?Performed by: Lind Covert, CRNA ?Pre-anesthesia Checklist: Patient identified, Emergency Drugs available, Suction available and Patient being monitored ?Patient Re-evaluated:Patient Re-evaluated prior to induction ?Oxygen Delivery Method: Circle system utilized ?Induction Type: IV induction ?LMA: LMA inserted ?LMA Size: 4.0 ?Tube type: Oral ?Number of attempts: 1 ?Placement Confirmation: positive ETCO2 and breath sounds checked- equal and bilateral ?Tube secured with: Tape ?Dental Injury: Teeth and Oropharynx as per pre-operative assessment  ? ? ? ? ?

## 2021-06-24 NOTE — Op Note (Signed)
NAME: Bethany Beard, Bethany Beard ?MEDICAL RECORD NO: [id number] ?ACCOUNT NO: [id number] ?DATE OF BIRTH: Dec 26, 1960 ?FACILITY: [facility] ?LOCATION: [facility] ?PHYSICIAN: Lubertha Basque. Jerl Santos, MD ? ?Operative Report  ? ?DATE OF PROCEDURE: 06/24/2021 ? ?PREOPERATIVE DIAGNOSES:   ?1.  Right knee torn medial meniscus. ?2.  Right knee chondromalacia patella. ? ?POSTOPERATIVE DIAGNOSES:   ?1.  Right knee torn medial meniscus. ?2.  Right knee chondromalacia patella. ? ?PROCEDURE:  ?1.  Right knee partial medial meniscectomy. ?2.  Right knee abrasion chondroplasty of patellofemoral. ?3.  Right knee removal scar tissue. ? ?ANESTHESIA:  General. ? ?ATTENDING SURGEON:  Lubertha Basque. Jerl Santos, MD. ? ?ASSISTANT:  Elodia Florence, PA. ? ?INDICATIONS FOR PROCEDURE:  The patient is a 61 year old woman with a recent trauma to her knee and increased discomfort and stiffness.  By MRI scan, she has some chondromalacia and a torn medial meniscus.  She has pain with motion and pressure on her  ?knee and is offered an arthroscopy.  Informed operative consent was obtained after discussion of possible complications including, reaction to anesthesia and infection. ? ?SUMMARY OF FINDINGS AND PROCEDURE:  Under general anesthesia, an arthroscopy of the right knee was performed.  Suprapatellar pouch was benign while the patellofemoral joint exhibited some grade 3 and focal grade 4 change addressed with chondroplasty and  ?abrasion of bleeding bone in one small area.  In the medial compartment, she had a small posterior horn medial meniscus tear addressed with a brief partial medial meniscectomy.  She had no degenerative changes in this compartment. She had a large  ?collection of scar tissue in the notch anterior to the ACL.  This obscured the ACL and both compartments actually.  We removed this with a shaver and eventually the underlying ACL and PCL could be seen to be intact.  Lateral compartment was benign with  ?no evidence of meniscal or articular cartilage injury.  The  patient was scheduled to go home same day. ? ?DESCRIPTION OF PROCEDURE:  The patient was taken to the operating suite where general anesthetic was applied without difficulty.  She was positioned supine and prepped and draped in normal sterile fashion.  After the administration of preoperative IV  ?Kefzol and appropriate time-out, an arthroscopy of the right knee was performed through a total of 2 portals.  Findings were as noted above and the procedure consisted of the partial medial meniscectomy done simply with a shaver, removing a very small  ?portion of the structure.  We then performed removal of the scar tissue with a shaver and then the chondroplasty of patellofemoral, which did include abrasion of bleeding bone in one small area.  The knee was thoroughly irrigated, followed by placement  ?of Marcaine with epinephrine and morphine.  Adaptic was placed on the portals followed by dry gauze and a Coban wrap.  Estimated blood loss and intraoperative fluids can be obtained from anesthesia records. ? ?DISPOSITION:  The patient was extubated in the operating room and taken to recovery room in stable condition.  Plans were for her to go home same day and follow up in the office in less than a week.  I will contact her by phone tonight. ? ? ? ? ?PAA ?D: 06/24/2021 3:42:14 pm T: 06/24/2021 11:05:00 pm  ?JOB: [id number]/ [id number]  ?

## 2021-06-25 ENCOUNTER — Encounter (HOSPITAL_COMMUNITY): Payer: Self-pay | Admitting: Orthopaedic Surgery

## 2021-06-25 NOTE — Anesthesia Postprocedure Evaluation (Signed)
Anesthesia Post Note ? ?Patient: Bethany Beard ? ?Procedure(s) Performed: RIGHT KNEE ARTHROSCOPY (Right: Knee) ? ?  ? ?Patient location during evaluation: PACU ?Anesthesia Type: General ?Level of consciousness: awake and alert ?Pain management: pain level controlled ?Vital Signs Assessment: post-procedure vital signs reviewed and stable ?Respiratory status: spontaneous breathing, nonlabored ventilation, respiratory function stable and patient connected to nasal cannula oxygen ?Cardiovascular status: blood pressure returned to baseline and stable ?Postop Assessment: no apparent nausea or vomiting ?Anesthetic complications: no ? ? ?No notable events documented. ? ?Last Vitals:  ?Vitals:  ? 06/24/21 1730 06/24/21 1732  ?BP:  (!) 142/72  ?Pulse:  (!) 51  ?Resp:  14  ?Temp:    ?SpO2: 90% 93%  ?  ?Last Pain:  ?Vitals:  ? 06/24/21 1720  ?TempSrc:   ?PainSc: 0-No pain  ? ? ?  ?  ?  ?  ?  ?  ? ?Bethany Beard L Jalik Gellatly ? ? ? ? ?

## 2021-06-26 ENCOUNTER — Telehealth: Payer: Self-pay | Admitting: Pulmonary Disease

## 2021-06-26 MED ORDER — FLUTICASONE FUROATE-VILANTEROL 200-25 MCG/ACT IN AEPB: 1.0000 | INHALATION_SPRAY | Freq: Every day | RESPIRATORY_TRACT | 3 refills | Status: DC

## 2021-06-26 NOTE — Telephone Encounter (Signed)
Was on hold for 15 mins with [facility]. But I did refill the prescription for the [facility]. Nothing further needed  ?

## 2021-07-22 ENCOUNTER — Encounter: Payer: Self-pay | Admitting: Pulmonary Disease

## 2021-07-22 ENCOUNTER — Encounter: Payer: Self-pay | Admitting: Cardiovascular Disease

## 2021-07-22 MED ORDER — DAPAGLIFLOZIN PROPANEDIOL 10 MG PO TABS: 10.0000 mg | ORAL_TABLET | Freq: Every day | ORAL | 3 refills | Status: DC

## 2021-07-23 MED ORDER — FLUTICASONE FUROATE-VILANTEROL 200-25 MCG/ACT IN AEPB: 1.0000 | INHALATION_SPRAY | Freq: Every day | RESPIRATORY_TRACT | 3 refills | Status: DC

## 2021-08-21 ENCOUNTER — Other Ambulatory Visit: Payer: Self-pay | Admitting: Family Medicine

## 2021-09-18 ENCOUNTER — Other Ambulatory Visit: Payer: Self-pay | Admitting: Cardiovascular Disease

## 2021-09-23 ENCOUNTER — Ambulatory Visit (INDEPENDENT_AMBULATORY_CARE_PROVIDER_SITE_OTHER): Payer: BC Managed Care – PPO | Admitting: Family Medicine

## 2021-09-23 ENCOUNTER — Encounter: Payer: Self-pay | Admitting: Family Medicine

## 2021-09-23 ENCOUNTER — Other Ambulatory Visit (HOSPITAL_COMMUNITY)
Admission: RE | Admit: 2021-09-23 | Discharge: 2021-09-23 | Disposition: A | Discharge status: Home / Self Care | Payer: BC Managed Care – PPO | Source: Ambulatory Visit | Attending: Family Medicine | Admitting: Family Medicine

## 2021-09-23 ENCOUNTER — Encounter: Payer: Self-pay | Admitting: *Deleted

## 2021-09-23 VITALS — BP 132/85 | HR 58 | Ht 67.0 in | Wt 267.0 lb

## 2021-09-23 DIAGNOSIS — Z124 Encounter for screening for malignant neoplasm of cervix: Secondary | ICD-10-CM

## 2021-09-23 DIAGNOSIS — N9089 Other specified noninflammatory disorders of vulva and perineum: Secondary | ICD-10-CM | POA: Diagnosis not present

## 2021-09-23 DIAGNOSIS — N898 Other specified noninflammatory disorders of vagina: Secondary | ICD-10-CM

## 2021-09-23 LAB — POCT WET PREP (WET MOUNT): Clue Cells Wet Prep Whiff POC: POSITIVE

## 2021-09-23 MED ORDER — CITALOPRAM HYDROBROMIDE 20 MG PO TABS: 20.0000 mg | ORAL_TABLET | Freq: Every day | ORAL | 0 refills | Status: DC

## 2021-09-23 MED ORDER — NYSTATIN 100000 UNIT/GM EX CREA: 1.0000 "application " | TOPICAL_CREAM | Freq: Four times a day (QID) | CUTANEOUS | 1 refills | Status: AC

## 2021-09-23 MED ORDER — METRONIDAZOLE 500 MG PO TABS: 500.0000 mg | ORAL_TABLET | Freq: Two times a day (BID) | ORAL | 0 refills | Status: AC

## 2021-09-23 NOTE — Patient Instructions (Signed)
You were found to be positive for trichomoniasis, I am sending in an antibiotic that you will take twice a day for 7 days for treatment.   For your itching, I am sending in nystatin cream that you can use and also referring you to the gynecologist to see if there needs to be any further work-up for that area.   Your pap smear will take a few days to come back.  Make an appointment with your PCP to discuss the skin lesion you are concerned about and possible trazodone.

## 2021-09-23 NOTE — Progress Notes (Signed)
    SUBJECTIVE:   CHIEF COMPLAINT / HPI:   Medication refill Patient needing refill of her Celexa. She would also like to discuss possibly restarting Trazodone as she was on it previously for her sleep and had great benefit from it.  Vaginal irritation/pruritis Patient reports about >1 year of vaginal irritation with itching that has progressively gotten worse. She has always had issues with impetigo and uses lotion and other measures to prevent build-up in her skin folds. 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 Denies any bleeding from the area.   PERTINENT  PMH / PSH: Reviewed  OBJECTIVE:   BP 132/85   Pulse (!) 58   Ht 5\' 7"  (1.702 m)   Wt 267 lb (121.1 kg)   SpO2 98%   BMI 41.82 kg/m   General: NAD, well-appearing, well-nourished Respiratory: No respiratory distress, breathing comfortably, able to speak in full sentences Skin: warm and dry, no rashes noted on exposed skin Psych: Appropriate affect and mood Pelvic exam: VULVA: vulvar erythema on bilateral sides, vulvar lesion on the left labia with small round area on superior surface with more increased erythematous area at the center of the lesion - pruritic to touch, VAGINA: vaginal discharge - creamy and grey, CERVIX: normal appearing cervix without discharge or lesions, PAP: Pap smear done today, WET MOUNT done - results: trichomonads, exam chaperoned by , CMA.  ASSESSMENT/PLAN:   Vulvar lesion Lesion of unclear etiology. Patient with other signs of intertrigo and will trial nystatin cream of the area. Differential includes lichen sclerosus so will need to have patient evaluated by gynecology to determine if biopsy would be appropriate. Consideration for trial of steroid cream, but given possible fungal infection will want to treat that first before using steroids. Also must consider that  patient's constant use of pads may contribute to current and chronic irritation.  - Nystatin cream - Referral to gynecology   Pap smear Completed today.   Trichomoniasis Wet-prep was positive for trichomoniasis. Patient's last sexual intercourse was some time ago with only 1 partner in the last few years. Discharge could be contributing to patient's itching. - Metronidazle 500mg  BID x7 days  Depression  Insomnia - Refilled Citalopram - follow-up with PCP regarding Trazodone discussion   Feliz Beam, DO [facility]

## 2021-09-24 LAB — CYTOLOGY - PAP
Adequacy: ABSENT
Chlamydia: NEGATIVE
Comment: NEGATIVE
Comment: NEGATIVE
Comment: NORMAL
Diagnosis: NEGATIVE
High risk HPV: NEGATIVE
Neisseria Gonorrhea: NEGATIVE

## 2021-10-01 ENCOUNTER — Encounter: Payer: Self-pay | Admitting: Radiology

## 2021-10-01 ENCOUNTER — Ambulatory Visit (INDEPENDENT_AMBULATORY_CARE_PROVIDER_SITE_OTHER): Payer: BC Managed Care – PPO | Admitting: Radiology

## 2021-10-01 VITALS — BP 128/84 | Ht 66.0 in | Wt 269.0 lb

## 2021-10-01 DIAGNOSIS — N904 Leukoplakia of vulva: Secondary | ICD-10-CM | POA: Diagnosis not present

## 2021-10-01 MED ORDER — CLOBETASOL PROPIONATE 0.05 % EX OINT: 1.0000 "application " | TOPICAL_OINTMENT | Freq: Two times a day (BID) | CUTANEOUS | 0 refills | Status: DC

## 2021-10-01 NOTE — Progress Notes (Signed)
   Bethany Beard 1960-08-31 [id number]   History: Postmenopausal 61 y.o. presents for evaluation of white vulvar lesion. She was referred by PCP who treated pt with nystatin for vulvar itching and trichomonas was found on wet prep. She was treated for that on the day of visit. Has not been sexually active in about 1 year.   Gynecologic History Postmenopausal Last Pap: 09/23/21. Results were: normal, HPV testing not completed Last mammogram: 10/22 Colonoscopy: 2019 HRT use: never  Obstetric History OB History  Gravida Para Term Preterm AB Living  2 1     1 1   SAB IAB Ectopic Multiple Live Births    1     1    # Outcome Date GA Lbr Len/2nd Weight Sex Delivery Anes PTL Lv  2 IAB           1 Para              The following portions of the patient's history were reviewed and updated as appropriate: allergies, current medications, past family history, past medical history, past social history, past surgical history, and problem list.  Review of Systems Pertinent items noted in HPI and remainder of comprehensive ROS otherwise negative.  Past medical history, past surgical history, family history and social history were all reviewed and documented in the EPIC chart.  Exam:  Vitals:   10/01/21 1452  BP: 128/84  Weight: 269 lb (122 kg)  Height: 5\' 6"  (1.676 m)   Body mass index is 43.42 kg/m.  General appearance:  Normal Thyroid:  Symmetrical, normal in size, without palpable masses or nodularity. Respiratory  Auscultation:  Clear without wheezing or rhonchi Cardiovascular  Auscultation:  Regular rate, without rubs, murmurs or gallops  Edema/varicosities:  Not grossly evident Abdominal  Soft,nontender, without masses, guarding or rebound.  Liver/spleen:  No organomegaly noted  Hernia:  None appreciated  Skin  Inspection:  Grossly normal Breasts: Examined lying and sitting.   Right: Without masses, retractions, nipple discharge or axillary adenopathy.   Left: Without  masses, retractions, nipple discharge or axillary adenopathy. Genitourinary   Inguinal/mons:  Normal without inguinal adenopathy  External genitalia:  Normal appearing vulva with no masses, tenderness, or lesions  BUS/Urethra/Skene's glands:  Normal  Vagina:  White patch 1cm at introitus with thinning skin. No lesions. C/w lichen sclerosius      Patient informed chaperone available to be present for breast and pelvic exam. Patient has requested no chaperone to be present. Patient has been advised what will be completed during breast and pelvic exam.   Assessment/Plan:   1. Lichen sclerosus of vulva Clobetasol BID x 14 days. If no improvement will biopsy Follow up 3 months, may need TOC trich at that visit if not completed at PCP    Discussed SBE, colonoscopy and DEXA screening as directed. Recommend 135mins of exercise weekly, including weight bearing exercise. Encouraged the use of seatbelts and sunscreen.  Return in 1 year for annual or sooner prn.  Bridget [place] B WHNP-BC, 3:12 PM 10/01/2021

## 2021-10-19 ENCOUNTER — Other Ambulatory Visit: Payer: Self-pay | Admitting: Family Medicine

## 2021-10-23 ENCOUNTER — Other Ambulatory Visit: Payer: Self-pay

## 2021-10-24 MED ORDER — LEVOTHYROXINE SODIUM 100 MCG PO TABS: 100.0000 ug | ORAL_TABLET | ORAL | 3 refills | Status: DC

## 2021-10-27 ENCOUNTER — Encounter: Payer: Self-pay | Admitting: Cardiovascular Disease

## 2021-10-27 MED ORDER — METOPROLOL SUCCINATE ER 25 MG PO TB24: 25.0000 mg | ORAL_TABLET | Freq: Every day | ORAL | 1 refills | Status: DC

## 2021-10-29 NOTE — Progress Notes (Unsigned)
Cardiology Office Note:   Date:  10/30/2021  NAME:  Bethany Beard    MRN: [id number] DOB:  1960/05/09   PCP:  Reece Leader, DO  Cardiologist:  Reatha Harps, MD  Electrophysiologist:  None   Referring MD: Reece Leader, DO   Chief Complaint  Patient presents with   Follow-up        History of Present Illness:   Bethany Beard is a 61 y.o. female with a hx of CAD, hypertension, hyperlipidemia, tobacco abuse, PVCs who presents for follow-up.  She reports she is doing well.  Her blood pressure is well controlled.  No symptoms of angina.  No palpitations.  Cholesterol level not at goal.  We discussed rechecking.  If continues to be elevated we would recommend Repatha.  No palpitations.  Overall doing well.  Denies any symptoms of heart failure.  Problem List HTN Tobacco abuse Asthma HLD -T chol 169, HDL 72, LDL 80, triglycerides 95 5. Obstructive CAD -99% mid RCA, 100% dRCA with L to R collaterals -95% dLCX (Small) -40% LM -50% pLAD -medical management recommended  6. HFpEF -LVEDP 36 mmHG [facility] 12/11/2020 7. PVCs -16.7% burden 8. PACs -4.2% burden   Past Medical History: Past Medical History:  Diagnosis Date   Anginal pain (HCC)    Arthritis    Asthma    Coronary artery disease    Depression    Dyspnea    Hyperlipidemia    Hypertension    Hypothyroidism    Migraines    Pneumonia    Thyroid disease     Past Surgical History: Past Surgical History:  Procedure Laterality Date   BACK SURGERY     carpel     CHOLECYSTECTOMY     cyst remove Left    KNEE ARTHROSCOPY Right 06/24/2021   Procedure: RIGHT KNEE ARTHROSCOPY;  Surgeon: Marcene Corning, MD;  Location: [facility];  Service: Orthopedics;  Laterality: Right;   LEFT HEART CATH AND CORONARY ANGIOGRAPHY N/A 12/11/2020   Procedure: LEFT HEART CATH AND CORONARY ANGIOGRAPHY;  Surgeon: Kathleene Hazel, MD;  Location: [facility] INVASIVE CV LAB;  Service: Cardiovascular;  Laterality: N/A;   TONSILLECTOMY      Current  Medications: No outpatient medications have been marked as taking for the 10/30/21 encounter (Office Visit) with Flora Lipps, Ronnald Ramp, MD.     Allergies:    Avocado, Codeine, Other, Strawberry extract, and Tomato   Social History: Social History   Socioeconomic History   Marital status: Widowed    Spouse name: Not on file   Number of children: 1   Years of education: Not on file   Highest education level: Not on file  Occupational History   Occupation: [employer]  Tobacco Use   Smoking status: Former    Packs/day: 1.00    Years: 45.00    Total pack years: 45.00    Types: Cigarettes    Quit date: 10/2020    Years since quitting: 1.0   Smokeless tobacco: Never  Vaping Use   Vaping Use: Never used  Substance and Sexual Activity   Alcohol use: Not Currently   Drug use: Yes    Types: Marijuana   Sexual activity: Never  Other Topics Concern   Not on file  Social History Narrative   Not on file   Social Determinants of Health   Financial Resource Strain: Not on file  Food Insecurity: Not on file  Transportation Needs: Not on file  Physical Activity: Not on file  Stress:  Not on file  Social Connections: Not on file     Family History: The patient's family history is negative for Cancer, Diabetes, Hyperlipidemia, Hypertension, Kidney disease, and Stroke. She was adopted.  ROS:   All other ROS reviewed and negative. Pertinent positives noted in the HPI.     EKGs/Labs/Other Studies Reviewed:   The following studies were personally reviewed by me today:  LHC 12/11/2020 Severe heavily calcified proximal and mid RCA stenosis. Probable chronic occlusion of a posterolateral branch that is seen to fill from left to right collaterals. The PDA has competitive flow from left to right collaterals.  Mild eccentric distal left main stenosis Mild ostial LAD stenosis. The remainder of the LAD has no significant disease.  Severe proximal Circumflex stenosis. Moderately severe  stenosis of the ostium of the moderate caliber obtuse marginal branch. Severe mid Circumflex stenosis.  Elevated LVEDP  TTE 12/04/2020  1. Left ventricular ejection fraction, by estimation, is 60 to 65%. The  left ventricle has normal function. The left ventricle has no regional  wall motion abnormalities. Left ventricular diastolic parameters were  normal. Elevated left ventricular  end-diastolic pressure.   2. Right ventricular systolic function is normal. The right ventricular  size is normal.   3. The mitral valve is normal in structure. Mild mitral valve  regurgitation. No evidence of mitral stenosis.   4. The aortic valve is tricuspid. Aortic valve regurgitation is mild.  Mild to moderate aortic valve sclerosis/calcification is present, without  any evidence of aortic stenosis. Aortic regurgitation PHT measures 487  msec.   5. The inferior vena cava is dilated in size with >50% respiratory  variability, suggesting right atrial pressure of 8 mmHg.    Recent Labs: 03/31/2021: TSH 3.180 06/03/2021: BUN 24; Creatinine, Ser 0.86; Hemoglobin 10.6; Platelets 233; Potassium 4.2; Sodium 138   Recent Lipid Panel    Component Value Date/Time   CHOL 169 03/31/2021 0925   TRIG 95 03/31/2021 0925   HDL 72 03/31/2021 0925   CHOLHDL 2.3 03/31/2021 0925   CHOLHDL 4 01/04/2012 1530   VLDL 24.8 01/04/2012 1530   LDLCALC 80 03/31/2021 0925   LDLDIRECT 207.2 01/04/2012 1530    Physical Exam:   VS:  BP 134/72   Pulse (!) 56   Ht 5\' 6"  (1.676 m)   Wt 268 lb 9.6 oz (121.8 kg)   SpO2 95%   BMI 43.35 kg/m    Wt Readings from Last 3 Encounters:  10/30/21 268 lb 9.6 oz (121.8 kg)  10/01/21 269 lb (122 kg)  09/23/21 267 lb (121.1 kg)    General: Well nourished, well developed, in no acute distress Head: Atraumatic, normal size  Eyes: PEERLA, EOMI  Neck: Supple, no JVD Endocrine: No thryomegaly Cardiac: Normal S1, S2; RRR; no murmurs, rubs, or gallops Lungs: Clear to auscultation  bilaterally, no wheezing, rhonchi or rales  Abd: Soft, nontender, no hepatomegaly  Ext: No edema, pulses 2+ Musculoskeletal: No deformities, BUE and BLE strength normal and equal Skin: Warm and dry, no rashes   Neuro: Alert and oriented to person, place, time, and situation, CNII-XII grossly intact, no focal deficits  Psych: Normal mood and affect   ASSESSMENT:   Bethany Beard is a 61 y.o. female who presents for the following: 1. Coronary artery disease of native artery of native heart with stable angina pectoris (HCC)   2. Mixed hyperlipidemia   3. Primary hypertension   4. Chronic diastolic heart failure (HCC)   5. PVC (premature ventricular contraction)  6. Chronic bronchitis, unspecified chronic bronchitis type (HCC)     PLAN:   1. Coronary artery disease of native artery of native heart with stable angina pectoris (HCC) 2. Mixed hyperlipidemia -Diffuse obstructive CAD.  Best option is medical management.  Echo was normal.  Continue aspirin Plavix and Lipitor.  LDL not at goal.  Plan to recheck this in the next week or so.  If value is still above 70 would recommend Repatha.  She understands this.  Her blood pressure is well controlled her vitals are well controlled.  She is doing well without angina.  We will continue amlodipine 10 mg daily, Imdur 30 mg daily, metoprolol succinate 25 mg daily.  3. Primary hypertension -Well-controlled on amlodipine 10 mg daily, metoprolol succinate 25 mg daily, Imdur 30 mg daily, losartan 25 mg daily.  On Lasix and Farxiga as well.  4. Chronic diastolic heart failure (HCC) -Euvolemic on exam.  Continue blood pressure medications.  On Farxiga 10 mg daily.  Also taking Lasix 20 mg daily with potassium supplement.  5. PVC (premature ventricular contraction) -Frequent PVC burden.  Echo was normal.  No symptoms.  Continue with metoprolol for now.  6. Chronic bronchitis, unspecified chronic bronchitis type (HCC) -Refill Breo.  Disposition: Return  in about 6 months (around 05/02/2022).  Medication Adjustments/Labs and Tests Ordered: Current medicines are reviewed at length with the patient today.  Concerns regarding medicines are outlined above.  Orders Placed This Encounter  Procedures   Lipid panel   Meds ordered this encounter  Medications   fluticasone furoate-vilanterol (BREO ELLIPTA) 200-25 MCG/ACT AEPB    Sig: Inhale 1 puff into the lungs daily.    Dispense:  180 each    Refill:  0    Patient Instructions  Medication Instructions:  The current medical regimen is effective;  continue present plan and medications.  *If you need a refill on your cardiac medications before your next appointment, please call your pharmacy*   Lab Work: LIPID (come back fasting- nothing to eat or drink, no lab appointment needed)   If you have labs (blood work) drawn today and your tests are completely normal, you will receive your results only by: MyChart Message (if you have MyChart) OR A paper copy in the mail If you have any lab test that is abnormal or we need to change your treatment, we will call you to review the results.   Follow-Up: At [facility], you and your health needs are our priority.  As part of our continuing mission to provide you with exceptional heart care, we have created designated Provider Care Teams.  These Care Teams include your primary Cardiologist (physician) and Advanced Practice Providers (APPs -  Physician Assistants and Nurse Practitioners) who all work together to provide you with the care you need, when you need it.  We recommend signing up for the patient portal called "MyChart".  Sign up information is provided on this After Visit Summary.  MyChart is used to connect with patients for Virtual Visits (Telemedicine).  Patients are able to view lab/test results, encounter notes, upcoming appointments, etc.  Non-urgent messages can be sent to your provider as well.   To learn more about what you can do  with MyChart, go to [email or web address].    Your next appointment:   6 month(s)  The format for your next appointment:   In Person  Provider:   Reatha Harps, MD  Time Spent with Patient: I have spent a total of 35 minutes with patient reviewing hospital notes, telemetry, EKGs, labs and examining the patient as well as establishing an assessment and plan that was discussed with the patient.  > 50% of time was spent in direct patient care.  Signed, Lenna Gilford. Flora Lipps, MD, [facility]  [facility]  [address] [place], [place] [address] [phone]  10/30/2021 1:36 PM

## 2021-10-30 ENCOUNTER — Ambulatory Visit (INDEPENDENT_AMBULATORY_CARE_PROVIDER_SITE_OTHER): Payer: BC Managed Care – PPO | Admitting: Cardiovascular Disease

## 2021-10-30 ENCOUNTER — Encounter: Payer: Self-pay | Admitting: Cardiovascular Disease

## 2021-10-30 VITALS — BP 134/72 | HR 56 | Ht 66.0 in | Wt 268.6 lb

## 2021-10-30 DIAGNOSIS — I25118 Atherosclerotic heart disease of native coronary artery with other forms of angina pectoris: Secondary | ICD-10-CM

## 2021-10-30 DIAGNOSIS — E782 Mixed hyperlipidemia: Secondary | ICD-10-CM | POA: Diagnosis not present

## 2021-10-30 DIAGNOSIS — I5032 Chronic diastolic (congestive) heart failure: Secondary | ICD-10-CM | POA: Diagnosis not present

## 2021-10-30 DIAGNOSIS — I10 Essential (primary) hypertension: Secondary | ICD-10-CM

## 2021-10-30 DIAGNOSIS — J42 Unspecified chronic bronchitis: Secondary | ICD-10-CM

## 2021-10-30 DIAGNOSIS — I493 Ventricular premature depolarization: Secondary | ICD-10-CM

## 2021-10-30 MED ORDER — FLUTICASONE FUROATE-VILANTEROL 200-25 MCG/ACT IN AEPB: 1.0000 | INHALATION_SPRAY | Freq: Every day | RESPIRATORY_TRACT | 0 refills | Status: DC

## 2021-10-30 NOTE — Patient Instructions (Signed)
Medication Instructions:  The current medical regimen is effective;  continue present plan and medications.  *If you need a refill on your cardiac medications before your next appointment, please call your pharmacy*   Lab Work: LIPID (come back fasting- nothing to eat or drink, no lab appointment needed)   If you have labs (blood work) drawn today and your tests are completely normal, you will receive your results only by: MyChart Message (if you have MyChart) OR A paper copy in the mail If you have any lab test that is abnormal or we need to change your treatment, we will call you to review the results.   Follow-Up: At [facility], you and your health needs are our priority.  As part of our continuing mission to provide you with exceptional heart care, we have created designated Provider Care Teams.  These Care Teams include your primary Cardiologist (physician) and Advanced Practice Providers (APPs -  Physician Assistants and Nurse Practitioners) who all work together to provide you with the care you need, when you need it.  We recommend signing up for the patient portal called "MyChart".  Sign up information is provided on this After Visit Summary.  MyChart is used to connect with patients for Virtual Visits (Telemedicine).  Patients are able to view lab/test results, encounter notes, upcoming appointments, etc.  Non-urgent messages can be sent to your provider as well.   To learn more about what you can do with MyChart, go to [email or web address].    Your next appointment:   6 month(s)  The format for your next appointment:   In Person  Provider:   Reatha Harps, MD

## 2021-11-04 ENCOUNTER — Encounter: Payer: Self-pay | Admitting: Cardiovascular Disease

## 2021-11-05 ENCOUNTER — Telehealth: Payer: Self-pay | Admitting: Cardiovascular Disease

## 2021-11-05 MED ORDER — FLUTICASONE FUROATE-VILANTEROL 200-25 MCG/ACT IN AEPB: 1.0000 | INHALATION_SPRAY | Freq: Every day | RESPIRATORY_TRACT | 1 refills | Status: DC

## 2021-11-05 NOTE — Telephone Encounter (Signed)
Pt c/o medication issue:  1. Name of Medication:   budesonide-formoterol (SYMBICORT) 160-4.5 MCG/ACT inhaler    2. How are you currently taking this medication (dosage and times per day)? SMARTSIG:2 Puff(s) By Mouth Twice Daily  3. Are you having a reaction (difficulty breathing--STAT)? NO  4. What is your medication issue? Pharmacy would like to know if pt is to continue medication being that pt was prescribed    fluticasone furoate-vilanterol (BREO ELLIPTA) 200-25 MCG/ACT AEPB  Please advise

## 2021-11-05 NOTE — Telephone Encounter (Signed)
Called and spoke with patient who confirms that she does not take symbicort. Med list updated and [facility] pharmacy made aware.

## 2021-11-07 ENCOUNTER — Encounter: Payer: Self-pay | Admitting: Family Medicine

## 2021-11-07 ENCOUNTER — Ambulatory Visit (INDEPENDENT_AMBULATORY_CARE_PROVIDER_SITE_OTHER): Payer: BC Managed Care – PPO | Admitting: Family Medicine

## 2021-11-07 VITALS — BP 136/62 | HR 51 | Ht 67.0 in | Wt 268.5 lb

## 2021-11-07 DIAGNOSIS — R4586 Emotional lability: Secondary | ICD-10-CM

## 2021-11-07 DIAGNOSIS — E039 Hypothyroidism, unspecified: Secondary | ICD-10-CM

## 2021-11-07 DIAGNOSIS — L989 Disorder of the skin and subcutaneous tissue, unspecified: Secondary | ICD-10-CM | POA: Diagnosis not present

## 2021-11-07 DIAGNOSIS — G4700 Insomnia, unspecified: Secondary | ICD-10-CM

## 2021-11-07 NOTE — Progress Notes (Addendum)
    SUBJECTIVE:   CHIEF COMPLAINT / HPI:   Bethany Beard is a 61 y.o. female who presents today for medication follow up, fatigue, and skin finding.  Hypothyroidism She has a longstanding history of hypothyroidism and has been on Synthroid intermittently for most of her life. Currently, she has been on this for a couple years at same dose. Last labs in 03/2021.  Fatigue Feeling more fatigued lately. Trouble both falling asleep and staying asleep. Trazodone in past to help with this; not on it currently.She does report a longstanding history of sleep issues, which she relates to mood changes. She is compliant with Celexa and reports feeling better on this.  Skin Lesion She reports an area on her upper mid-right back which stared with itching around a year ago; this area then became numb around 6 months ago, then a small growth happened in the area 2-3 months ago. She reports this was growing some in size, but that she was able to "scratch" some of it off. No other rashes, fever, chills.  Of note, she is followed by cardiology for her history of heart disease. Cardiology is managing her cholesterol medications, and she has an upcoming lipid panel scheduled through them.  PERTINENT  PMH / PSH: Asthma, GERD, hypothyroidism, HTN, HLD, CAD, depression  OBJECTIVE:   BP 136/62   Pulse (!) 51   Ht 5\' 7"  (1.702 m)   Wt 268 lb 8 oz (121.8 kg)   BMI 42.05 kg/m   General: Pt is well-appearing and seated comfortably in chair; no acute distress. HEENT: non-tender thyroid Cardiovascular: RRR, no murmurs, rubs, gallops. Pulmonary: Normal work of breathing. Lungs clear to auscultation bilaterally. Skin: ~35mm erythematous papule on upper right back without surrounding erythema, no pus/drainage, slight scale. See image below. Neuro/Psych: A&Ox3. Normal affect.    ASSESSMENT/PLAN:   Hypothyroidism Pt has a longstanding history of hypothyroidism managed on Synthroid. She has been on her current  dose, 100 mg daily, for the past few years. Last TSH 03/2021 WNL at 3.180. - We will continue to monitor with a repeat TSH and T4 at next OV in 6 months. - Continue current medication regimen.  Mood changes -stable and doing well on pharmacotherapy, will continue celexa 20 mg daily  -PHQ-9 score of 13 with negative question 9 reviewed and discussed -discussed coping techniques and utilizing support system in place -may consider initiating formal therapy sessions at next visit if patient is interested    Insomnia -Seems to be chronic and possibly related to ongoing mood changes, continue on celexa -hesitant to restart trazodone, extensively discussed sleep hygiene -follow up in 6 months                                Skin Lesion Pt has an erythematous papule on her right upper back, appears benign in nature with some concern for BCC. Image included in chart. -Will refer pt to derm clinic for further evaluation    04/2021, Medical Student [employer] [facility]    I was personally present and performed or re-performed the history, physical exam and medical decision making activities of this service and have verified that the service and findings are accurately documented in the student's note.  [facility], DO                  11/07/2021, 4:38 PM  PGY-3 [facility]

## 2021-11-07 NOTE — Patient Instructions (Signed)
It was great seeing you today!  We discussed many things today, you are not due for thyroid labs but we will get them next time. Please take your synthroid daily. I will wait until you see your cardiologist to start you on a statin, if not we can do this next time as well.  The skin condition looks benign but we will have you schedule an appointment with the dermatology clinic here at this clinic for a further evaluation.   Regarding sleep, I am hesitant to restart your trazodone. Instead, let's try some of the things we discussed below. Try to avoid screen time at least 30 minutes before bedtime as this light can interfere with our circadian rhythm.  Try to develop a bedtime routine. Take your celexa daily and utilize your support system as mood can affect sleep too.  Please follow up at your next scheduled appointment in 6 months, if anything arises between now and then, please don't hesitate to contact our office.   Thank you for allowing [place] to be a part of your medical care!  Thank you, Dr. Robyne Peers

## 2021-11-07 NOTE — Assessment & Plan Note (Signed)
-  Seems to be chronic and possibly related to ongoing mood changes, continue on celexa -hesitant to restart trazodone, extensively discussed sleep hygiene -follow up in 6 months

## 2021-11-07 NOTE — Assessment & Plan Note (Signed)
Pt has a longstanding history of hypothyroidism managed on Synthroid. She has been on her current dose, 100 mg daily, for the past few years. Last TSH 03/2021 WNL at 3.180. - We will continue to monitor with a repeat TSH and T4 at next OV in 6 months. - Continue current medication regimen.

## 2021-11-07 NOTE — Assessment & Plan Note (Addendum)
-  stable and doing well on pharmacotherapy, will continue celexa 20 mg daily  -PHQ-9 score of 13 with negative question 9 reviewed and discussed -discussed coping techniques and utilizing support system in place -may consider initiating formal therapy sessions at next visit if patient is interested

## 2021-11-11 ENCOUNTER — Other Ambulatory Visit: Payer: Self-pay | Admitting: Family Medicine

## 2021-11-11 DIAGNOSIS — E782 Mixed hyperlipidemia: Secondary | ICD-10-CM | POA: Diagnosis not present

## 2021-11-11 LAB — LIPID PANEL
Chol/HDL Ratio: 2.4 ratio (ref 0.0–4.4)
Cholesterol, Total: 191 mg/dL (ref 100–199)
HDL: 79 mg/dL (ref >39)
LDL Chol Calc (NIH): 93 mg/dL (ref 0–99)
Triglycerides: 108 mg/dL (ref 0–149)
VLDL Cholesterol Cal: 19 mg/dL (ref 5–40)

## 2021-11-12 ENCOUNTER — Other Ambulatory Visit: Payer: Self-pay

## 2021-11-12 DIAGNOSIS — E782 Mixed hyperlipidemia: Secondary | ICD-10-CM

## 2021-11-19 ENCOUNTER — Other Ambulatory Visit: Payer: Self-pay | Admitting: Cardiovascular Disease

## 2021-11-19 NOTE — Telephone Encounter (Signed)
Rx(s) sent to pharmacy electronically.  

## 2021-12-01 ENCOUNTER — Other Ambulatory Visit: Payer: Self-pay | Admitting: *Deleted

## 2021-12-01 DIAGNOSIS — J4540 Moderate persistent asthma, uncomplicated: Secondary | ICD-10-CM

## 2021-12-01 DIAGNOSIS — R0609 Other forms of dyspnea: Secondary | ICD-10-CM

## 2021-12-01 MED ORDER — MONTELUKAST SODIUM 10 MG PO TABS: 10.0000 mg | ORAL_TABLET | Freq: Every day | ORAL | 0 refills | Status: DC

## 2021-12-04 ENCOUNTER — Telehealth: Payer: Self-pay | Admitting: Pharmacist

## 2021-12-04 ENCOUNTER — Ambulatory Visit (INDEPENDENT_AMBULATORY_CARE_PROVIDER_SITE_OTHER): Payer: BC Managed Care – PPO | Admitting: Pharmacist

## 2021-12-04 DIAGNOSIS — I200 Unstable angina: Secondary | ICD-10-CM | POA: Diagnosis not present

## 2021-12-04 DIAGNOSIS — E7800 Pure hypercholesterolemia, unspecified: Secondary | ICD-10-CM | POA: Diagnosis not present

## 2021-12-04 NOTE — Progress Notes (Signed)
Patient ID: Bethany Beard                 DOB: [birth year]                    MRN: [id number]     HPI: Bethany Beard is a 61 y.o. female patient referred to lipid clinic by Dr Flora Lipps. [facility] is significant for CAD, unstable angina, HLD and history of smoking.    Patient presents today in good spirits.  Currently managed on atorvastatin 80mg  without any adverse effects.  LDL has reduced from 171 to 93 in past year but remains above goal.    Has obstructive CAD: -99% mid RCA, 100% dRCA with L to R collaterals -95% dLCX (Small) -40% LM -50% pLAD  Current Medications:  Atorvastatin 80mg  daily  Intolerances: N/A  Risk Factors:  CAD CHF HTN Smoking  LDL goal: <70  Labs: TC 191, Trigs 108, HDL 79, LDL 93 (11/11/21 on atorvastatin 80)  Past Medical History:  Diagnosis Date   Anginal pain (HCC)    Arthritis    Asthma    Coronary artery disease    Depression    Dyspnea    Hyperlipidemia    Hypertension    Hypothyroidism    Migraines    Pneumonia    Thyroid disease     Current Outpatient Medications on File Prior to Visit  Medication Sig Dispense Refill   acetaminophen (TYLENOL) 650 MG CR tablet Take 1,950 mg by mouth every 8 (eight) hours as needed for pain.     albuterol (VENTOLIN HFA) 108 (90 Base) MCG/ACT inhaler Inhale 2 puffs into the lungs every 6 (six) hours as needed for wheezing or shortness of breath (Asthma). 1 each 3   amLODipine (NORVASC) 10 MG tablet Take 10 mg by mouth daily.     aspirin EC 81 MG tablet Take 1 tablet (81 mg total) by mouth daily. Swallow whole. 90 tablet 3   atorvastatin (LIPITOR) 80 MG tablet Take 1 tablet (80 mg total) by mouth daily. 90 tablet 3   citalopram (CELEXA) 20 MG tablet Take 1 tablet by mouth once daily 30 tablet 0   clobetasol ointment (TEMOVATE) 0.05 % Apply 1 application  topically 2 (two) times daily. 30 g 0   clopidogrel (PLAVIX) 75 MG tablet Take 1 tablet by mouth once daily 90 tablet 1   dapagliflozin propanediol (FARXIGA) 10  MG TABS tablet Take 1 tablet (10 mg total) by mouth daily. 90 tablet 3   fluticasone furoate-vilanterol (BREO ELLIPTA) 200-25 MCG/ACT AEPB Inhale 1 puff into the lungs daily. 180 each 1   furosemide (LASIX) 20 MG tablet Take 20 mg by mouth daily.     isosorbide mononitrate (IMDUR) 30 MG 24 hr tablet Take 1 tablet by mouth once daily 90 tablet 3   levothyroxine (SYNTHROID) 100 MCG tablet Take 1 tablet (100 mcg total) by mouth every morning. 30 minutes before food 90 tablet 3   losartan (COZAAR) 25 MG tablet Take 25 mg by mouth daily.     metoprolol succinate (TOPROL XL) 25 MG 24 hr tablet Take 1 tablet (25 mg total) by mouth daily. 90 tablet 1   mometasone-formoterol (DULERA) 200-5 MCG/ACT AERO Inhale 2 puffs into the lungs in the morning and at bedtime. 1 each 6   montelukast (SINGULAIR) 10 MG tablet Take 1 tablet (10 mg total) by mouth at bedtime. 30 tablet 0   nitroGLYCERIN (NITROSTAT) 0.4 MG SL tablet Place 0.4 mg under the  tongue every 5 (five) minutes as needed for chest pain.     pantoprazole (PROTONIX) 40 MG tablet Take 1 tablet (40 mg total) by mouth daily. (Patient not taking: Reported on 11/07/2021) 30 tablet 2   potassium chloride (KLOR-CON) 10 MEQ tablet Take 10 mEq by mouth daily.     No current facility-administered medications on file prior to visit.    Allergies  Allergen Reactions   Avocado Other (See Comments)    Was told that she was allergic but has never tried them.    Codeine Itching and Swelling   Other Other (See Comments)    Environmental and cats   Strawberry Extract Itching and Rash   Tomato Itching and Rash    Assessment/Plan:  1. Hyperlipidemia - Patient's LDL 93 which is above goal of <70.  Recommended starting Repatha due to CAD.  Using demo pen, educated patient on mechanism of action, storage, site selection, administration, and possible adverse effects. Will complete PA and contact patient when approved.  Recheck lipid panel in 2-3 months. Advised  patient that some plans require trying Zetia first and will advise if that is the case.  Continue atorvastatin 80mg  daily Start Repatha 140mg  q 2 weeks Recheck lipid panel in 2-3 months  , PharmD, BCACP, CDCES, CPP [address] [place], [address], [place] Phone: [phone], Fax: [phone]

## 2021-12-04 NOTE — Patient Instructions (Addendum)
It was nice meeting you today  We would like your LDL (bad cholesterol) to be less than 70  Please continue your atorvastatin 80mg  once a day  We would like to start you on a new medication called Repatha which is an injection you will take once every 2 weeks  I will complete the prior authorization for you and contact you when it is approved  Once you start the medication we will recheck your cholesterol in 2-3 months  I encourage you to go to [email or web address] and sign up for a copay card  Please call or message with any questions  , PharmD, BCACP, CDCES, CPP [address] [place], [place], [place] Phone: [phone], Fax: [phone]

## 2021-12-04 NOTE — Telephone Encounter (Signed)
PA for Repatha submitted.  Key: [id number]

## 2021-12-09 ENCOUNTER — Other Ambulatory Visit: Payer: Self-pay | Admitting: Family Medicine

## 2021-12-13 ENCOUNTER — Encounter: Payer: Self-pay | Admitting: Cardiovascular Disease

## 2021-12-13 DIAGNOSIS — I200 Unstable angina: Secondary | ICD-10-CM

## 2021-12-13 DIAGNOSIS — E7800 Pure hypercholesterolemia, unspecified: Secondary | ICD-10-CM

## 2021-12-13 DIAGNOSIS — I25118 Atherosclerotic heart disease of native coronary artery with other forms of angina pectoris: Secondary | ICD-10-CM

## 2021-12-30 ENCOUNTER — Ambulatory Visit (INDEPENDENT_AMBULATORY_CARE_PROVIDER_SITE_OTHER): Payer: BC Managed Care – PPO | Admitting: Radiology

## 2021-12-30 ENCOUNTER — Encounter: Payer: Self-pay | Admitting: Radiology

## 2021-12-30 VITALS — BP 142/88 | Resp 16 | Ht 67.0 in | Wt 273.0 lb

## 2021-12-30 DIAGNOSIS — N904 Leukoplakia of vulva: Secondary | ICD-10-CM | POA: Diagnosis not present

## 2021-12-30 NOTE — Progress Notes (Signed)
   Bethany Beard 02/13/1961 [id number]   History: Postmenopausal 61 y.o. presents for follow up for lichen sclerosus. She was started on clobetasol and symptoms resolved. Was originally referred by PCP who treated pt with nystatin for vulvar itching and trichomonas was found on wet prep. She was treated for that on the day of visit, she reports has had a negative TOC for trich since our last visit.    Gynecologic History Postmenopausal Last Pap: 09/23/21. Results were: normal, HPV testing not completed Last mammogram: 10/22 Colonoscopy: 2019 HRT use: never  Obstetric History OB History  Gravida Para Term Preterm AB Living  2 1     1 1   SAB IAB Ectopic Multiple Live Births    1     1    # Outcome Date GA Lbr Len/2nd Weight Sex Delivery Anes PTL Lv  2 IAB           1 Para              The following portions of the patient's history were reviewed and updated as appropriate: allergies, current medications, past family history, past medical history, past social history, past surgical history, and problem list.  Review of Systems Pertinent items noted in HPI and remainder of comprehensive ROS otherwise negative.  Past medical history, past surgical history, family history and social history were all reviewed and documented in the EPIC chart.  Exam:  Vitals:   12/30/21 1000  BP: (!) 142/88  Resp: 16  Weight: 273 lb (123.8 kg)  Height: 5\' 7"  (1.702 m)   Body mass index is 42.76 kg/m.  General appearance:  Normal  Genitourinary   Inguinal/mons:  Normal without inguinal adenopathy  External genitalia:  Normal appearing vulva with no masses, tenderness, or lesions  BUS/Urethra/Skene's glands:  Normal  Vagina: Previous white patch 1cm at introitus with thinning skin resolved     Patient informed chaperone available to be present for breast and pelvic exam. Patient has requested no chaperone to be present. Patient has been advised what will be completed during breast and pelvic  exam.   Assessment/Plan:   1. Lichen sclerosus of vulva Clobetasol prn     03/01/22 B WHNP-BC, 10:07 AM 12/30/2021

## 2022-01-01 ENCOUNTER — Ambulatory Visit: Payer: BC Managed Care – PPO

## 2022-01-05 ENCOUNTER — Other Ambulatory Visit: Payer: Self-pay | Admitting: Orthopaedic Surgery

## 2022-01-05 DIAGNOSIS — M25551 Pain in right hip: Secondary | ICD-10-CM

## 2022-01-06 ENCOUNTER — Other Ambulatory Visit: Payer: Self-pay | Admitting: Family Medicine

## 2022-01-06 MED ORDER — EZETIMIBE 10 MG PO TABS: 10.0000 mg | ORAL_TABLET | Freq: Every day | ORAL | 1 refills | Status: DC

## 2022-01-27 ENCOUNTER — Other Ambulatory Visit: Payer: BC Managed Care – PPO

## 2022-01-27 DIAGNOSIS — Z1231 Encounter for screening mammogram for malignant neoplasm of breast: Secondary | ICD-10-CM | POA: Diagnosis not present

## 2022-02-06 ENCOUNTER — Other Ambulatory Visit: Payer: Self-pay | Admitting: Family Medicine

## 2022-02-19 ENCOUNTER — Telehealth: Payer: Self-pay

## 2022-02-19 ENCOUNTER — Other Ambulatory Visit: Payer: Self-pay | Admitting: Cardiovascular Disease

## 2022-02-19 NOTE — Patient Outreach (Signed)
  Care Coordination   02/19/2022 Name: Nishika Parkhurst MRN: [id number] DOB: 1960/10/03   Care Coordination Outreach Attempts:  An unsuccessful telephone outreach was attempted today to offer the patient information about available care coordination services as a benefit of their health plan.   Follow Up Plan:  Additional outreach attempts will be made to offer the patient care coordination information and services.   Encounter Outcome:  No Answer  Care Coordination Interventions Activated:  No   Care Coordination Interventions:  No, not indicated    Jone Baseman, RN, MSN [facility] Care Management Care Management Coordinator Direct Line [phone]

## 2022-02-20 ENCOUNTER — Encounter: Payer: Self-pay | Admitting: Family Medicine

## 2022-03-03 ENCOUNTER — Other Ambulatory Visit: Payer: Self-pay | Admitting: Family Medicine

## 2022-03-05 ENCOUNTER — Other Ambulatory Visit: Payer: Self-pay | Admitting: Cardiovascular Disease

## 2022-03-13 ENCOUNTER — Other Ambulatory Visit: Payer: Self-pay | Admitting: Cardiovascular Disease

## 2022-03-16 ENCOUNTER — Telehealth: Payer: Self-pay

## 2022-03-16 NOTE — Patient Outreach (Signed)
  Care Coordination   03/16/2022 Name: Bethany Beard MRN: [id number] DOB: 07-Aug-1960   Care Coordination Outreach Attempts:  A second unsuccessful outreach was attempted today to offer the patient with information about available care coordination services as a benefit of their health plan.     Follow Up Plan:  Additional outreach attempts will be made to offer the patient care coordination information and services.   Encounter Outcome:  No Answer   Care Coordination Interventions:  No, not indicated    Bary Leriche, RN, MSN [facility] Care Management Care Management Coordinator Direct Line [phone]

## 2022-04-02 ENCOUNTER — Telehealth: Payer: Self-pay

## 2022-04-02 NOTE — Patient Outreach (Signed)
  Care Coordination   Initial Visit Note   04/02/2022 Name: Bethany Beard MRN: [id number] DOB: 03/31/1961  Bethany Beard is a 61 y.o. year old female who sees [place], Anupa, DO for primary care. I spoke with  Quinn Plowman by phone today.  What matters to the patients health and wellness today?  none    Goals Addressed             This Visit's Progress    COMPLETED: Care Coordination Activities-No follow up required       Care Coordination Interventions: Advised patient to Annual exam.  Discussed [facility] services and support. Patient decline ongoing support.            SDOH assessments and interventions completed:  Yes  SDOH Interventions Today    Flowsheet Row Most Recent Value  SDOH Interventions   Housing Interventions Intervention Not Indicated  Transportation Interventions Intervention Not Indicated        Care Coordination Interventions:  Yes, provided   Follow up plan: No further intervention required.   Encounter Outcome:  Pt. Visit Completed   Bary Leriche, RN, MSN [facility] Care Management Care Management Coordinator Direct Line [phone]

## 2022-04-02 NOTE — Patient Instructions (Signed)
Visit Information  Thank you for taking time to visit with me today. Please don't hesitate to contact me if I can be of assistance to you.   Following are the goals we discussed today:   Goals Addressed             This Visit's Progress    COMPLETED: Care Coordination Activities-No follow up required       Care Coordination Interventions: Advised patient to Annual exam.  Discussed [facility] services and support. Patient decline ongoing support.            If you are experiencing a Mental Health or Behavioral Health Crisis or need someone to talk to, please call the Suicide and Crisis Lifeline: 988   Patient verbalizes understanding of instructions and care plan provided today and agrees to view in MyChart. Active MyChart status and patient understanding of how to access instructions and care plan via MyChart confirmed with patient.     No further follow up required:    Bary Leriche, RN, MSN [facility] Care Management Care Management Coordinator Direct Line [phone]

## 2022-04-11 ENCOUNTER — Other Ambulatory Visit: Payer: Self-pay | Admitting: Family Medicine

## 2022-04-12 ENCOUNTER — Other Ambulatory Visit: Payer: Self-pay | Admitting: Cardiovascular Disease

## 2022-04-24 ENCOUNTER — Other Ambulatory Visit: Payer: Self-pay | Admitting: Cardiovascular Disease

## 2022-04-26 ENCOUNTER — Encounter: Payer: Self-pay | Admitting: Cardiovascular Disease

## 2022-04-28 ENCOUNTER — Telehealth: Payer: Self-pay

## 2022-04-28 NOTE — Telephone Encounter (Signed)
Patient calls nurse line regarding positive COVID test. She tested positive on Sunday, 1/7 with symptom onset on Saturday 1/6. Complains of headache, sore throat, cough, congestion, body aches, and mild shortness of breath. She has been taking Breo inhaler daily and using albuterol inhaler as needed. Patient is speaking in complete sentences at this time, no current [facility] or chest pain.   She is asking if antiviral can be sent to [facility] on [address].   Discussed supportive measures and ED precautions. Will forward to PCP for further advisement.   Talbot Grumbling, RN

## 2022-04-29 NOTE — Telephone Encounter (Signed)
Called patient and advised of provider message.   Patient has no further questions at this time.   Talbot Grumbling, RN

## 2022-05-15 ENCOUNTER — Other Ambulatory Visit: Payer: Self-pay | Admitting: Family Medicine

## 2022-06-11 ENCOUNTER — Other Ambulatory Visit: Payer: Self-pay | Admitting: Cardiovascular Disease

## 2022-06-14 ENCOUNTER — Other Ambulatory Visit: Payer: Self-pay | Admitting: Family Medicine

## 2022-06-14 NOTE — Progress Notes (Unsigned)
Cardiology Office Note:   Date:  06/15/2022  NAME:  Bethany Beard    MRN: [id number] DOB:  [birth year]   PCP:  Donney Dice, DO  Cardiologist:  Evalina Field, MD  Electrophysiologist:  None   Referring MD: Donney Dice, DO   Chief Complaint  Patient presents with   Follow-up   History of Present Illness:   Bethany Beard is a 62 y.o. female with a hx of CAD, HTN, HLD, HFpEF, PVCs who presents for follow-up.  She reports she is doing well.  Denies any chest pain or trouble breathing.  Not exercising that much but she will work on this.  She did suffer a fall while working at [employer].  Had knee issues.  She does have a noticeable right carotid bruit.  This appears to be new.  She has not had labs in over a year.  She is on Zetia.  She is also on Lipitor.  Insurance company denied [place].  We will check her lipids today.  They will pay for this if her cholesterol level is not at goal on statin and nonstatin agent.  Pulse in the 50s.  No PVCs.  No symptoms of heart failure.  Denies any symptoms in office today.   Problem List HTN Tobacco abuse Asthma HLD -T chol 191, HDL 79, LDL 93, TG 108 5. Obstructive CAD -99% mid RCA, 100% dRCA with L to R collaterals -95% dLCX (Small) -40% LM -50% pLAD -medical management recommended  6. HFpEF -LVEDP 36 mmHG at [facility] 12/11/2020 7. PVCs -16.7% burden 8. PACs -4.2% burden   Past Medical History: Past Medical History:  Diagnosis Date   Anginal pain ([place])    Arthritis    Asthma    Coronary artery disease    Depression    Dyspnea    Hyperlipidemia    Hypertension    Hypothyroidism    Migraines    Pneumonia    Thyroid disease     Past Surgical History: Past Surgical History:  Procedure Laterality Date   BACK SURGERY     carpel     CHOLECYSTECTOMY     cyst remove Left    KNEE ARTHROSCOPY Right 06/24/2021   Procedure: RIGHT KNEE ARTHROSCOPY;  Surgeon: Melrose Nakayama, MD;  Location: [facility];  Service: Orthopedics;  Laterality:  Right;   LEFT HEART CATH AND CORONARY ANGIOGRAPHY N/A 12/11/2020   Procedure: LEFT HEART CATH AND CORONARY ANGIOGRAPHY;  Surgeon: Burnell Blanks, MD;  Location: [facility];  Service: Cardiovascular;  Laterality: N/A;   TONSILLECTOMY      Current Medications: Current Meds  Medication Sig   acetaminophen (TYLENOL) 650 MG CR tablet Take 1,950 mg by mouth every 8 (eight) hours as needed for pain.   albuterol (VENTOLIN HFA) 108 (90 Base) MCG/ACT inhaler Inhale 2 puffs into the lungs every 6 (six) hours as needed for wheezing or shortness of breath (Asthma).   amLODipine (NORVASC) 10 MG tablet Take 1 tablet by mouth once daily   aspirin EC 81 MG tablet Take 1 tablet (81 mg total) by mouth daily. Swallow whole.   atorvastatin (LIPITOR) 80 MG tablet Take 1 tablet by mouth once daily   clobetasol ointment (TEMOVATE) [id number] % Apply 1 application  topically 2 (two) times daily.   clopidogrel (PLAVIX) 75 MG tablet Take 1 tablet by mouth once daily   dapagliflozin propanediol (FARXIGA) 10 MG TABS tablet Take 1 tablet (10 mg total) by mouth daily.   ezetimibe (ZETIA) 10  MG tablet Take 1 tablet (10 mg total) by mouth daily.   fluticasone furoate-vilanterol (BREO ELLIPTA) 200-25 MCG/ACT AEPB Inhale 1 puff into the lungs daily.   furosemide (LASIX) 20 MG tablet Take 20 mg by mouth daily.   isosorbide mononitrate (IMDUR) 30 MG 24 hr tablet Take 1 tablet by mouth once daily   levothyroxine (SYNTHROID) 100 MCG tablet Take 1 tablet (100 mcg total) by mouth every morning. 30 minutes before food   losartan (COZAAR) 25 MG tablet Take 1 tablet by mouth once daily   metoprolol succinate (TOPROL-XL) 25 MG 24 hr tablet Take 1 tablet by mouth once daily   mometasone-formoterol (DULERA) 200-5 MCG/ACT AERO Inhale 2 puffs into the lungs in the morning and at bedtime.   montelukast (SINGULAIR) 10 MG tablet Take 1 tablet (10 mg total) by mouth at bedtime.   nitroGLYCERIN (NITROSTAT) 0.4 MG SL tablet Place 0.4  mg under the tongue every 5 (five) minutes as needed for chest pain.   pantoprazole (PROTONIX) 40 MG tablet Take 1 tablet (40 mg total) by mouth daily.   potassium chloride (KLOR-CON) 10 MEQ tablet Take 10 mEq by mouth daily.   [DISCONTINUED] citalopram (CELEXA) 20 MG tablet Take 1 tablet by mouth once daily     Allergies:    Avocado, Codeine, Other, Strawberry extract, and Tomato   Social History: Social History   Socioeconomic History   Marital status: Widowed    Spouse name: Not on file   Number of children: 1   Years of education: Not on file   Highest education level: Not on file  Occupational History   Occupation: [employer]  Tobacco Use   Smoking status: Former    Packs/day: 1.00    Years: 45.00    Total pack years: 45.00    Types: Cigarettes    Quit date: 10/2020    Years since quitting: 1.6   Smokeless tobacco: Never  Vaping Use   Vaping Use: Never used  Substance and Sexual Activity   Alcohol use: Not Currently   Drug use: Yes    Types: Marijuana   Sexual activity: Never  Other Topics Concern   Not on file  Social History Narrative   Not on file   Social Determinants of Health   Financial Resource Strain: Not on file  Food Insecurity: Not on file  Transportation Needs: No Transportation Needs (04/02/2022)   PRAPARE - Hydrologist (Medical): No    Lack of Transportation (Non-Medical): No  Physical Activity: Not on file  Stress: Not on file  Social Connections: Not on file     Family History: The patient's family history is negative for Cancer, Diabetes, Hyperlipidemia, Hypertension, Kidney disease, and Stroke. She was adopted.  ROS:   All other ROS reviewed and negative. Pertinent positives noted in the HPI.     EKGs/Labs/Other Studies Reviewed:   The following studies were personally reviewed by me today:  TTE 12/04/2020  1. Left ventricular ejection fraction, by estimation, is 60 to 65%. The  left ventricle has  normal function. The left ventricle has no regional  wall motion abnormalities. Left ventricular diastolic parameters were  normal. Elevated left ventricular  end-diastolic pressure.   2. Right ventricular systolic function is normal. The right ventricular  size is normal.   3. The mitral valve is normal in structure. Mild mitral valve  regurgitation. No evidence of mitral stenosis.   4. The aortic valve is tricuspid. Aortic valve regurgitation is  mild.  Mild to moderate aortic valve sclerosis/calcification is present, without  any evidence of aortic stenosis. Aortic regurgitation PHT measures 487  msec.   5. The inferior vena cava is dilated in size with >50% respiratory  variability, suggesting right atrial pressure of 8 mmHg.   [place] 04/20/2021  Impression: 1. Frequent PVCs (16.7% burden).  2. Occassional PACs (4.2% burden).  3. Brief ectopic atrial tachycardia episodes detected (9 episodes in 3 days; longest episode 7.4 seconds).   Recent Labs: No results found for requested labs within last 365 days.   Recent Lipid Panel    Component Value Date/Time   CHOL 191 11/11/2021 0936   TRIG 108 11/11/2021 0936   HDL 79 11/11/2021 0936   CHOLHDL 2.4 11/11/2021 0936   CHOLHDL 4 01/04/2012 1530   VLDL 24.8 01/04/2012 1530   LDLCALC 93 11/11/2021 0936   LDLDIRECT 207.2 01/04/2012 1530    Physical Exam:   VS:  BP 136/62   Pulse (!) 55   Ht '5\' 7"'$  (1.702 m)   Wt 265 lb 9.6 oz (120.5 kg)   SpO2 95%   BMI 41.60 kg/m    Wt Readings from Last 3 Encounters:  06/15/22 265 lb 9.6 oz (120.5 kg)  12/30/21 273 lb (123.8 kg)  11/07/21 268 lb 8 oz (121.8 kg)    General: Well nourished, well developed, in no acute distress Head: Atraumatic, normal size  Eyes: PEERLA, EOMI  Neck: Right carotid bruit Endocrine: No thryomegaly Cardiac: Normal S1, S2; RRR; no murmurs, rubs, or gallops Lungs: Clear to auscultation bilaterally, no wheezing, rhonchi or rales  Abd: Soft, nontender, no  hepatomegaly  Ext: No edema, pulses 2+ Musculoskeletal: No deformities, BUE and BLE strength normal and equal Skin: Warm and dry, no rashes   Neuro: Alert and oriented to person, place, time, and situation, CNII-XII grossly intact, no focal deficits  Psych: Normal mood and affect   ASSESSMENT:   Bethany Beard is a 62 y.o. female who presents for the following: 1. Coronary artery disease of native artery of native heart with stable angina pectoris ([place])   2. Mixed hyperlipidemia   3. Primary hypertension   4. Chronic diastolic heart failure ([place])   5. PVC (premature ventricular contraction)   6. Bruit of right carotid artery     PLAN:   1. Coronary artery disease of native artery of native heart with stable angina pectoris ([place]) 2. Mixed hyperlipidemia -Extensive CAD.  Occluded RCA with collaterals.  95% distal circumflex which is small.  40% left main.  50% proximal LAD.  Medical management was recommended.  LV function is normal.  No symptoms of angina.  Continue aspirin and Plavix.  She reports she may have her teeth pulled.  Okay to hold.  She can have her surgeon reach out to [place]. -Lipids were not at goal.  She is on Lipitor 80 mg daily.  Zetia 10 mg daily was added.  Insurance did not approve Repatha until she was on Zetia.  Recheck lipids today.  Including CMP, TSH, A1c.  If not at goal would recommend Repatha. -She is on appropriate antianginal therapy including amlodipine 10 mg daily, Imdur 30 mg daily, metoprolol succinate 25 mg daily.  Without any angina to report.  Seems to be doing well.  3. Primary hypertension -Controlled on above antianginal therapy including losartan 25 mg daily.  Seems to be doing well.  4. Chronic diastolic heart failure (HCC) -Controlled on Lasix daily.  5. PVC (premature ventricular contraction) -She  did have a 16% PVC burden.  She is on a beta-blocker now.  She is without complaints of palpitations.  LV function was normal.  Will continue to monitor  this for now.  6. Bruit of right carotid artery -Right carotid bruit noted on examination today.  Carotid ultrasounds have been ordered.  Continue antiplatelet and cholesterol-lowering agents as above.  Disposition: Return in about 6 months (around 12/14/2022).  Medication Adjustments/Labs and Tests Ordered: Current medicines are reviewed at length with the patient today.  Concerns regarding medicines are outlined above.  Orders Placed This Encounter  Procedures   Lipid panel   LDL cholesterol, direct   Comprehensive metabolic panel   CBC   Hemoglobin A1c   TSH   VAS US CAROTID   No orders of the defined types were placed in this encounter.   Patient Instructions  Medication Instructions:  The current medical regimen is effective;  continue present plan and medications.  *If you need a refill on your cardiac medications before your next appointment, please call your pharmacy*   Lab Work: LIPID, DIRECT LDL, A1C, CMET, TSH, CBC today   If you have labs (blood work) drawn today and your tests are completely normal, you will receive your results only by: [place] (if you have MyChart) OR A paper copy in the mail If you have any lab test that is abnormal or we need to change your treatment, we will call you to review the results.   Testing/Procedures: Your physician has requested that you have a carotid duplex. This test is an ultrasound of the carotid arteries in your neck. It looks at blood flow through these arteries that supply the brain with blood. Allow one hour for this exam. There are no restrictions or special instructions.    Follow-Up: At [facility], you and your health needs are our priority.  As part of our continuing mission to provide you with exceptional heart care, we have created designated Provider Care Teams.  These Care Teams include your primary Cardiologist (physician) and Advanced Practice Providers (APPs -  Physician Assistants and Nurse  Practitioners) who all work together to provide you with the care you need, when you need it.  We recommend signing up for the patient portal called "MyChart".  Sign up information is provided on this After Visit Summary.  MyChart is used to connect with patients for Virtual Visits (Telemedicine).  Patients are able to view lab/test results, encounter notes, upcoming appointments, etc.  Non-urgent messages can be sent to your provider as well.   To learn more about what you can do with MyChart, go to [email or web address].    Your next appointment:   6 month(s)  Provider:   Almyra Deforest, PA-C or Diona Browner, NP    Then, Evalina Field, MD will plan to see you again in 12 month(s).     Time Spent with Patient: I have spent a total of 35 minutes with patient reviewing hospital notes, telemetry, EKGs, labs and examining the patient as well as establishing an assessment and plan that was discussed with the patient.  > 50% of time was spent in direct patient care.  Signed, Addison Naegeli. Audie Box, MD, [place]  [address] [place], [place] [address] [phone]  06/15/2022 9:17 AM

## 2022-06-15 ENCOUNTER — Encounter: Payer: Self-pay | Admitting: Cardiovascular Disease

## 2022-06-15 ENCOUNTER — Ambulatory Visit: Payer: BC Managed Care – PPO | Attending: Cardiovascular Disease | Admitting: Cardiovascular Disease

## 2022-06-15 VITALS — BP 136/62 | HR 55 | Ht 67.0 in | Wt 265.6 lb

## 2022-06-15 DIAGNOSIS — R0989 Other specified symptoms and signs involving the circulatory and respiratory systems: Secondary | ICD-10-CM

## 2022-06-15 DIAGNOSIS — E782 Mixed hyperlipidemia: Secondary | ICD-10-CM

## 2022-06-15 DIAGNOSIS — I10 Essential (primary) hypertension: Secondary | ICD-10-CM

## 2022-06-15 DIAGNOSIS — I25118 Atherosclerotic heart disease of native coronary artery with other forms of angina pectoris: Secondary | ICD-10-CM | POA: Diagnosis not present

## 2022-06-15 DIAGNOSIS — I493 Ventricular premature depolarization: Secondary | ICD-10-CM

## 2022-06-15 DIAGNOSIS — I5032 Chronic diastolic (congestive) heart failure: Secondary | ICD-10-CM

## 2022-06-15 LAB — SPECIMEN STATUS REPORT

## 2022-06-15 NOTE — Patient Instructions (Signed)
Medication Instructions:  The current medical regimen is effective;  continue present plan and medications.  *If you need a refill on your cardiac medications before your next appointment, please call your pharmacy*   Lab Work: LIPID, DIRECT LDL, A1C, CMET, TSH, CBC today   If you have labs (blood work) drawn today and your tests are completely normal, you will receive your results only by: [place] (if you have MyChart) OR A paper copy in the mail If you have any lab test that is abnormal or we need to change your treatment, we will call you to review the results.   Testing/Procedures: Your physician has requested that you have a carotid duplex. This test is an ultrasound of the carotid arteries in your neck. It looks at blood flow through these arteries that supply the brain with blood. Allow one hour for this exam. There are no restrictions or special instructions.    Follow-Up: At [facility], you and your health needs are our priority.  As part of our continuing mission to provide you with exceptional heart care, we have created designated Provider Care Teams.  These Care Teams include your primary Cardiologist (physician) and Advanced Practice Providers (APPs -  Physician Assistants and Nurse Practitioners) who all work together to provide you with the care you need, when you need it.  We recommend signing up for the patient portal called "MyChart".  Sign up information is provided on this After Visit Summary.  MyChart is used to connect with patients for Virtual Visits (Telemedicine).  Patients are able to view lab/test results, encounter notes, upcoming appointments, etc.  Non-urgent messages can be sent to your provider as well.   To learn more about what you can do with MyChart, go to [email or web address].    Your next appointment:   6 month(s)  Provider:   Almyra Deforest, PA-C or Diona Browner, NP    Then, Evalina Field, MD will plan to see you again in 12  month(s).

## 2022-06-16 LAB — COMPREHENSIVE METABOLIC PANEL
ALT: 9 IU/L (ref 0–32)
AST: 11 IU/L (ref 0–40)
Albumin/Globulin Ratio: 2 (ref 1.2–2.2)
Albumin: 4.2 g/dL (ref 3.8–4.9)
Alkaline Phosphatase: 100 IU/L (ref 44–121)
BUN/Creatinine Ratio: 21 (ref 12–28)
BUN: 19 mg/dL (ref 8–27)
Bilirubin Total: 0.3 mg/dL (ref 0.0–1.2)
CO2: 22 mmol/L (ref 20–29)
Calcium: 9.2 mg/dL (ref 8.7–10.3)
Chloride: 104 mmol/L (ref 96–106)
Creatinine, Ser: 0.92 mg/dL (ref 0.57–1.00)
Globulin, Total: 2.1 g/dL (ref 1.5–4.5)
Glucose: 115 mg/dL — ABNORMAL HIGH (ref 70–99)
Potassium: 4.8 mmol/L (ref 3.5–5.2)
Sodium: 141 mmol/L (ref 134–144)
Total Protein: 6.3 g/dL (ref 6.0–8.5)
eGFR: 71 mL/min/{1.73_m2} (ref >59)

## 2022-06-16 LAB — CBC
Hematocrit: 37.4 % (ref 34.0–46.6)
Hemoglobin: 11.4 g/dL (ref 11.1–15.9)
MCH: 27.5 pg (ref 26.6–33.0)
MCHC: 30.5 g/dL — ABNORMAL LOW (ref 31.5–35.7)
MCV: 90 fL (ref 79–97)
Platelets: 268 10*3/uL (ref 150–450)
RBC: 4.14 x10E6/uL (ref 3.77–5.28)
RDW: 14.7 % (ref 11.7–15.4)
WBC: 4.9 10*3/uL (ref 3.4–10.8)

## 2022-06-16 LAB — HEMOGLOBIN A1C
Est. average glucose Bld gHb Est-mCnc: 134 mg/dL
Hgb A1c MFr Bld: 6.3 % — ABNORMAL HIGH (ref 4.8–5.6)

## 2022-06-16 LAB — LIPID PANEL
Chol/HDL Ratio: 2.1 ratio (ref 0.0–4.4)
Cholesterol, Total: 165 mg/dL (ref 100–199)
HDL: 77 mg/dL (ref >39)
LDL Chol Calc (NIH): 73 mg/dL (ref 0–99)
Triglycerides: 79 mg/dL (ref 0–149)
VLDL Cholesterol Cal: 15 mg/dL (ref 5–40)

## 2022-06-16 LAB — LDL CHOLESTEROL, DIRECT: LDL Direct: 70 mg/dL (ref 0–99)

## 2022-06-16 LAB — TSH: TSH: 1.6 u[IU]/mL (ref 0.450–4.500)

## 2022-06-17 ENCOUNTER — Other Ambulatory Visit: Payer: Self-pay

## 2022-06-17 DIAGNOSIS — E782 Mixed hyperlipidemia: Secondary | ICD-10-CM

## 2022-06-18 ENCOUNTER — Ambulatory Visit (HOSPITAL_COMMUNITY)
Admission: RE | Admit: 2022-06-18 | Discharge: 2022-06-18 | Disposition: A | Discharge status: Home / Self Care | Payer: BC Managed Care – PPO | Source: Ambulatory Visit | Attending: Cardiovascular Disease | Admitting: Cardiovascular Disease

## 2022-06-18 DIAGNOSIS — R0989 Other specified symptoms and signs involving the circulatory and respiratory systems: Secondary | ICD-10-CM | POA: Diagnosis not present

## 2022-07-05 ENCOUNTER — Other Ambulatory Visit: Payer: Self-pay | Admitting: Cardiovascular Disease

## 2022-07-05 DIAGNOSIS — I25118 Atherosclerotic heart disease of native coronary artery with other forms of angina pectoris: Secondary | ICD-10-CM

## 2022-07-05 DIAGNOSIS — I200 Unstable angina: Secondary | ICD-10-CM

## 2022-07-05 DIAGNOSIS — E7800 Pure hypercholesterolemia, unspecified: Secondary | ICD-10-CM

## 2022-07-15 ENCOUNTER — Other Ambulatory Visit: Payer: Self-pay | Admitting: Cardiovascular Disease

## 2022-07-19 ENCOUNTER — Other Ambulatory Visit: Payer: Self-pay | Admitting: Family Medicine

## 2022-07-24 ENCOUNTER — Ambulatory Visit
Payer: BC Managed Care – PPO | Attending: Cardiovascular Disease | Admitting: Pharmacist Clinician (PhC)/ Clinical Pharmacy Specialist

## 2022-07-24 ENCOUNTER — Other Ambulatory Visit: Payer: Self-pay | Admitting: Cardiovascular Disease

## 2022-07-24 DIAGNOSIS — E7800 Pure hypercholesterolemia, unspecified: Secondary | ICD-10-CM

## 2022-07-24 NOTE — Patient Instructions (Signed)
Your Results:             Your most recent labs Goal  Total Cholesterol 165 < 200  Triglycerides 79 < 150  HDL (happy/good cholesterol) 77 > 40  LDL (lousy/bad cholesterol 73 < 55   Medication changes:  We will start the process to get Repatha covered by your insurance.  Once the prior authorization is complete, I will send a MyChart message to let you know and confirm pharmacy information.   You will take one injection every 14 days  Lab orders:  We want to repeat labs after 2-3 months.  We will send you a lab order to remind you once we get closer to that time.    Go to [email or web address] and follow the prompts for "paying for my medication"  Thank you for choosing [facility]

## 2022-07-24 NOTE — Progress Notes (Signed)
Office Visit    Patient Name: Bethany Beard Date of Encounter: 08/04/2022  Primary Care Provider:  Reece LeaderGanta, Anupa, DO Primary Cardiologist:  Reatha HarpsWesley T O'Neal, MD  Chief Complaint    Hyperlipidemia   Significant Past Medical History   CAD 99% mid RCA, 100% dRCA w/L to R collaterals; 99% dLCX  HTN Controlled on amlodipine, losartan, metoprolol  HFpEF On losartan, metoprolol, dapagliflozin, furosemide  Pre-DM 2/24 A1c 6.3        Allergies  Allergen Reactions   Avocado Other (See Comments)    Was told that she was allergic but has never tried them.    Codeine Itching and Swelling   Other Other (See Comments)    Environmental and cats   Strawberry Extract Itching and Rash   Tomato Itching and Rash    History of Present Illness    Bethany PlowmanKandee Whisenant is a 62 y.o. female patient of Dr Flora Lipps'Neal, in the office to discuss options for cholesterol management. She is currently on high intensity atorvastatin as well as ezetimibe and her LDL has dropped from a high of 171 down to 73.  With her heart history, including hypertension, HF and pre-diabetes.  She notes that her insurance prefers medications to be in 90 day increments for better pricing.    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; ariety of protein, tries to eat more chicken at home  Exercise: none, on her feet at work, mix of 8-10 hour days   Accessory Clinical Findings   Lab Results  Component Value Date   CHOL 165 06/15/2022   HDL 77 06/15/2022   LDLCALC 73 06/15/2022   LDLDIRECT 70 06/15/2022   TRIG 79 06/15/2022   CHOLHDL 2.1 06/15/2022    Lab Results  Component Value Date   ALT 9 06/15/2022   AST 11 06/15/2022    ALKPHOS 100 06/15/2022   BILITOT 0.3 06/15/2022   Lab Results  Component Value Date   CREATININE 0.92 06/15/2022   BUN 19 06/15/2022   NA 141 06/15/2022   K 4.8 06/15/2022   CL 104 06/15/2022   CO2 22 06/15/2022   Lab Results  Component Value Date   HGBA1C 6.3 (H) 06/15/2022    Home Medications    Current Outpatient Medications  Medication Sig Dispense Refill   acetaminophen (TYLENOL) 650 MG CR tablet Take 1,950 mg by mouth every 8 (eight) hours as needed for pain.     albuterol (VENTOLIN HFA) 108 (90 Base) MCG/ACT inhaler Inhale 2 puffs into the lungs every 6 (six) hours as needed for wheezing or shortness of breath (Asthma). 1 each 3   amLODipine (NORVASC) 10 MG tablet Take 1 tablet by mouth once daily 90 tablet 0   aspirin EC 81 MG tablet Take 1 tablet (81 mg total) by mouth daily. Swallow whole. 90 tablet 3   atorvastatin (LIPITOR) 80 MG tablet Take 1 tablet by mouth once daily 90 tablet 3   citalopram (CELEXA) 20 MG tablet Take 1 tablet by mouth once daily 30 tablet 0   clobetasol ointment (TEMOVATE) 0.05 % Apply 1 application  topically 2 (two) times daily. 30 g 0  clopidogrel (PLAVIX) 75 MG tablet Take 1 tablet by mouth once daily 90 tablet 2   dapagliflozin propanediol (FARXIGA) 10 MG TABS tablet Take 1 tablet by mouth once daily 90 tablet 1   ezetimibe (ZETIA) 10 MG tablet Take 1 tablet by mouth once daily 90 tablet 0   fluticasone furoate-vilanterol (BREO ELLIPTA) 200-25 MCG/ACT AEPB Inhale 1 puff into the lungs daily. 180 each 1   furosemide (LASIX) 20 MG tablet Take 20 mg by mouth daily.     isosorbide mononitrate (IMDUR) 30 MG 24 hr tablet Take 1 tablet by mouth once daily 90 tablet 3   levothyroxine (SYNTHROID) 100 MCG tablet Take 1 tablet (100 mcg total) by mouth every morning. 30 minutes before food 90 tablet 3   losartan (COZAAR) 25 MG tablet Take 1 tablet by mouth once daily 90 tablet 2   metoprolol succinate (TOPROL-XL) 25 MG 24 hr tablet Take 1 tablet by  mouth once daily 90 tablet 3   mometasone-formoterol (DULERA) 200-5 MCG/ACT AERO Inhale 2 puffs into the lungs in the morning and at bedtime. 1 each 6   montelukast (SINGULAIR) 10 MG tablet Take 1 tablet (10 mg total) by mouth at bedtime. 30 tablet 0   nitroGLYCERIN (NITROSTAT) 0.4 MG SL tablet Place 0.4 mg under the tongue every 5 (five) minutes as needed for chest pain.     pantoprazole (PROTONIX) 40 MG tablet Take 1 tablet (40 mg total) by mouth daily. 30 tablet 2   potassium chloride (KLOR-CON) 10 MEQ tablet Take 10 mEq by mouth daily.     No current facility-administered medications for this visit.     Assessment & Plan    HYPERCHOLESTEROLEMIA Assessment: Patient with ASCVD not at LDL goal of < 55 Most recent LDL 73 on 06/15/22 Has been compliant with high intensity statin/ezetimibe : atorvastatin 80, ezetimibe 10 Reviewed options for lowering LDL cholesterol, including ezetimibe, PCSK-9 inhibitors, bempedoic acid and inclisiran.  Discussed mechanisms of action, dosing, side effects, potential decreases in LDL cholesterol and costs.  Also reviewed potential options for patient assistance.  Plan: Patient agreeable to starting Repatha 140 mg q14d Repeat labs after:  3 months Lipid Liver function Patient was given information on [employer] co-pay card   Phillips HayKristin Cloud Graham, PharmD CPP [facility] [address]  [place], [place] [id number] [phone]  08/04/2022, 7:44 AM

## 2022-08-04 ENCOUNTER — Encounter: Payer: Self-pay | Admitting: Pharmacist Clinician (PhC)/ Clinical Pharmacy Specialist

## 2022-08-04 NOTE — Assessment & Plan Note (Signed)
Assessment: Patient with ASCVD not at LDL goal of < 55 Most recent LDL 73 on 06/15/22 Has been compliant with high intensity statin/ezetimibe : atorvastatin 80, ezetimibe 10 Reviewed options for lowering LDL cholesterol, including ezetimibe, PCSK-9 inhibitors, bempedoic acid and inclisiran.  Discussed mechanisms of action, dosing, side effects, potential decreases in LDL cholesterol and costs.  Also reviewed potential options for patient assistance.  Plan: Patient agreeable to starting Repatha 140 mg q14d Repeat labs after:  3 months Lipid Liver function Patient was given information on [employer] co-pay card

## 2022-08-19 ENCOUNTER — Other Ambulatory Visit: Payer: Self-pay | Admitting: Family Medicine

## 2022-08-26 ENCOUNTER — Encounter: Payer: Self-pay | Admitting: Cardiovascular Disease

## 2022-08-26 NOTE — Telephone Encounter (Signed)
Spoke to patient, discussed pros and cons of supplement.

## 2022-08-31 ENCOUNTER — Telehealth: Payer: Self-pay | Admitting: Pulmonary Disease

## 2022-08-31 NOTE — Telephone Encounter (Signed)
Pt calling in bc she needs her Breo refilled and the pharmacy said they ws trying to get a hold of [place] to get it refilled.Please make sure it is  3mon Supply because she doesn't have to pay anything as to a 1 month supply it $300. [facility] [address]

## 2022-08-31 NOTE — Telephone Encounter (Signed)
Okay to provide patient with 3 month supply of breo since she hasn't been seen since 2022. Will call to schedule f/u

## 2022-09-08 NOTE — Telephone Encounter (Signed)
Yes - please provide 3 month refill - I thought this was done last week. Look forward to seeing her again soon.

## 2022-09-08 NOTE — Telephone Encounter (Signed)
Dr. Judeth Horn, please advise if you would be okay with [place] refilling pt's Breo to hold her over until appt with you that has been scheduled.

## 2022-09-08 NOTE — Telephone Encounter (Signed)
Patient is calling again to request a refill for her Breo.  She has not been seen since 2022 but has scheduled an appt. And would like a refill for her meds until her appt.  Please advise.  CB# [phone]

## 2022-09-09 ENCOUNTER — Other Ambulatory Visit: Payer: Self-pay

## 2022-09-09 MED ORDER — FLUTICASONE FUROATE-VILANTEROL 200-25 MCG/ACT IN AEPB: 1.0000 | INHALATION_SPRAY | Freq: Every day | RESPIRATORY_TRACT | 1 refills | Status: DC

## 2022-09-09 NOTE — Telephone Encounter (Signed)
Refill of Virgel Bouquet has been sent to pharmacy. Patient is aware. NFN

## 2022-09-09 NOTE — Telephone Encounter (Signed)
Bethany Beard has been sent. NFN

## 2022-09-09 NOTE — Telephone Encounter (Signed)
Patient checking on message for [facility] inhaler. Patient phone number is [phone].

## 2022-09-18 ENCOUNTER — Other Ambulatory Visit: Payer: Self-pay | Admitting: Cardiovascular Disease

## 2022-09-18 ENCOUNTER — Ambulatory Visit: Payer: BC Managed Care – PPO | Admitting: Pulmonary Disease

## 2022-09-25 ENCOUNTER — Other Ambulatory Visit: Payer: Self-pay | Admitting: Family Medicine

## 2022-09-29 ENCOUNTER — Encounter: Payer: Self-pay | Admitting: Pulmonary Disease

## 2022-09-29 ENCOUNTER — Ambulatory Visit (INDEPENDENT_AMBULATORY_CARE_PROVIDER_SITE_OTHER): Payer: BC Managed Care – PPO | Admitting: Pulmonary Disease

## 2022-09-29 VITALS — BP 118/74 | HR 50 | Temp 98.1°F | Ht 67.0 in | Wt 275.0 lb

## 2022-09-29 DIAGNOSIS — J4540 Moderate persistent asthma, uncomplicated: Secondary | ICD-10-CM

## 2022-09-29 NOTE — Patient Instructions (Signed)
Nice to see you  We will continue Breo as we are  Return to clinic in 1 year or sooner as needed

## 2022-09-29 NOTE — Progress Notes (Signed)
@Patient  ID: Bethany Beard, female    DOB: 1961-01-22, 63 y.o.   MRN: [id number]  Chief Complaint  Patient presents with   Follow-up    Breathing is overall doing well. She has been using her albuterol inhaler 2-3 x per day.     Referring provider: Reece Leader, DO  HPI:   62 y.o. woman whom we are seeing in follow up of asthma and DOE. Most recent cardiology note reviewed.  Returns to clinic for routine follow-up.  Last seen almost 2 years ago. Doing well overall.  Reports good adherence to [facility].  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  Otherwise meaning doing okay.  She is using albuterol relatively frequently at least once daily.  No exacerbations or hospitalizations in the interim while on Breo.  HPI at initial visit: Patient reports longstanding history of asthma.  Was on allergy shots twice a week for many years.  Last received over a year ago.  He feels her breathing has not been well controlled.  More short of breath.  Worse dyspnea on inclines or stairs.  Usually okay at rest.  Some cough.  Worse in the evenings.  No position where things are better or worse.  No environmental or seasonal changes she can identify to make things better or worse.  Not really using any inhalers lately.  No other alleviating or exacerbating factors.  Today, she notes left leg is swollen.  She notes that both legs stay swollen.  Usually get better overnight, worsened during the day.  Left leg is more swollen than the right which she is concerned about.  Reviewed most recent chest x-ray 08/2020 D[facility]meier interpretation reveals clear lungs bilaterally.  PMH: Asthma, GERD, seasonal allergies Family history: No significant [place] severity relatives Surgical history: Back surgery, carpal tunnel surgery, cholecystectomy, tonsillectomy Social history: Current smoker, approximately 40+ pack years, lives in [place]   Questionaires / Pulmonary Flowsheets:   ACT:  Asthma Control Test ACT Total  Score  09/29/2022 10:34 AM 17   MMRC:     No data to display          Epworth:      No data to display          Tests:   FENO:  No results found for: "NITRICOXIDE"  PFT:     No data to display          WALK:      No data to display          Imaging: Personally reviewed as per EMR discussion this note  Lab Results: Personally reviewed CBC    Component Value Date/Time   WBC 4.9 06/15/2022 0841   WBC 6.1 06/03/2021 0950   RBC 4.14 06/15/2022 0841   RBC 3.69 (L) 06/03/2021 0950   HGB 11.4 06/15/2022 0841   HCT 37.4 06/15/2022 0841   PLT 268 06/15/2022 0841   MCV 90 06/15/2022 0841   MCH 27.5 06/15/2022 0841   MCH 28.7 06/03/2021 0950   MCHC 30.5 (L) 06/15/2022 0841   MCHC 31.0 06/03/2021 0950   RDW 14.7 06/15/2022 0841   LYMPHSABS 1.2 06/24/2020 1059   MONOABS 0.4 01/16/2019 0450   EOSABS 0.4 06/24/2020 1059   BASOSABS 0.1 06/24/2020 1059    BMET    Component Value Date/Time   NA 141 06/15/2022 0841   K 4.8 06/15/2022 0841   CL 104 06/15/2022 0841   CO2 22 06/15/2022 0841   GLUCOSE 115 (H) 06/15/2022  0841   GLUCOSE 113 (H) 06/03/2021 0950   BUN 19 06/15/2022 0841   CREATININE 0.92 06/15/2022 0841   CALCIUM 9.2 06/15/2022 0841   GFRNONAA >60 06/03/2021 0950   GFRAA >60 01/16/2019 0450    BNP No results found for: "BNP"  ProBNP No results found for: "PROBNP"  Specialty Problems       Pulmonary Problems   Allergic rhinitis    Qualifier: Diagnosis of  By: Daphine Deutscher FNP, [place]         Allergies  Allergen Reactions   Avocado Other (See Comments)    Was told that she was allergic but has never tried them.    Codeine Itching and Swelling   Other Other (See Comments)    Environmental and cats   Strawberry Extract Itching and Rash   Tomato Itching and Rash    Immunization History  Administered Date(s) Administered   Influenza Split 01/04/2012   Influenza Whole 04/10/2008   Influenza,inj,Quad PF,6+ Mos 06/24/2020    PFIZER Comirnaty(Gray Top)Covid-19 Tri-Sucrose Vaccine 08/10/2019, 09/04/2019, 06/24/2020   Td 04/10/2008   Tdap 01/04/2012    Past Medical History:  Diagnosis Date   Anginal pain (HCC)    Arthritis    Asthma    Coronary artery disease    Depression    Dyspnea    Hyperlipidemia    Hypertension    Hypothyroidism    Migraines    Pneumonia    Thyroid disease     Tobacco History: Social History   Tobacco Use  Smoking Status Former   Packs/day: 1.00   Years: 45.00   Additional pack years: 0.00   Total pack years: 45.00   Types: Cigarettes   Quit date: 10/2020   Years since quitting: 1.9  Smokeless Tobacco Never   Counseling given: Not Answered   Continue to not smoke  Outpatient Encounter Medications as of 09/29/2022  Medication Sig   acetaminophen (TYLENOL) 650 MG CR tablet Take 1,950 mg by mouth every 8 (eight) hours as needed for pain.   albuterol (VENTOLIN HFA) 108 (90 Base) MCG/ACT inhaler Inhale 2 puffs into the lungs every 6 (six) hours as needed for wheezing or shortness of breath (Asthma).   amLODipine (NORVASC) 10 MG tablet Take 1 tablet by mouth once daily   aspirin EC 81 MG tablet Take 1 tablet (81 mg total) by mouth daily. Swallow whole.   atorvastatin (LIPITOR) 80 MG tablet Take 1 tablet by mouth once daily   citalopram (CELEXA) 20 MG tablet Take 1 tablet by mouth once daily   clobetasol ointment (TEMOVATE) 0.05 % Apply 1 application  topically 2 (two) times daily.   clopidogrel (PLAVIX) 75 MG tablet Take 1 tablet by mouth once daily   dapagliflozin propanediol (FARXIGA) 10 MG TABS tablet Take 1 tablet by mouth once daily   ezetimibe (ZETIA) 10 MG tablet Take 1 tablet by mouth once daily   fluticasone furoate-vilanterol (BREO ELLIPTA) 200-25 MCG/ACT AEPB Inhale 1 puff into the lungs daily.   furosemide (LASIX) 20 MG tablet Take 20 mg by mouth daily.   isosorbide mononitrate (IMDUR) 30 MG 24 hr tablet Take 1 tablet by mouth once daily   levothyroxine  (SYNTHROID) 100 MCG tablet Take 1 tablet (100 mcg total) by mouth every morning. 30 minutes before food   losartan (COZAAR) 25 MG tablet Take 1 tablet by mouth once daily   metoprolol succinate (TOPROL-XL) 25 MG 24 hr tablet Take 1 tablet by mouth once daily   nitroGLYCERIN (NITROSTAT) 0.4  MG SL tablet Place 0.4 mg under the tongue every 5 (five) minutes as needed for chest pain.   pantoprazole (PROTONIX) 40 MG tablet Take 1 tablet (40 mg total) by mouth daily.   potassium chloride (KLOR-CON) 10 MEQ tablet Take 10 mEq by mouth daily.   [DISCONTINUED] mometasone-formoterol (DULERA) 200-5 MCG/ACT AERO Inhale 2 puffs into the lungs in the morning and at bedtime.   [DISCONTINUED] montelukast (SINGULAIR) 10 MG tablet Take 1 tablet (10 mg total) by mouth at bedtime.   No facility-administered encounter medications on file as of 09/29/2022.     Review of Systems  Review of Systems  N/a Physical Exam  BP 118/74 (BP Location: Left Arm, Cuff Size: Normal)   Pulse (!) 50   Temp 98.1 F (36.7 C) (Oral)   Ht 5\' 7"  (1.702 m)   Wt 275 lb (124.7 kg)   SpO2 95% Comment: on RA  BMI 43.07 kg/m   Wt Readings from Last 5 Encounters:  09/29/22 275 lb (124.7 kg)  06/15/22 265 lb 9.6 oz (120.5 kg)  12/30/21 273 lb (123.8 kg)  11/07/21 268 lb 8 oz (121.8 kg)  10/30/21 268 lb 9.6 oz (121.8 kg)    BMI Readings from Last 5 Encounters:  09/29/22 43.07 kg/m  06/15/22 41.60 kg/m  12/30/21 42.76 kg/m  11/07/21 42.05 kg/m  10/30/21 43.35 kg/m     Physical Exam General: In chair, no acute distress Eyes: EOMI, icterus Pulmonary: Clear, distant, normal work of breathing Cardiovascular: Regular in rhythm, no murmur Abdomen: Nondistended, bowel sounds present MSK: No synovitis, joint effusion Neuro: Normal gait, no weakness Psych: Normal mood, full affect  Assessment & Plan:   Dyspnea on exertion: Concern for poorly controlled asthma.  In addition, she is a former smoker so contribution of  smoking-related disease as well.  High concern for obesity as well as deconditioning given relative lack of activity.  CAD as well. Great improvement with high-dose ICS/LABA therapy.    Asthma:  Stress importance of maintenance inhaler.  Continue high-dose Breo given significant improvement in symptoms.  Ideally would have less rescue inhaler use but no hospitalizations or exacerbations in the last 1-1/2 years.   Return in about 1 year (around 09/29/2023).   Karren Burly, MD

## 2022-10-09 ENCOUNTER — Other Ambulatory Visit: Payer: Self-pay | Admitting: Cardiovascular Disease

## 2022-10-09 DIAGNOSIS — I200 Unstable angina: Secondary | ICD-10-CM

## 2022-10-09 DIAGNOSIS — E7800 Pure hypercholesterolemia, unspecified: Secondary | ICD-10-CM

## 2022-10-09 DIAGNOSIS — I25118 Atherosclerotic heart disease of native coronary artery with other forms of angina pectoris: Secondary | ICD-10-CM

## 2022-10-19 ENCOUNTER — Other Ambulatory Visit: Payer: Self-pay | Admitting: Family Medicine

## 2022-10-26 ENCOUNTER — Other Ambulatory Visit: Payer: Self-pay

## 2022-10-26 MED ORDER — CITALOPRAM HYDROBROMIDE 20 MG PO TABS: 20.0000 mg | ORAL_TABLET | Freq: Every day | ORAL | 0 refills | Status: DC

## 2022-10-29 ENCOUNTER — Encounter: Payer: Self-pay | Admitting: Student

## 2022-10-29 ENCOUNTER — Ambulatory Visit (INDEPENDENT_AMBULATORY_CARE_PROVIDER_SITE_OTHER): Payer: BC Managed Care – PPO | Admitting: Student

## 2022-10-29 VITALS — BP 147/59 | HR 56 | Wt 271.0 lb

## 2022-10-29 DIAGNOSIS — Z6841 Body Mass Index (BMI) 40.0 and over, adult: Secondary | ICD-10-CM

## 2022-10-29 DIAGNOSIS — G629 Polyneuropathy, unspecified: Secondary | ICD-10-CM

## 2022-10-29 LAB — POCT GLYCOSYLATED HEMOGLOBIN (HGB A1C): Hemoglobin A1C: 5.9 % — AB (ref 4.0–5.6)

## 2022-10-29 MED ORDER — GABAPENTIN 100 MG PO CAPS
100.0000 mg | ORAL_CAPSULE | Freq: Two times a day (BID) | ORAL | 1 refills | Status: DC
Start: 2022-10-29 — End: 2023-01-25

## 2022-10-29 NOTE — Patient Instructions (Signed)
It was great to see you! Thank you for allowing me to participate in your care!   I recommend that you always bring your medications to each appointment as this makes it easy to ensure we are on the correct medications and helps [place] not miss when refills are needed.  Our plans for today:  - We will call you with the results of the A1C - Take 100 mg of Gabapentin in the morning and 100 mg in the evening  We are checking some labs today, I will call you if they are abnormal will send you a MyChart message or a letter if they are normal.  If you do not hear about your labs in the next 2 weeks please let us know.  Take care and seek immediate care sooner if you develop any concerns. Please remember to show up 15 minutes before your scheduled appointment time!  Tiffany Kocher, DO [facility]

## 2022-10-29 NOTE — Progress Notes (Signed)
    SUBJECTIVE:   CHIEF COMPLAINT / HPI:   CC: Weight management Rash Right foot pain (Back injury in 1999, with sciatica, worse now) Knee pain Depression Vaccination questions Right foot pain   Using shared decision making, opted to discuss right foot pain today.  Appointments for weight loss management, rash and depression scheduled.  Foot pain Numbness and tingling on plantar aspect of right foot, worsening over 6 months.  Last A1c of 6.4.  Notes a back injury requiring surgery in 1999, which had sciatica.  She was always had some level of neuropathic symptoms and foot, however these are now consistent.  No wounds on the bottom of the foot.  No recent trauma.  PERTINENT  PMH / PSH: Unstable angina, hypothyroidism, tobacco use, obesity, mood changes  OBJECTIVE:   BP (!) 133/54   Pulse (!) 56   Wt 271 lb (122.9 kg)   SpO2 98%   BMI 42.44 kg/m    General: NAD, pleasant Cardio: RRR, no MRG. Cap Refill <2s. Respiratory: CTAB, normal wob on RA MSK: Loss of pinprick sensation on plantar aspect of R foot. Pulses intact. No wounds on foot. Full ROM and str 5/5 on R. Straight leg test negative bilaterally.  ASSESSMENT/PLAN:   Neuropathy, R foot Last A1c of 6.4, will recheck today in setting of comorbidities.  Unclear cause of neuropathy, differential includes: Type 2 diabetes mellitus, hypothyroidism (currently being treated-TSH WNL 4 months ago). -Trial gabapentin 100 mg twice daily - A1c - Follow-up in approximately 2 weeks  Follow-up recommendations Next visit will discuss weight loss management to include lifestyle modifications and medication. Patient like to follow-up on rash, appointment has been made Patient would like to discuss depressive symptoms, currently on Celexa.  Appointment has been made.  Tiffany Kocher, DO [facility]

## 2022-11-05 ENCOUNTER — Other Ambulatory Visit: Payer: Self-pay | Admitting: Cardiovascular Disease

## 2022-11-13 ENCOUNTER — Ambulatory Visit: Payer: BC Managed Care – PPO | Admitting: Student

## 2022-11-13 ENCOUNTER — Encounter: Payer: Self-pay | Admitting: Student

## 2022-11-13 VITALS — BP 120/60 | HR 49 | Ht 67.0 in | Wt 276.4 lb

## 2022-11-13 DIAGNOSIS — Z6841 Body Mass Index (BMI) 40.0 and over, adult: Secondary | ICD-10-CM | POA: Diagnosis not present

## 2022-11-13 DIAGNOSIS — L309 Dermatitis, unspecified: Secondary | ICD-10-CM

## 2022-11-13 MED ORDER — TRIAMCINOLONE ACETONIDE 0.5 % EX OINT
1.0000 | TOPICAL_OINTMENT | Freq: Two times a day (BID) | CUTANEOUS | 0 refills | Status: DC
Start: 2022-11-13 — End: 2022-12-09

## 2022-11-13 NOTE — Assessment & Plan Note (Signed)
Weight 2 7 6  pounds, BMI 43.29.  Initial visit for weight loss counseling.  Discussed options from lifestyle to medication.  Opting for lifestyle management at this time, we will follow-up in 1 month. SMART goal set: Reduce caloric drinks by 50%, and increase protein intake by 50%.  Bonus goal: Walk 30 minutes 1 day/week. -Diet counseling provided - Smart goal established - Follow-up in 1 month - Consider initiation of medications

## 2022-11-13 NOTE — Patient Instructions (Addendum)
It was great to see you! Thank you for allowing me to participate in your care!   I recommend that you always bring your medications to each appointment as this makes it easy to ensure we are on the correct medications and helps [place] not miss when refills are needed.  Our plans for today:  -For every 10 calories you want 1 g of protein.  For example if a protein bar has 200 cal, it should have 20 g of protein. -Your goal is to reduce sugary drinks including tea, soda, frozen drinks, coffee with sugar to less than 50% of your typical intake over the next 1 month -Try to increase your current protein intake by 50% -When you have a snack I recommend a high-protein snack like high-protein yogurt, cheese, eggs, nuts.  Or choose a low calorie snack like carrot sticks/apple slices or other fruit/vegetable of your choice. -Inquire with your insurance for coverage of Ozempic, Trulicity, [id number] for weight loss  Take care and seek immediate care sooner if you develop any concerns. Please remember to show up 15 minutes before your scheduled appointment time!  Tiffany Kocher, DO [facility]

## 2022-11-13 NOTE — Progress Notes (Signed)
    SUBJECTIVE:   CHIEF COMPLAINT / HPI:   Weight loss History: Patient has tried taking diet pills approximately 20 years ago (he does not over the name) and it did not work.  She had tried weight watchers before, had some improvement-but did not stick with it.  She endorses some weight loss in 2009 after gallbladder surgery.  She is never had bariatric surgery.  Heaviest weight was 300 pounds.  She feels motivated to lose weight.  Patient reported barriers: Cost of healthy food and exhaustion from work preventing exercise Diet: Eating 3 meals a day, states she has snacks.  She receives soft drinks appropriate her work.  Drinking multiple soft drinks in addition to coffee with sugar and cream.  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  Exercise: Not exercising outside of standing at work.  Motivated to try.  Medication: Patient is open to medications for weight loss, and will inquire with her insurance to see if any GLP-1 agonists are covered.  Discussed other options including phentermine/topiramate and bupropion/naltrexone.  Did not discuss surgical consult today.  Rash After AVS was delivered, patient endorsed pruritic scaly rash on bilateral elbows.  Requesting ointment for relief.  Additionally, patient is scheduled next week for follow-up of rash.  PERTINENT  PMH / PSH:   OBJECTIVE:   BP 120/60   Pulse (!) 49   Ht 5\' 7"  (1.702 m)   Wt 276 lb 6 oz (125.4 kg)   SpO2 95%   BMI 43.29 kg/m    General: NAD, well-appearing, well-nourished Respiratory: No respiratory distress, breathing comfortably, able to speak in full sentences Skin: warm and dry.  Erythematous and scaly rash on bilateral elbows. Psych: Appropriate affect and mood  ASSESSMENT/PLAN:   OBESITY Weight 2 7 6  pounds, BMI 43.29.  Initial visit for weight  loss counseling.  Discussed options from lifestyle to medication.  Opting for lifestyle management at this time, we will follow-up in 1 month. SMART goal set: Reduce caloric drinks by 50%, and increase protein intake by 50%.  Bonus goal: Walk 30 minutes 1 day/week. -Diet counseling provided - Smart goal established - Follow-up in 1 month - Consider initiation of medications  Rash Rash on bilateral elbow.  Consistent with eczema versus psoriasis. Hx of dyshidrotic eczema. Patient experiencing pruritus and requesting relief. - Triamcinolone ointment provided - Follow-up scheduled next week to discuss further  Follow-up recommendations Follow-up on rash, appears eczema v psoriasis  Tiffany Kocher, DO [facility]

## 2022-11-20 ENCOUNTER — Encounter: Payer: Self-pay | Admitting: Student

## 2022-11-20 ENCOUNTER — Ambulatory Visit: Payer: BC Managed Care – PPO | Admitting: Student

## 2022-11-20 VITALS — BP 117/64 | HR 48 | Ht 67.0 in | Wt 280.1 lb

## 2022-11-20 DIAGNOSIS — J302 Other seasonal allergic rhinitis: Secondary | ICD-10-CM | POA: Diagnosis not present

## 2022-11-20 DIAGNOSIS — Z87891 Personal history of nicotine dependence: Secondary | ICD-10-CM

## 2022-11-20 DIAGNOSIS — Z122 Encounter for screening for malignant neoplasm of respiratory organs: Secondary | ICD-10-CM | POA: Diagnosis not present

## 2022-11-20 DIAGNOSIS — R21 Rash and other nonspecific skin eruption: Secondary | ICD-10-CM

## 2022-11-20 MED ORDER — FLUTICASONE PROPIONATE 50 MCG/ACT NA SUSP
2.0000 | Freq: Every day | NASAL | 6 refills | Status: DC
Start: 2022-11-20 — End: 2023-04-02

## 2022-11-20 NOTE — Patient Instructions (Addendum)
It was great to see you! Thank you for allowing me to participate in your care!   I recommend that you always bring your medications to each appointment as this makes it easy to ensure we are on the correct medications and helps [place] not miss when refills are needed.  Our plans for today:  -Continue the triamcinolone ointment twice a day for 1 more week, then stop -Use nonscented lotion on dry skin, then cover with Aquaphor or Vaseline -Use 2 sprays of Flonase in each nostril daily for allergies -I have ordered a low-dose CT scan to be completed for lung cancer screening -Use over-the-counter Debrox, follow instructions on packaging for earwax.  Do not use Q-tips in the ear canal.   Take care and seek immediate care sooner if you develop any concerns. Please remember to show up 15 minutes before your scheduled appointment time!  Tiffany Kocher, DO [facility]

## 2022-11-20 NOTE — Assessment & Plan Note (Signed)
45 pack years, quit date 2022. Meets USPTF criteria for lung cancer screening. -LDCT chest ordered

## 2022-11-20 NOTE — Progress Notes (Signed)
    SUBJECTIVE:   CHIEF COMPLAINT / HPI:   Right Ear Fullness Patient presents with right ear fullness, which started on Tuesday.  Not painful, but difficulty here.  States it sounds like a " drum noise."  She wears ear buds and uses Q-tips to clean her ears.  Intermittent ringing in the ears, not currently.  Endorses pruritic eyes and throat.  No fevers, no cough, no NVD.  Endorses seasonal allergies.  Rash Patient developed a rash approximately 6 months ago.  History of eczema, however this is not her typical location for rash. Rash present on right elbow and right leg.  Rash was scaly and pruritic.  Last visit she was prescribed triamcinolone ointment, which is significantly improved her rash.  No other systemic symptoms, aside those listed above.  Smoking history Former smoker, started at 62 years old.  Has 45 pack years.  Quit smoking in 2022.  She meets criteria for lung cancer screening based on USPSTF guidelines.  PERTINENT  PMH / PSH: Allergic rhinitis, GERD, hypothyroidism, dyshidrotic eczema  OBJECTIVE:   BP 117/64   Pulse (!) 48   Ht 5\' 7"  (1.702 m)   Wt 280 lb 2 oz (127.1 kg)   SpO2 95%   BMI 43.87 kg/m    General: NAD, pleasant HEENT: Normocephalic, atraumatic head. Normal external ear, canal, TM bilaterally. EOM intact and normal conjunctiva BL. Normal external nose. Throat not erythematous, no exudate, no deviation. Normal dentition.  Cardio: RRR, no MRG. Cap Refill <2s. Respiratory: CTAB, normal wob on RA GI: Abdomen is soft, not tender, not distended. BS present Skin: Warm and dry.  Erythematous, scaly plaques on right elbow and right lower leg.  See image available below.   ASSESSMENT/PLAN:   Allergic Rhinitis Primary suspect ear fullness related to allergic rhinitis in context of allergic symptoms and history of seasonal allergies.  Hearing intact and benign physical exam. - Trial Flonase daily - Continue OTC cetirizine daily - Follow-up if not improved or  symptoms worsen  Rash Present for 6 months, history of eczema in the past.  Scaly lesion on right elbow and lower leg. Responsive to triamcinolone. - Continue triamcinolone twice daily for 1 more week, then stop - Avoid scented lotions, and use barrier ointments to keep skin protected - If not improving, consider scrape  History of tobacco use 45 pack years, quit date 2022. Meets USPTF criteria for lung cancer screening. -LDCT chest ordered   Follow-up recommendations Appointment scheduled to discuss Celexa and depressive symptoms per patient request Evaluate efficacy of topical corticosteroid Schedule weight loss appointment with PCP for September  Tiffany Kocher, DO [facility] Medicine Center

## 2022-11-24 ENCOUNTER — Other Ambulatory Visit: Payer: Self-pay | Admitting: Cardiovascular Disease

## 2022-11-25 ENCOUNTER — Other Ambulatory Visit: Payer: Self-pay | Admitting: Student

## 2022-11-30 ENCOUNTER — Encounter: Payer: Self-pay | Admitting: Family Medicine

## 2022-12-04 ENCOUNTER — Encounter: Payer: Self-pay | Admitting: Student

## 2022-12-04 ENCOUNTER — Ambulatory Visit (INDEPENDENT_AMBULATORY_CARE_PROVIDER_SITE_OTHER): Payer: BC Managed Care – PPO | Admitting: Student

## 2022-12-04 ENCOUNTER — Other Ambulatory Visit: Payer: Self-pay

## 2022-12-04 VITALS — BP 135/76 | HR 79 | Ht 67.0 in | Wt 277.6 lb

## 2022-12-04 DIAGNOSIS — F330 Major depressive disorder, recurrent, mild: Secondary | ICD-10-CM | POA: Diagnosis not present

## 2022-12-04 DIAGNOSIS — L989 Disorder of the skin and subcutaneous tissue, unspecified: Secondary | ICD-10-CM | POA: Diagnosis not present

## 2022-12-04 MED ORDER — BUSPIRONE HCL 5 MG PO TABS
5.0000 mg | ORAL_TABLET | Freq: Two times a day (BID) | ORAL | 1 refills | Status: DC
Start: 2022-12-04 — End: 2023-02-04

## 2022-12-04 MED ORDER — CITALOPRAM HYDROBROMIDE 20 MG PO TABS
20.0000 mg | ORAL_TABLET | Freq: Every day | ORAL | 1 refills | Status: DC
Start: 2022-12-04 — End: 2023-06-07

## 2022-12-04 NOTE — Patient Instructions (Signed)
It was great to see you! Thank you for allowing me to participate in your care!   I recommend that you always bring your medications to each appointment as this makes it easy to ensure we are on the correct medications and helps [place] not miss when refills are needed.  Our plans for today:  - Take Buspar 5 mg twice a day. If you do not feel benefit after 1 week, you can increase to 5 mg three times daily. - Please call and schedule appointment to see a therapist. Part of your depressive symptoms may be related to complicated grief and a counselor may help with this. - Follow up for Cryotherapy of the Seborrheic keratosis of back.   Take care and seek immediate care sooner if you develop any concerns. Please remember to show up 15 minutes before your scheduled appointment time!  Tiffany Kocher, DO [facility] Medicine

## 2022-12-04 NOTE — Assessment & Plan Note (Addendum)
MDD v. Complicated grief disorder. No SI/HI. Will augment Celexa with Buspar (titrate as necessary). Recommend patient start behavior health counseling. Could consider titration of Celexa, but caution with Qtc and her cardiac history. -Follow-up in 2 to 4 weeks

## 2022-12-04 NOTE — Assessment & Plan Note (Addendum)
Larger lesion consistent with Sub K. Smaller lesion Sub K versus Actinic K. Patient scheduled for Cryotherapy in ~2 weeks.

## 2022-12-04 NOTE — Progress Notes (Signed)
    SUBJECTIVE:   CHIEF COMPLAINT / HPI:   Major Depressive Disorder Patient presenting to discuss depressive symptoms.  In 2008 patient lost her husband, unfortunately she found her husband deceased in the morning-has some PTSD symptoms from this.  She had seen psychiatry at that time as well as counselor for few weeks.  She was darted on Celexa 20 mg and has continued that ever since.  2 years ago her stepdaughter, daughter of her deceased husband passed away 2/2 drug overdose.  Patient reports worsening of symptoms since then.  Patient does have occasional episodes of not sleeping, however inconsistent with manic symptoms.  No delusions, no high risk behavior.  Does endorse some checking behaviors, however she does not have obsessive thoughts nor rituals.  When she is taking her Celexa she has noticed less depressive symptoms her primary symptoms include: Loss of interest, increased appetite, anger dysregulation.  She denies any homicidal/suicidal ideations.  She is interested in augmenting medication therapy and is agreeable to seeing behavioral health counseling.  Lesion on back Present for many months, has grown and become pruritic.  Occasionally has blood after getting caught on her bra.  Not sun exposed area.  PERTINENT  PMH / PSH: Thyroidism, neuropathy, MDD, tobacco use  OBJECTIVE:   BP 135/76   Pulse 79   Ht 5\' 7"  (1.702 m)   Wt 277 lb 9.6 oz (125.9 kg)   SpO2 97%   BMI 43.48 kg/m    General: NAD, pleasant HEENT: Normocephalic, atraumatic head. Normal external ear, canal, TM bilaterally. EOM intact and normal conjunctiva BL. Normal external nose. Throat not erythematous, no exudate, no deviation. Normal dentition.  Cardio: RRR, no MRG. Cap Refill <2s. Respiratory: CTAB, normal wob on RA GI: Abdomen is soft, not tender, not distended. BS present Skin: Warm and dry. ~ 1.5 cm and <0.5 cm hyperpigmented, crusted, lesions on back. No erythema, no bleeding. See image  below.   ASSESSMENT/PLAN:   Mild episode of recurrent major depressive disorder [facility]) Assessment & Plan: MDD v. Complicated grief disorder. No SI/HI. Will augment Celexa with Buspar (titrate as necessary). Recommend patient start behavior health counseling. Could consider titration of Celexa, but caution with Qtc and her cardiac history. -Follow-up in 2 to 4 weeks  Orders: -     busPIRone HCl; Take 1 tablet (5 mg total) by mouth 2 (two) times daily.  Dispense: 60 tablet; Refill: 1 -     Citalopram Hydrobromide; Take 1 tablet (20 mg total) by mouth daily.  Dispense: 60 tablet; Refill: 1  Skin lesion of back Assessment & Plan: Larger lesion consistent with Sub K. Smaller lesion Sub K versus Actinic K. Patient scheduled for Cryotherapy in ~2 weeks.    Follow-up recommendations Follow-up results of low-dose CT scan on 12/08/2022 Discuss health care gaps   Tiffany Kocher, DO [facility]

## 2022-12-08 ENCOUNTER — Ambulatory Visit
Admission: RE | Admit: 2022-12-08 | Discharge: 2022-12-08 | Disposition: A | Discharge status: Home / Self Care | Payer: BC Managed Care – PPO | Source: Ambulatory Visit | Attending: Family Medicine | Admitting: Family Medicine

## 2022-12-08 DIAGNOSIS — Z87891 Personal history of nicotine dependence: Secondary | ICD-10-CM | POA: Diagnosis not present

## 2022-12-08 DIAGNOSIS — Z122 Encounter for screening for malignant neoplasm of respiratory organs: Secondary | ICD-10-CM

## 2022-12-09 ENCOUNTER — Other Ambulatory Visit: Payer: Self-pay | Admitting: Student

## 2022-12-09 DIAGNOSIS — L309 Dermatitis, unspecified: Secondary | ICD-10-CM

## 2022-12-15 ENCOUNTER — Encounter: Payer: Self-pay | Admitting: Student

## 2022-12-15 ENCOUNTER — Ambulatory Visit (INDEPENDENT_AMBULATORY_CARE_PROVIDER_SITE_OTHER): Payer: BC Managed Care – PPO | Admitting: Student

## 2022-12-15 VITALS — BP 118/70 | HR 54 | Ht 67.0 in | Wt 279.0 lb

## 2022-12-15 DIAGNOSIS — L821 Other seborrheic keratosis: Secondary | ICD-10-CM | POA: Diagnosis not present

## 2022-12-15 NOTE — Progress Notes (Signed)
    SUBJECTIVE:   CHIEF COMPLAINT / HPI:   Symptomatic seborrheic keratoses Pruritic seborrheic keratoses, occasionally getting caught on clothing leading to discomfort.  Patient would like cryotherapy today.  No acute concerns today.  OBJECTIVE:   Ht 5\' 7"  (1.702 m)   Wt 279 lb (126.6 kg)   BMI 43.70 kg/m    General: NAD, well-appearing, well-nourished Respiratory: No respiratory distress, breathing comfortably, able to speak in full sentences Skin: warm and dry. Two Sub K on back, see image below. Psych: Appropriate affect and mood     ASSESSMENT/PLAN:   Sebhorreic Keratoses Symptomatic seborrheic keratoses.  Larger lesion 1 cm, smaller lesion less than 5 mm, differential includes actinic keratosis. - Cryotherapy, see procedure note below - Follow-up if lesions fail to improve over the next 2 weeks  Procedure note Diagnosis: Symptomatic seborrheic keratosis Procedure: Cryotherapy Location: Upper back  After discussion of the risks, benefits, and alternative therapies available, the patient elected to proceed. After obtaining written informed consent, the patient's identity, procedure, and site were verified during a time out prior to proceeding procedure. Two lesions on the upper back were treated using liquid nitrogen spray gun for 6 second per cycle, 3 cycles total. The patient tolerated the procedure well and there were no immediate complications.  Patient was provided aftercare handout and advised to return if lesion(s) did not fully resolved.   Tiffany Kocher, DO [facility]

## 2022-12-15 NOTE — Patient Instructions (Addendum)
It was great to see you! Thank you for allowing me to participate in your care!   After Cryotherapy Instructions: 1. Wound Cleaning: Clean the treated area gently with mild soap and water. Avoid using alcohol or hydrogen peroxide as these can delay healing. 2. Moisturization: Apply a thin layer of petroleum jelly to keep the wound moist and prevent scabbing. 3. Dressing: Cover the wound with a clean, non-stick bandage. Change the dressing daily or more frequently if it becomes wet or dirty. 4. Monitoring for Infection: Watch for signs of infection such as increased redness, swelling, warmth, pain, or discharge. If any of these symptoms occur, seek medical attention promptly. 5. Pain Management: Over-the-counter pain relievers like acetaminophen or ibuprofen can be used to manage discomfort. Cryotherapy can cause sustained vasoconstriction and tissue ischemia, which may contribute to pain. 6. Avoiding Trauma: Protect the treated area from trauma and avoid scratching or picking at the wound to prevent secondary infection and scarring   Take care and seek immediate care sooner if you develop any concerns. Please remember to show up 15 minutes before your scheduled appointment time!  Tiffany Kocher, DO [facility]

## 2022-12-18 ENCOUNTER — Encounter: Payer: Self-pay | Admitting: Student

## 2022-12-18 ENCOUNTER — Ambulatory Visit (INDEPENDENT_AMBULATORY_CARE_PROVIDER_SITE_OTHER): Payer: BC Managed Care – PPO | Admitting: Student

## 2022-12-18 VITALS — BP 136/74 | HR 51 | Ht 67.0 in | Wt 277.1 lb

## 2022-12-18 DIAGNOSIS — Z6841 Body Mass Index (BMI) 40.0 and over, adult: Secondary | ICD-10-CM

## 2022-12-18 MED ORDER — WEGOVY 0.25 MG/0.5ML ~~LOC~~ SOAJ: SUBCUTANEOUS | 0 refills | Status: AC

## 2022-12-18 NOTE — Progress Notes (Signed)
    SUBJECTIVE:   CHIEF COMPLAINT / HPI:   Weight loss management Patient follow-up from 11/13/2022.  She met one of her small goals of reducing carbohydrates by 50% instead replacing with protein.  She has increased her activity, but struggles to meet the full 30 minutes weekly due to exertional dyspnea (she sees pulmonology and cardiology).  Recently underwent low-dose CT which showed evidence of pulmonary hypertension, and may be contributing to her exercise intolerance and less her weight.  She is close to her goal of reducing caloric drinks by 50%. Appears her insurance may cover [phone] and patient is interested in trying this medication.  PERTINENT  PMH / PSH: Hypertension, hypothyroidism, MDD, asthma, hypercholesterolemia  OBJECTIVE:   BP 136/74   Pulse (!) 51   Ht 5\' 7"  (1.702 m)   Wt 277 lb 2 oz (125.7 kg)   SpO2 96%   BMI 43.40 kg/m    General: NAD, pleasant, Cardio: RRR, no MRG. Cap Refill <2s. Respiratory: CTAB, normal wob on RA Skin: Warm and dry  ASSESSMENT/PLAN:   OBESITY Patient met one of her smart goals as above.  Weight today 227, down from 229 earlier this week.  Interested in weight loss medication.  Phentermine and combinations contraindicated due to cardiac and respiratory disease.  Reginal Lutes would likely be greatest benefit for patient weight loss.  Message sent to patient's pulmonologist with regard to possible pulmonary hypertension as this may be contributing to her exercise intolerance. SMART goal set: Reduce caloric drinks by 50%, and walk 30 minutes 1 day/week. -Wegovy 0.25 mg weekly for 4 weeks, titrate to 0.5 mg weekly for 4 weeks -Continue lifestyle modifications -Follow-up in 4 weeks for ongoing weight management counseling  Tiffany Kocher, DO [facility] Medicine Center

## 2022-12-18 NOTE — Assessment & Plan Note (Addendum)
Patient met one of her smart goals as above.  Weight today 227, down from 229 earlier this week.  Interested in weight loss medication.  Phentermine and combinations contraindicated due to cardiac and respiratory disease.  Reginal Lutes would likely be greatest benefit for patient weight loss.  Message sent to patient's pulmonologist with regard to possible pulmonary hypertension as this may be contributing to her exercise intolerance. SMART goal set: Reduce caloric drinks by 50%, and walk 30 minutes 1 day/week. -Wegovy 0.25 mg weekly for 4 weeks, titrate to 0.5 mg weekly for 4 weeks -Continue lifestyle modifications -Follow-up in 4 weeks for ongoing weight management counseling

## 2022-12-18 NOTE — Patient Instructions (Addendum)
It was great to see you! Thank you for allowing me to participate in your care!   I recommend that you always bring your medications to each appointment as this makes it easy to ensure we are on the correct medications and helps [place] not miss when refills are needed.  Our plans for today:  - I have sent a prescription for [facility]. It starts at 0.25 mg once per week for 4 weeks. Then 0.5 mg once per week for 4 weeks. I would like you to see me monthly for ongoing weight loss management. - SMART goal set: Reduce caloric drinks by 50%, and walk 30 minutes 1 day/week. - I will send a message to Dr. Judeth Horn for suspected pulmonary hypertension, but feel free to contact his office on your own   Take care and seek immediate care sooner if you develop any concerns. Please remember to show up 15 minutes before your scheduled appointment time!  Tiffany Kocher, DO [facility]

## 2022-12-21 ENCOUNTER — Other Ambulatory Visit: Payer: Self-pay | Admitting: Cardiovascular Disease

## 2022-12-22 ENCOUNTER — Telehealth: Payer: Self-pay | Admitting: Student

## 2022-12-22 NOTE — Telephone Encounter (Signed)
Called patient, left HIPAA compliant VM.   If patient calls back, please inform her that Dr. Claudean Severance spoke with Dr. Judeth Horn and we would like to get an echocardiogram of her heart. If she is agreeable to getting the U/S I will have my office schedule her. If she would like to wait until she sees me again next month, that is okay too.

## 2022-12-22 NOTE — Telephone Encounter (Signed)
-----   Message from Lesia Sago [facility] sent at 12/18/2022  5:19 PM EDT ----- Regarding: RE: Possible Pulmonary HTN in mutual patient Deberah Castle,  If pulmonary HTN is on the differential, a good screening test would be an echocardiogram. Could also get a BNP.  If the echocardiogram is suggestive of pulmonary HTN then would move forward with a right heart cath. Let me know if I can help in any way.  Thanks! Matt ----- Message ----- From: Tiffany Kocher, DO Sent: 12/18/2022  11:10 AM EDT To: Karren Burly, MD Subject: Possible Pulmonary HTN in mutual patient       Hey Dr. Judeth Horn,  I am this patient's PCP at the [facility] residency clinic, and this patient is established with you. I recently ordered LDCT for lung cancer screening. No nodules, but did have evidence of enlarged pulmonic trunk. She still has exercise intolerance and dyspnea despite inhalers for her asthma and I wonder if this may be contributing. For my learning, what are the next steps in work-up?  Thanks, Tiffany Kocher, PGY-2

## 2022-12-24 ENCOUNTER — Telehealth: Payer: Self-pay

## 2022-12-24 NOTE — Telephone Encounter (Signed)
Pharmacy Patient Advocate Encounter   Received notification from CoverMyMeds that prior authorization for [facility] is required/requested.   Insurance verification completed.   The patient is insured through [employer] .   Per test claim: PA required; PA submitted to [employer] via CoverMyMeds Key/confirmation #/EOC [id number]. Status is pending.

## 2022-12-29 NOTE — Telephone Encounter (Signed)
Pharmacy Patient Advocate Encounter  Received notification from [facility] that Prior Authorization for [facility] has been APPROVED from 12/28/22 to 07/12/23

## 2023-01-10 ENCOUNTER — Other Ambulatory Visit: Payer: Self-pay | Admitting: Cardiovascular Disease

## 2023-01-13 ENCOUNTER — Telehealth: Payer: Self-pay | Admitting: Pulmonary Disease

## 2023-01-13 NOTE — Telephone Encounter (Signed)
PT needs a refill of Breo called in ASAP.  [facility] on [address]  Please call in 3 mo supply if poss it is much cheaper.

## 2023-01-15 MED ORDER — FLUTICASONE FUROATE-VILANTEROL 200-25 MCG/ACT IN AEPB: 1.0000 | INHALATION_SPRAY | Freq: Every day | RESPIRATORY_TRACT | 1 refills | Status: DC

## 2023-01-17 ENCOUNTER — Other Ambulatory Visit: Payer: Self-pay | Admitting: Student

## 2023-01-18 ENCOUNTER — Ambulatory Visit (INDEPENDENT_AMBULATORY_CARE_PROVIDER_SITE_OTHER): Payer: BC Managed Care – PPO | Admitting: Student

## 2023-01-18 ENCOUNTER — Encounter: Payer: Self-pay | Admitting: Student

## 2023-01-18 VITALS — BP 136/65 | HR 50 | Ht 67.0 in | Wt 278.2 lb

## 2023-01-18 DIAGNOSIS — R6889 Other general symptoms and signs: Secondary | ICD-10-CM | POA: Diagnosis not present

## 2023-01-18 DIAGNOSIS — Z23 Encounter for immunization: Secondary | ICD-10-CM | POA: Diagnosis not present

## 2023-01-18 DIAGNOSIS — E039 Hypothyroidism, unspecified: Secondary | ICD-10-CM

## 2023-01-18 DIAGNOSIS — Z6841 Body Mass Index (BMI) 40.0 and over, adult: Secondary | ICD-10-CM

## 2023-01-18 DIAGNOSIS — R7303 Prediabetes: Secondary | ICD-10-CM

## 2023-01-18 DIAGNOSIS — E66813 Obesity, class 3: Secondary | ICD-10-CM

## 2023-01-18 LAB — POCT GLYCOSYLATED HEMOGLOBIN (HGB A1C): Hemoglobin A1C: 6.1 % — AB (ref 4.0–5.6)

## 2023-01-18 MED ORDER — FLUTICASONE FUROATE-VILANTEROL 200-25 MCG/ACT IN AEPB: 1.0000 | INHALATION_SPRAY | Freq: Every day | RESPIRATORY_TRACT | 1 refills | Status: DC

## 2023-01-18 MED ORDER — METFORMIN HCL ER 500 MG PO TB24: 500.0000 mg | ORAL_TABLET | Freq: Every day | ORAL | 3 refills | Status: DC

## 2023-01-18 MED ORDER — LEVOTHYROXINE SODIUM 100 MCG PO TABS
100.0000 ug | ORAL_TABLET | Freq: Every day | ORAL | 1 refills | Status: DC
Start: 2023-01-18 — End: 2023-04-19

## 2023-01-18 NOTE — Progress Notes (Signed)
    SUBJECTIVE:   CHIEF COMPLAINT / HPI:   Weight loss management Patient presents for routine follow-up of weight loss management.  Unable to obtain Wegovy 2/2 cost, insurance will cover approximately 60% of medication and is still several hundred dollars.  Patient continues to work towards lifestyle modifications.  She continues to have exercise intolerance, secondary to dyspnea.  PERTINENT  PMH / PSH: Unstable angina, hypertension, GERD, hypothyroidism, MDD, asthma  OBJECTIVE:   BP 136/65   Pulse (!) 50   Ht 5\' 7"  (1.702 m)   Wt 278 lb 3.2 oz (126.2 kg)   SpO2 95%   BMI 43.57 kg/m    General: NAD, pleasant Cardio: RRR, no MRG. Cap Refill <2s. Respiratory: CTAB, prolonged expiratory phase, normal work of breathing on room air. GI: Abdomen is soft, not tender, not distended. BS present Skin: Warm and dry   ASSESSMENT/PLAN:   Assessment & Plan Class 3 severe obesity with body mass index (BMI) of 40.0 to 44.9 in adult, unspecified obesity type, unspecified whether serious comorbidity present [facility]) Patient continues to work on Psychiatrist.  Weight today 228, up from 227 prior visit.  Wegovy not affordable, which is unfortunate.  Advised against phentermine and combination 2/2 cardiac/respiratory disease.  Exercise intolerance is barrier to weight loss (see below). SMART goal set: Reduce caloric drinks by 50%, and walk 30 minutes 1 day/week.  -Follow-up in 4 weeks Prediabetes Last A1c within prediabetic range.  A1c today 6.1.  Will prescribe metformin to assist with weight loss and prevent development of type 2 diabetes. -Start metformin 500 mg daily Exercise intolerance Multiple risk factors including CAD, asthma, obesity.  CT with evidence of enlarged pulmonary trunk-will obtain screening echo for pulmonary hypertension. - Echocardiogram - Continue inhaler medications Encounter for immunization Flu and COVID vaccination given today.    Tiffany Kocher, DO [facility]  [facility] Medicine Center

## 2023-01-18 NOTE — Patient Instructions (Addendum)
It was great to see you! Thank you for allowing me to participate in your care!   I recommend that you always bring your medications to each appointment as this makes it easy to ensure we are on the correct medications and helps [place] not miss when refills are needed.  Our plans for today:  - Continue to reduce caloric drinks by 50%. Continue to work toward 30 min of exercise per week. - Depending on labs we may start other medications - We will schedule an Echo of your heart and then call you with the date and time  We are checking some labs today, I will call you if they are abnormal will send you a MyChart message or a letter if they are normal.  If you do not hear about your labs in the next 2 weeks please let us know.  Take care and seek immediate care sooner if you develop any concerns. Please remember to show up 15 minutes before your scheduled appointment time!  Tiffany Kocher, DO [facility] Family Medicine

## 2023-01-18 NOTE — Assessment & Plan Note (Addendum)
Patient continues to work on Psychiatrist.  Weight today 228, up from 227 prior visit.  Wegovy not affordable, which is unfortunate.  Advised against phentermine and combination 2/2 cardiac/respiratory disease.  Exercise intolerance is barrier to weight loss (see below). SMART goal set: Reduce caloric drinks by 50%, and walk 30 minutes 1 day/week.  -Follow-up in 4 weeks

## 2023-01-19 ENCOUNTER — Ambulatory Visit (HOSPITAL_COMMUNITY): Payer: BC Managed Care – PPO | Attending: Cardiology

## 2023-01-19 DIAGNOSIS — R6889 Other general symptoms and signs: Secondary | ICD-10-CM | POA: Diagnosis not present

## 2023-01-19 DIAGNOSIS — I340 Nonrheumatic mitral (valve) insufficiency: Secondary | ICD-10-CM | POA: Diagnosis not present

## 2023-01-19 LAB — ECHOCARDIOGRAM COMPLETE
AR max vel: 2.29 cm2
AV Area VTI: 2.23 cm2
AV Area mean vel: 2.14 cm2
AV Mean grad: 9 mm[Hg]
AV Peak grad: 15.9 mm[Hg]
Ao pk vel: 2 m/s
Area-P 1/2: 2.66 cm2
MV VTI: 1.57 cm2
P 1/2 time: 721 ms
S' Lateral: 3.5 cm

## 2023-01-23 ENCOUNTER — Other Ambulatory Visit: Payer: Self-pay | Admitting: Student

## 2023-01-23 DIAGNOSIS — G629 Polyneuropathy, unspecified: Secondary | ICD-10-CM

## 2023-01-26 ENCOUNTER — Telehealth: Payer: Self-pay | Admitting: Student

## 2023-01-26 NOTE — Telephone Encounter (Signed)
Patient returns call to nurse line. Advised of message per Dr. Claudean Severance.   She will follow up with PCP in clinic on 02/16/2023.  Veronda Prude, RN

## 2023-01-26 NOTE — Telephone Encounter (Signed)
Called patient. No answer. Left HIPAA compliant voicemail.  When patient returns call, please inform her the echocardiogram did not show evidence of pulmonary hypertension. There are minor findings I will be happy to discuss at our next visit in person, but there are no urgent or emergent findings.  If she has any questions or concerns, I will be happy to return her call as soon as possible.

## 2023-01-28 ENCOUNTER — Other Ambulatory Visit: Payer: Self-pay | Admitting: Student

## 2023-02-02 DIAGNOSIS — Z1231 Encounter for screening mammogram for malignant neoplasm of breast: Secondary | ICD-10-CM | POA: Diagnosis not present

## 2023-02-04 ENCOUNTER — Other Ambulatory Visit: Payer: Self-pay | Admitting: Student

## 2023-02-04 DIAGNOSIS — F330 Major depressive disorder, recurrent, mild: Secondary | ICD-10-CM

## 2023-02-06 ENCOUNTER — Other Ambulatory Visit: Payer: Self-pay | Admitting: Cardiovascular Disease

## 2023-02-11 ENCOUNTER — Encounter: Payer: Self-pay | Admitting: Student

## 2023-02-11 IMAGING — DX DG KNEE COMPLETE 4+V*R*
4 series · 4 of 4 positions shown · non-contrast
Comparison: No priors.

CLINICAL DATA: 60-year-old female with history of trauma from a
fall today at work. Right knee pain.

EXAM:
RIGHT KNEE - COMPLETE 4+ VIEW

[knee ap]
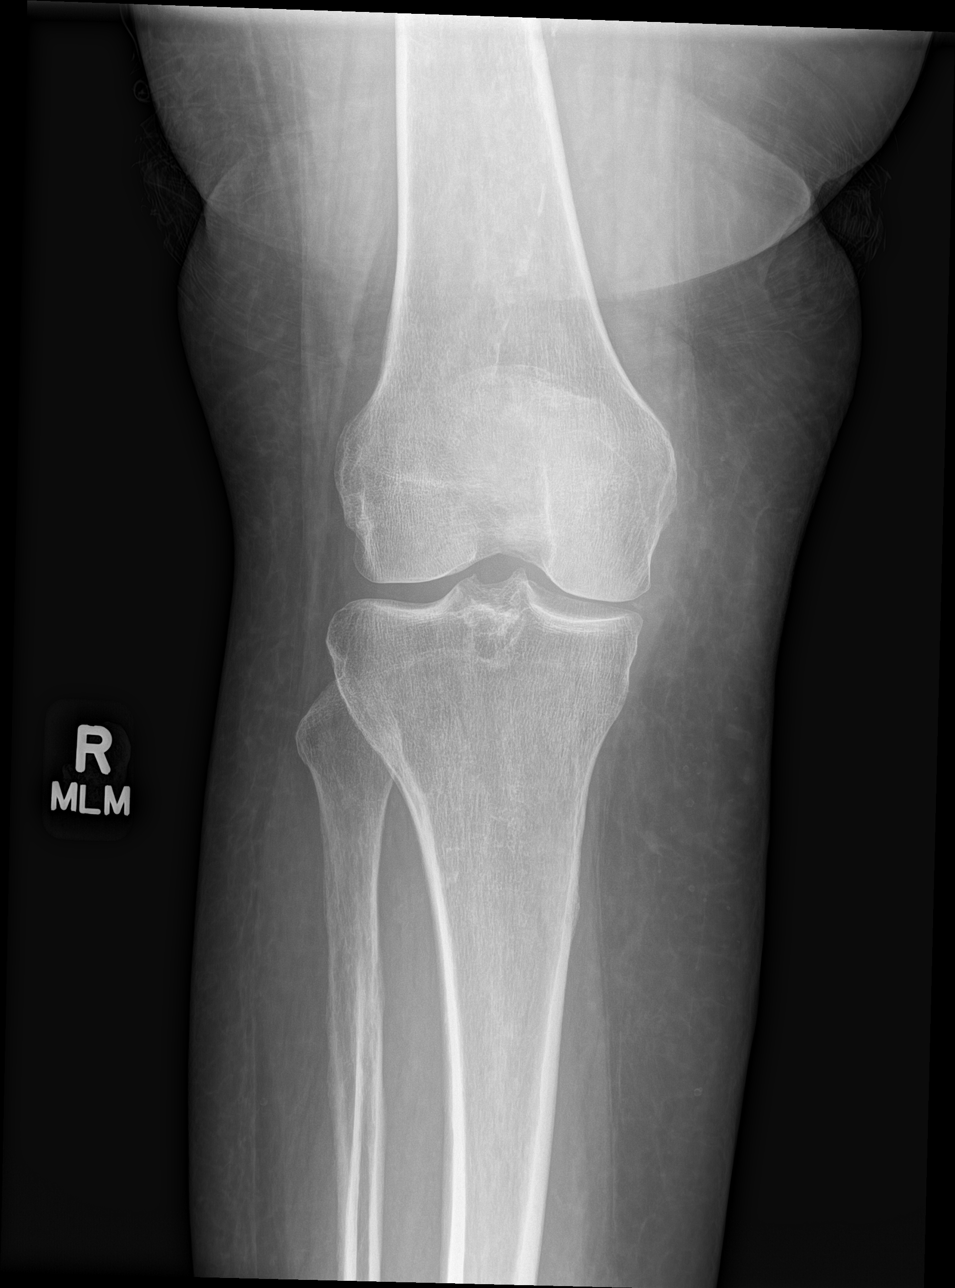

[knee obl (1 of 2)]
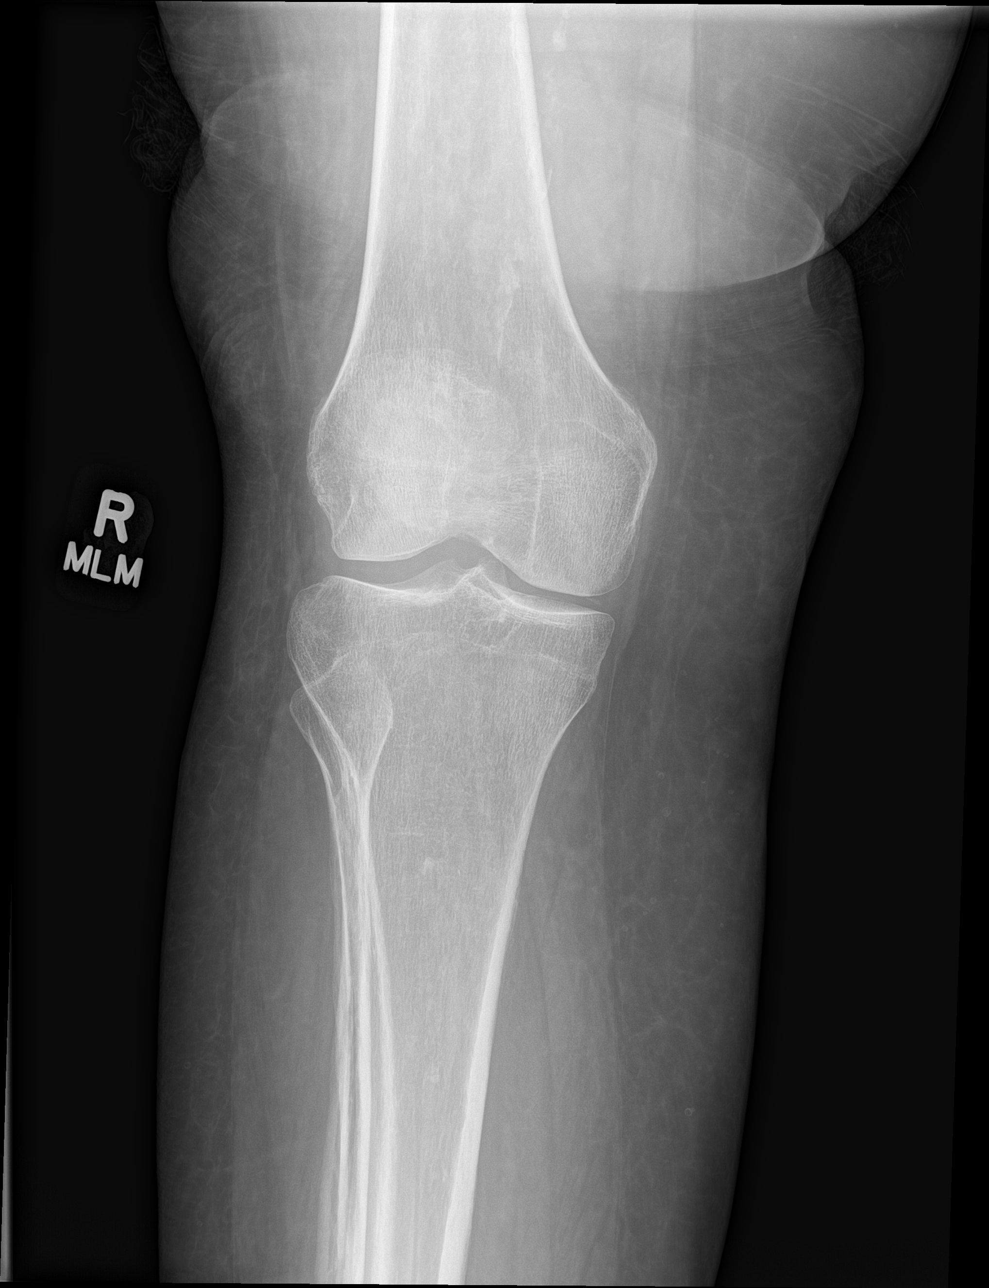

[knee obl (2 of 2)]
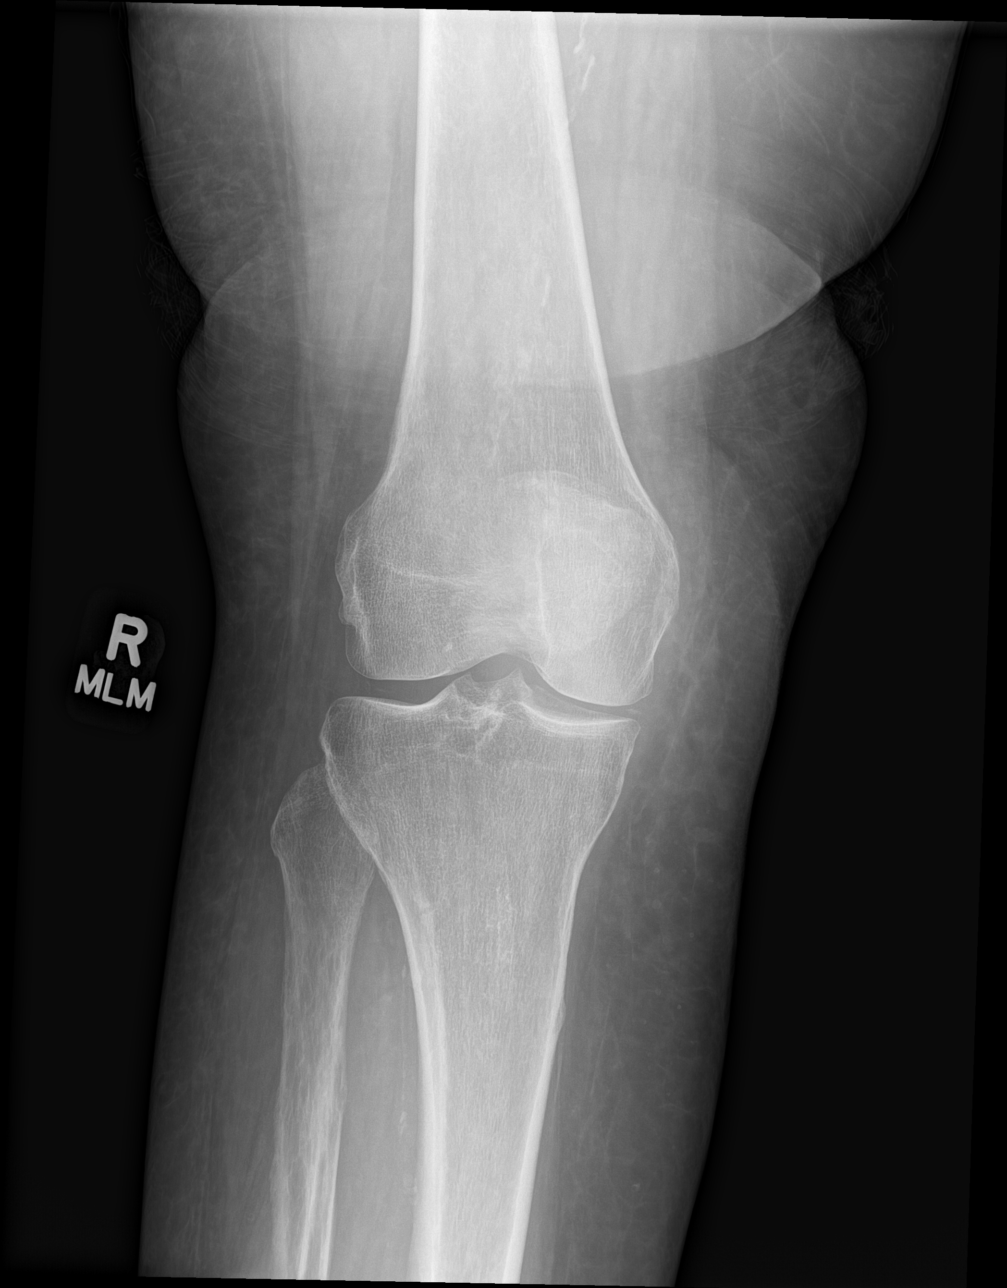

[knee lat]
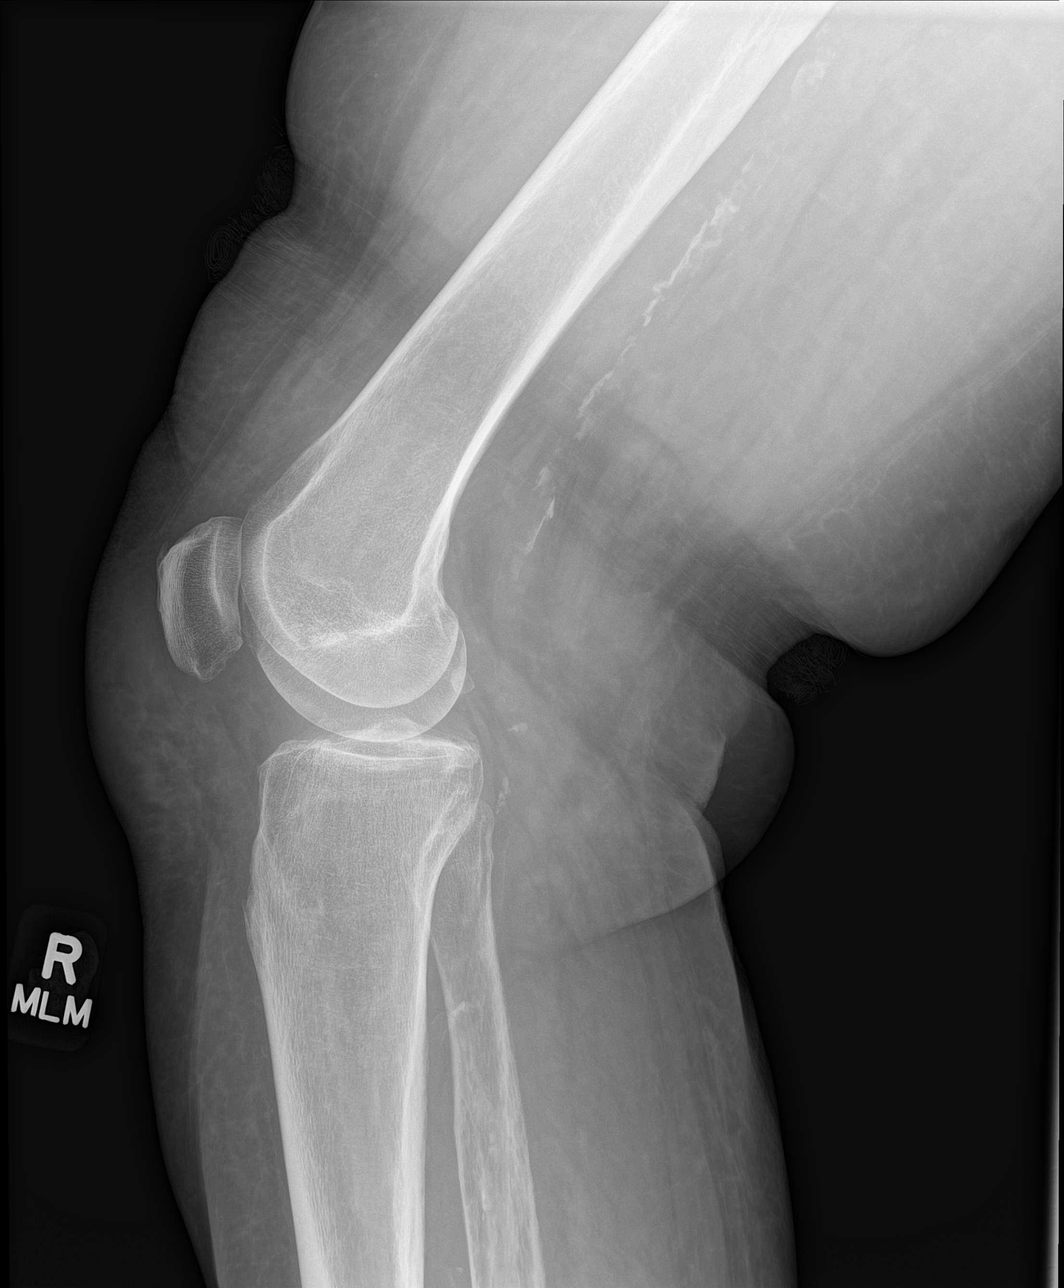

[4 of 4 positions shown; findings below may reference images not displayed]

FINDINGS: Four views of the right knee demonstrate no acute displaced
fracture, subluxation or dislocation. There is some joint space
narrowing, subchondral sclerosis and osteophyte formation in a
tricompartmental distribution, most severe in the medial and
patellofemoral compartments, compatible with osteoarthritis.
Numerous vascular calcifications are also noted.
IMPRESSION: 1. No acute radiographic abnormality of the right knee.
2. Tricompartmental osteoarthritis, most severe in the medial and
patellofemoral compartments.
3. Atherosclerosis.

## 2023-02-11 IMAGING — DX DG FINGER THUMB 2+V*L*
3 series · 3 of 3 positions shown · non-contrast
Comparison: Left wrist radiographs 10/16/2020

CLINICAL DATA: Fall, left thumb pain

EXAM:
LEFT THUMB 2+V

[finger ap]
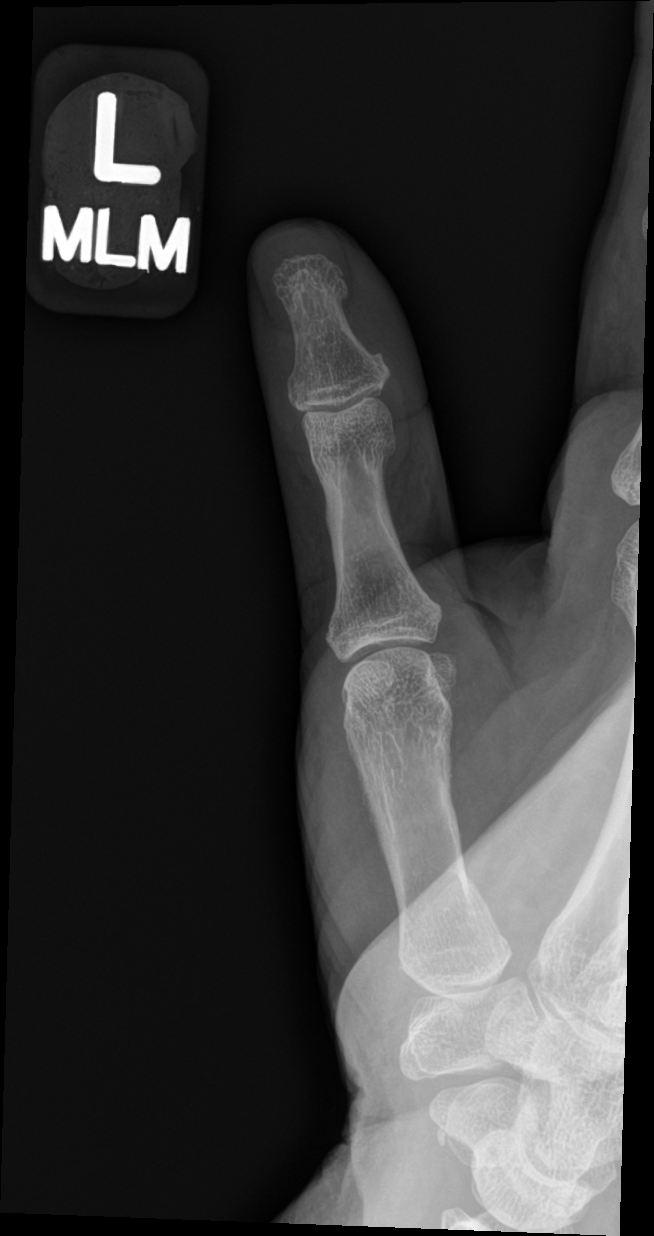

[finger obl]
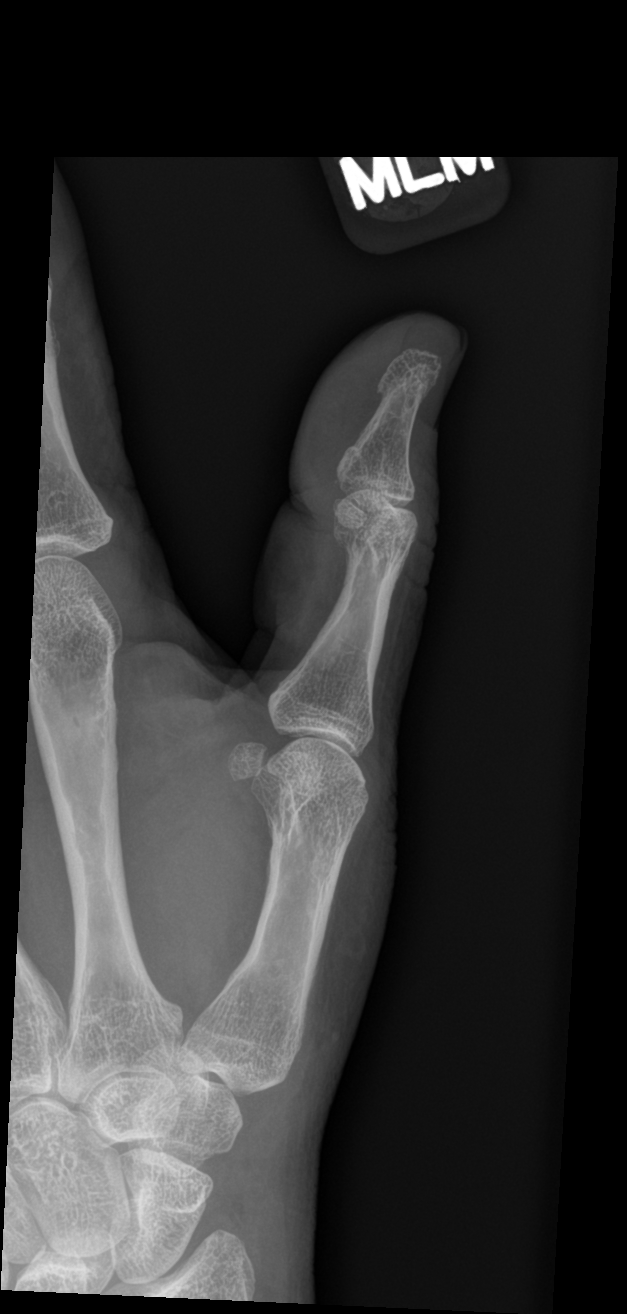

[finger lat]
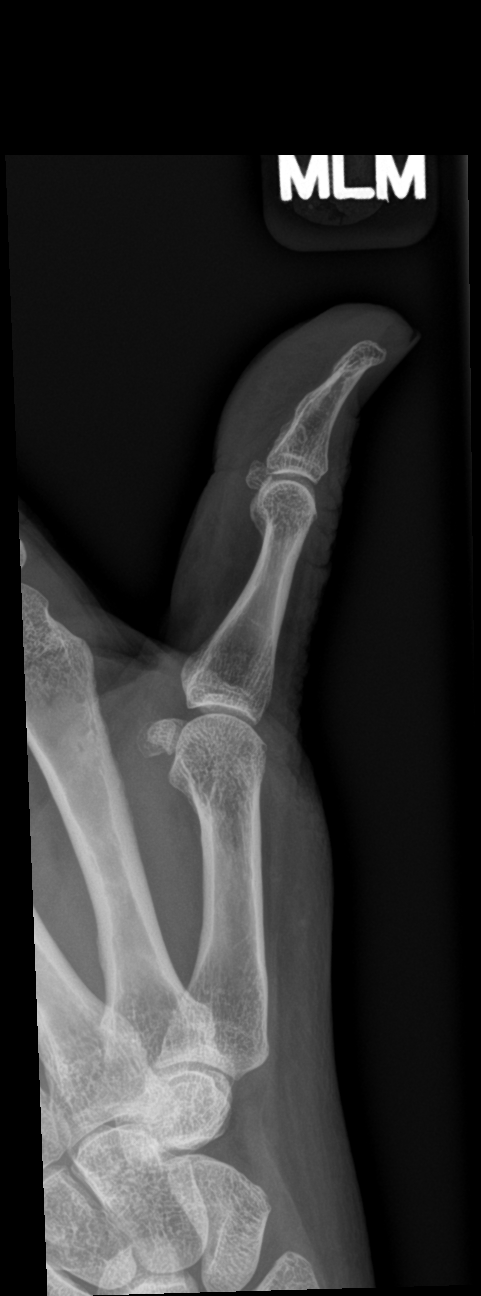

[3 of 3 positions shown; findings below may reference images not displayed]

FINDINGS: There is no acute fracture or dislocation. Alignment is normal. The
joint spaces are preserved. The soft tissues are unremarkable.
IMPRESSION: No acute fracture or dislocation.

## 2023-02-16 ENCOUNTER — Ambulatory Visit
Admission: RE | Admit: 2023-02-16 | Discharge: 2023-02-16 | Disposition: A | Discharge status: Home / Self Care | Payer: BC Managed Care – PPO | Source: Ambulatory Visit | Attending: Family Medicine | Admitting: Family Medicine

## 2023-02-16 ENCOUNTER — Ambulatory Visit (INDEPENDENT_AMBULATORY_CARE_PROVIDER_SITE_OTHER): Payer: BC Managed Care – PPO | Admitting: Student

## 2023-02-16 ENCOUNTER — Encounter: Payer: Self-pay | Admitting: Student

## 2023-02-16 VITALS — BP 170/73 | HR 51 | Ht 67.0 in | Wt 276.6 lb

## 2023-02-16 DIAGNOSIS — M79672 Pain in left foot: Secondary | ICD-10-CM | POA: Diagnosis not present

## 2023-02-16 DIAGNOSIS — S99922A Unspecified injury of left foot, initial encounter: Secondary | ICD-10-CM | POA: Diagnosis not present

## 2023-02-16 NOTE — Progress Notes (Signed)
    SUBJECTIVE:   CHIEF COMPLAINT / HPI:   Foot pain Around 8 AM this morning, patient dropped a metal cup onto left foot.  Injury occurred 2 fifth digit and distal metatarsal.  Painful and swollen.  No prior surgeries or fractures on this foot.  She is able to weight-bear although it is painful.  No injury to her ankle.  OBJECTIVE:   BP (!) 170/73   Pulse (!) 51   Ht 5\' 7"  (1.702 m)   Wt 276 lb 9.6 oz (125.5 kg)   SpO2 98%   BMI 43.32 kg/m    General: NAD, well-appearing, well-nourished Respiratory: No respiratory distress, breathing comfortably, able to speak in full sentences Skin: warm and dry, no rashes noted on exposed skin Psych: Appropriate affect and mood MSK: No gross deformity, mild ecchymosis and swelling over fifth digit and distal fifth metatarsal.  TTP over fifth digit and distal metatarsal.  Reduced flexion and extension of fifth digit.  Neurovascularly intact.  Full range of motion in ankle, no tenderness in ankle.  ASSESSMENT/PLAN:   Assessment & Plan Left foot pain Suspect nondisplaced fracture of lesser toe v.  soft tissue injury.  Will obtain x-ray to rule out involvement of fifth metatarsal.  - RICE therapy - OTC analgesics - Hard sole shoe - Follow-up x-ray  Tiffany Kocher, DO [facility] Medicine Center

## 2023-02-16 NOTE — Patient Instructions (Signed)
It was great to see you! Thank you for allowing me to participate in your care!   I recommend that you always bring your medications to each appointment as this makes it easy to ensure we are on the correct medications and helps [place] not miss when refills are needed.  Our plans for today:  - Use tylenol or ibuprofen for pain - Ice, rest, and elevation of foot - Hard sole shoe  An x-ray was ordered for you---you do not need an appointment to have this completed.  I recommend going to [facility] [address] [place]  If the results are normal,I will send you a letter  I will call you with results if anything is abnormal   Take care and seek immediate care sooner if you develop any concerns. Please remember to show up 15 minutes before your scheduled appointment time!  Tiffany Kocher, DO [facility] Medicine

## 2023-02-16 NOTE — Progress Notes (Deleted)
    SUBJECTIVE:   CHIEF COMPLAINT / HPI:   ***  PERTINENT  PMH / PSH: ***  OBJECTIVE:   BP (!) 158/72   Pulse (!) 51   Ht 5\' 7"  (1.702 m)   Wt 276 lb 9.6 oz (125.5 kg)   SpO2 98%   BMI 43.32 kg/m   ***  ASSESSMENT/PLAN:   No problem-specific Assessment & Plan notes found for this encounter.     Tiffany Kocher, DO [facility] Medicine Center

## 2023-02-18 ENCOUNTER — Ambulatory Visit: Payer: BC Managed Care – PPO | Admitting: Family Medicine

## 2023-02-20 ENCOUNTER — Other Ambulatory Visit: Payer: Self-pay | Admitting: Cardiovascular Disease

## 2023-02-25 ENCOUNTER — Encounter: Payer: Self-pay | Admitting: Student

## 2023-02-25 ENCOUNTER — Ambulatory Visit: Payer: BC Managed Care – PPO | Admitting: Student

## 2023-02-25 VITALS — BP 140/69 | HR 83 | Ht 67.0 in | Wt 276.0 lb

## 2023-02-25 DIAGNOSIS — H9201 Otalgia, right ear: Secondary | ICD-10-CM

## 2023-02-25 NOTE — Progress Notes (Signed)
Cardiology Office Note    Date:  02/26/2023  ID:  Bethany Beard, DOB 22-Sep-1960, MRN [id number] PCP:  Bethany Kocher, DO  Cardiologist:  Bethany Harps, MD  Electrophysiologist:  None   Chief Complaint: Follow up for CAD   History of Present Illness: Bethany Beard is a 62 y.o. female with visit-pertinent history of CAD being medically managed, HTN, HLD, HFpEF, PVCs.   Cardiac catheterization on 12/09/2020 showed severe heavily calcified proximal and mid RCA stenosis, mild eccentric distal left main stenosis, mild ostial LAD stenosis, severe proximal circumflex stenosis.  Noted to have complex multivessel CAD, RCA could have potentially been treated percutaneously but would require atherectomy and a long segment of stenting from the ostium through the mid to distal and would have been high risk for complications. It was recommended that she have medical management.   She was last seen in clinic on 06/15/2022 by Dr. Bufford Beard.  She was noted to have a new right carotid bruit. Carotid duplex showed bilateral 1 to 39% stenosis. Echo on 01/19/2023 indicated LVEF of 60 to 65%, LV with normal function, no RWMA, grade 1 diastolic dysfunction, mild mitral valve regurgitation, aortic valve sclerosis was present without any evidence of stenosis.  Today she presents for follow up, she reports that she has been doing well.  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  She does note some mild bilateral lower extremity edema at the end of the day, resolves overnight.  She notes rare episodes of slight chest discomfort, quickly resolves with rest. She denies any nitroglycerin use.  Reports compliance with all medications.  She does report some recent intermittent dizziness related to a current ear infection.  Patient notes she plans to have an annual physical and labs with her PCP soon.   Labwork independently  reviewed: 06/15/2022: Sodium 141, potassium 4.8, creatinine 0.92, AST 11, ALT 9, hemoglobin 11.4, hematocrit 37.4, TSH 1.6  ROS: .   Today she denies shortness of breath, lower extremity edema, fatigue, palpitations, melena, hematuria, hemoptysis, diaphoresis, weakness, presyncope, syncope, orthopnea, and PND.  All other systems are reviewed and otherwise negative. Studies Reviewed: Marland Kitchen    EKG:  EKG is ordered today, personally reviewed, demonstrating  EKG Interpretation Date/Time:  Friday February 26 2023 10:59:36 EST Ventricular Rate:  49 PR Interval:  180 QRS Duration:  88 QT Interval:  442 QTC Calculation: 399 R Axis:   28  Text Interpretation: Sinus bradycardia When compared with ECG of 17-Sep-2020 15:14, No significant change was found Confirmed by Reather Littler [id number]) on 02/26/2023 12:40:13 PM    CV Studies:  Cardiac Studies & Procedures   CARDIAC CATHETERIZATION  CARDIAC CATHETERIZATION 12/11/2020  Narrative   Dist LM to Prox LAD lesion is 50% stenosed.   Mid LM to Dist LM lesion is 40% stenosed.   Ost Cx to Prox Cx lesion is 70% stenosed.   Ramus lesion is 60% stenosed.   Mid Cx to Dist Cx lesion is 95% stenosed.   Prox RCA lesion is 80% stenosed.   Prox RCA to Mid RCA lesion is 99% stenosed.   Mid RCA lesion is 99% stenosed.   RPAV lesion is 100% stenosed.  Severe heavily calcified proximal and mid RCA stenosis. Probable chronic occlusion of a posterolateral branch that is seen to fill from left to right collaterals. The PDA has  competitive flow from left to right collaterals. Mild eccentric distal left main stenosis Mild ostial LAD stenosis. The remainder of the LAD has no significant disease. Severe proximal Circumflex stenosis. Moderately severe stenosis of the ostium of the moderate caliber obtuse marginal branch. Severe mid Circumflex stenosis. Elevated LVEDP  Recommendations: She has complex multi-vessel CAD. The RCA can be treated percutaneously but this will  require atherectomy and a long segment of stenting from the ostium through the mid to distal vessel. This would be high risk for complications given the appearance of the vessel and heavy calcification. Stenting of the proximal Circumflex may compromise flow into the high obtuse marginal branch and potentially the LAD given the distal left main stenosis and ostial LAD plaque. The Circumflex is overall small and diffusely diseased and not a good target for CABG.  After review with the IC team, we will plan medical management of her CAD for now. I will add Plavix and Imdur. Continue beta blocker. If she has lifestyle limiting angina, we can consider high risk PCI of the RCA with orbital atherectomy and stenting.  Will diurese with IV Lasix today given elevated filling pressures.  Findings Coronary Findings Diagnostic  Dominance: Right  Left Main Mid LM to Dist LM lesion is 40% stenosed. The lesion is eccentric. Dist LM to Prox LAD lesion is 50% stenosed.  Ramus Intermedius Ramus lesion is 60% stenosed.  Left Circumflex Ost Cx to Prox Cx lesion is 70% stenosed. Mid Cx to Dist Cx lesion is 95% stenosed.  Right Coronary Artery Vessel is large. Prox RCA lesion is 80% stenosed. The lesion is calcified. Prox RCA to Mid RCA lesion is 99% stenosed. Mid RCA lesion is 99% stenosed.  Right Posterior Atrioventricular Artery RPAV lesion is 100% stenosed. The lesion is chronically occluded.  Third Right Posterolateral Branch Collaterals 3rd RPL filled by collaterals from 2nd Sept.  Intervention  No interventions have been documented.     ECHOCARDIOGRAM  ECHOCARDIOGRAM COMPLETE 01/19/2023  Narrative ECHOCARDIOGRAM REPORT    Patient Name:   Bethany Beard Date of Exam: 01/19/2023 Medical Rec #:  [id number]     Height:       67.0 in Accession #:    [id number]    Weight:       278.2 lb Date of Birth:  04-26-1960     BSA:          2.327 m Patient Age:    62 years      BP:           136/65  mmHg Patient Gender: F             HR:           48 bpm. Exam Location:  [address]  Procedure: 2D Echo, 3D Echo, Cardiac Doppler and Color Doppler  Indications:    R06.00 Dyspnea  History:        Patient has prior history of Echocardiogram examinations, most recent 12/04/2020. CAD, Signs/Symptoms:Dyspnea and Chest Pain; Risk Factors:Hypertension, Dyslipidemia and Former Smoker. Obesity.  Sonographer:    Farrel Conners RDCS Referring Phys: Bethany Beard  IMPRESSIONS   1. Left ventricular ejection fraction, by estimation, is 60 to 65%. The left ventricle has normal function. The left ventricle has no regional wall motion abnormalities. Left ventricular diastolic parameters are consistent with Grade I diastolic dysfunction (impaired relaxation). 2. Right ventricular systolic function is normal. The right ventricular size is normal. 3. The mitral valve is normal in structure. Mild mitral valve  regurgitation. No evidence of mitral stenosis. 4. The aortic valve is tricuspid. There is mild calcification of the aortic valve. Aortic valve regurgitation is mild. Aortic valve sclerosis/calcification is present, without any evidence of aortic stenosis. 5. The inferior vena cava is normal in size with greater than 50% respiratory variability, suggesting right atrial pressure of 3 mmHg.  FINDINGS Left Ventricle: Left ventricular ejection fraction, by estimation, is 60 to 65%. The left ventricle has normal function. The left ventricle has no regional wall motion abnormalities. The left ventricular internal cavity size was normal in size. There is no left ventricular hypertrophy. Left ventricular diastolic parameters are consistent with Grade I diastolic dysfunction (impaired relaxation).  Right Ventricle: The right ventricular size is normal. No increase in right ventricular wall thickness. Right ventricular systolic function is normal.  Left Atrium: Left atrial size was normal in  size.  Right Atrium: Right atrial size was normal in size.  Pericardium: There is no evidence of pericardial effusion.  Mitral Valve: The mitral valve is normal in structure. Mild mitral valve regurgitation. No evidence of mitral valve stenosis. MV peak gradient, 9.4 mmHg. The mean mitral valve gradient is 2.0 mmHg.  Tricuspid Valve: The tricuspid valve is normal in structure. Tricuspid valve regurgitation is trivial. No evidence of tricuspid stenosis.  Aortic Valve: The aortic valve is tricuspid. There is mild calcification of the aortic valve. Aortic valve regurgitation is mild. Aortic regurgitation PHT measures 721 msec. Aortic valve sclerosis/calcification is present, without any evidence of aortic stenosis. Aortic valve mean gradient measures 9.0 mmHg. Aortic valve peak gradient measures 15.9 mmHg. Aortic valve area, by VTI measures 2.23 cm.  Pulmonic Valve: The pulmonic valve was normal in structure. Pulmonic valve regurgitation is not visualized. No evidence of pulmonic stenosis.  Aorta: The aortic root is normal in size and structure.  Venous: The inferior vena cava is normal in size with greater than 50% respiratory variability, suggesting right atrial pressure of 3 mmHg.  IAS/Shunts: No atrial level shunt detected by color flow Doppler.   LEFT VENTRICLE PLAX 2D LVIDd:         5.70 cm   Diastology LVIDs:         3.50 cm   LV e' medial:    9.17 cm/s LV PW:         0.80 cm   LV E/e' medial:  11.9 LV IVS:        0.90 cm   LV e' lateral:   11.70 cm/s LVOT diam:     2.30 cm   LV E/e' lateral: 9.3 LV SV:         114 LV SV Index:   49 LVOT Area:     4.15 cm  3D Volume EF: 3D EF:        56 % LV EDV:       108 ml LV ESV:       47 ml LV SV:        61 ml  RIGHT VENTRICLE RV Basal diam:  3.60 cm RV S prime:     17.20 cm/s TAPSE (M-mode): 3.0 cm  LEFT ATRIUM             Index        RIGHT ATRIUM           Index LA diam:        3.70 cm 1.59 cm/m   RA Pressure: 3.00 mmHg LA  Vol (A2C):   71.9 ml 30.90 ml/m  RA Area:     24.50 cm LA Vol (A4C):   54.1 ml 23.25 ml/m  RA Volume:   79.50 ml  34.17 ml/m LA Biplane Vol: 62.6 ml 26.90 ml/m AORTIC VALVE AV Area (Vmax):    2.29 cm AV Area (Vmean):   2.14 cm AV Area (VTI):     2.23 cm AV Vmax:           199.67 cm/s AV Vmean:          143.000 cm/s AV VTI:            0.510 m AV Peak Grad:      15.9 mmHg AV Mean Grad:      9.0 mmHg LVOT Vmax:         110.00 cm/s LVOT Vmean:        73.600 cm/s LVOT VTI:          0.274 m LVOT/AV VTI ratio: 0.54 AI PHT:            721 msec  AORTA Ao Root diam: 3.10 cm Ao Asc diam:  3.00 cm  MITRAL VALVE                TRICUSPID VALVE MV Area (PHT): cm          Estimated RAP:  3.00 mmHg MV Area VTI:   1.57 cm MV Peak grad:  9.4 mmHg     SHUNTS MV Mean grad:  2.0 mmHg     Systemic VTI:  0.27 m MV Vmax:       1.53 m/s     Systemic Diam: 2.30 cm MV Vmean:      57.9 cm/s MV Decel Time: 285 msec MV E velocity: 109.00 cm/s MV A velocity: 104.00 cm/s MV E/A ratio:  1.05  Arvilla Meres MD Electronically signed by Arvilla Meres MD Signature Date/Time: 01/19/2023/2:21:14 PM    Final    MONITORS  LONG TERM MONITOR (3-14 DAYS) 04/08/2021  Narrative Enrollment 03/30/2021-04/02/2021 (2 days 21 hours). Patient had a min HR of 38 bpm (sinus bradycardia), max HR of 154 bpm (4 beats of non-sustained ventricular tachycardia, 2.6 second duration), and avg HR of 65 bpm (normal sinus rhythm). Predominant underlying rhythm was Sinus Rhythm. Slight P wave morphology changes were noted. 1 run of non-sustained Ventricular Tachycardia occurred lasting 4 beats (2.6 second duration) with a max rate of 154 bpm (avg 136 bpm). 9 Supraventricular Tachycardia runs occurred, the run with the fastest interval lasting 7 beats (3.1 second duration) with a max rate of 143 bpm, the longest lasting 13 beats (7.4 second duration) with an avg rate of 106 bpm. Some episodes of Supraventricular  Tachycardia may be possible Atrial Tachycardia with variable block. Idioventricular Rhythm was present. Isolated SVEs were occasional (4.2%, 11574), SVE Couplets were rare (<1.0%, 166), and SVE Triplets were rare (<1.0%, 39). Isolated VEs were frequent (16.7%, 45950), VE Couplets were rare (<1.0%, 827), and VE Triplets were rare (<1.0%, 38). Ventricular Bigeminy and Trigeminy were present.  Impression: 1. Frequent PVCs (16.7% burden). 2. Occassional PACs (4.2% burden). 3. Brief ectopic atrial tachycardia episodes detected (9 episodes in 3 days; longest episode 7.4 seconds).  Gerri Spore T. Flora Lipps, MD, [facility] [address], [address] [place], [place] [address] [phone] 1:00 PM   CT SCANS  CT CORONARY MORPH W/CTA COR W/SCORE 11/28/2020  Addendum 11/28/2020  1:08 PM ADDENDUM REPORT: 11/28/2020 13:06  CLINICAL DATA:  61 year old female with chest pain, LDL  171, smoker, HTN, adopted with BMI 46.  EXAM: Cardiac/Coronary  CTA  TECHNIQUE: The patient was scanned on a [employer].  FINDINGS: A 130 kV prospective scan was triggered in the descending thoracic aorta at 111 HU's. Axial non-contrast 3 mm slices were carried out through the heart. The data set was analyzed on a dedicated work station and scored using the Agatson method. Gantry rotation speed was 250 msecs and collimation was .6 mm. No beta blockade and 0.8 mg of sl NTG was given. The 3D data set was reconstructed in 5% intervals of the 67-82 % of the R-R cycle. Diastolic phases were analyzed on a dedicated work station using MPR, MIP and VRT modes. The patient received 80 cc of contrast.  Image quality is poor. Coronary arteries appear suboptimally filled but this may be secondary to morbid obesity.  Aorta:  Normal size.  Mild calcifications.  No dissection.  Aortic Valve:  Trileaflet.  No calcifications.  Coronary Arteries:  Normal coronary origin.  Right dominance.  RCA is a  large dominant artery that gives rise to PDA and PLA. There is severe proximal to mid calcified plaque with areas of possible significant stenosis. Poor coronary filling.  Left main is a large artery that gives rise to LAD, Ramus and LCX arteries. There is distal left main small focal calcified plaque, 0-24%, non flow limiting.  LAD had diffuse proximal calcified and non calcified plaque. There is 50% narrowing of the ostial region. There is proximal calcified plaque (14 mm in length) 0-24% stenosis. Sending for FFR.  LCX is a non-dominant artery that gives rise to one large OM1 branch. There is dense proximal calcified plaque. Vessel is poorly filled.  There is a ramus branch with spotty calcified plaque proximally. Portions may be flow limiting.  Other findings:  Normal pulmonary vein drainage into the left atrium.  Normal left atrial appendage without a thrombus.  Mildly dilated pulmonary artery, 30 mm.  Please see radiology report for non cardiac findings.  IMPRESSION: 1. Coronary calcium score of 1299. This was 87 percentile for age and sex matched control.  2. Normal coronary origin with right dominance.  3. Diffuse CAD. Sending for FFR if able to process. Suboptimal study. Recommend cardiac catheterization.   Electronically Signed By: Donato Schultz MD On: 11/28/2020 13:06  Narrative EXAM: OVER-READ INTERPRETATION  CT CHEST  The following report is an over-read performed by radiologist Dr. Trudie Reed of [facility] on 11/28/2020. This over-read does not include interpretation of cardiac or coronary anatomy or pathology. The coronary calcium score/coronary CTA interpretation by the cardiologist is attached.  COMPARISON:  None.  FINDINGS: Atherosclerotic calcifications in the thoracic aorta. Within the visualized portions of the thorax there are no suspicious appearing pulmonary nodules or masses, there is no acute consolidative airspace  disease, no pleural effusions, no pneumothorax and no lymphadenopathy. Visualized portions of the upper abdomen are unremarkable. There are no aggressive appearing lytic or blastic lesions noted in the visualized portions of the skeleton.  IMPRESSION: 1.  Aortic Atherosclerosis (ICD10-I70.0).  Electronically Signed: By: Trudie Reed M.D. On: 11/28/2020 12:29          Current Reported Medications:.    Current Meds  Medication Sig   acetaminophen (TYLENOL) 650 MG CR tablet Take 1,950 mg by mouth every 8 (eight) hours as needed for pain.   albuterol (VENTOLIN HFA) 108 (90 Base) MCG/ACT inhaler Inhale 2 puffs into the lungs every 6 (six) hours as needed for  wheezing or shortness of breath (Asthma).   amLODipine (NORVASC) 10 MG tablet Take 1 tablet by mouth once daily   aspirin EC 81 MG tablet Take 1 tablet (81 mg total) by mouth daily. Swallow whole.   atorvastatin (LIPITOR) 80 MG tablet Take 1 tablet by mouth once daily   busPIRone (BUSPAR) 5 MG tablet Take 1 tablet by mouth twice daily   citalopram (CELEXA) 20 MG tablet Take 1 tablet (20 mg total) by mouth daily.   clopidogrel (PLAVIX) 75 MG tablet Take 1 tablet by mouth once daily   dapagliflozin propanediol (FARXIGA) 10 MG TABS tablet Take 1 tablet by mouth once daily   ezetimibe (ZETIA) 10 MG tablet Take 1 tablet by mouth once daily   fluticasone (FLONASE) 50 MCG/ACT nasal spray Place 2 sprays into both nostrils daily.   fluticasone furoate-vilanterol (BREO ELLIPTA) 200-25 MCG/ACT AEPB Inhale 1 puff into the lungs daily.   furosemide (LASIX) 20 MG tablet Take 20 mg by mouth daily.   gabapentin (NEURONTIN) 100 MG capsule Take 1 capsule by mouth twice daily   isosorbide mononitrate (IMDUR) 30 MG 24 hr tablet Take 1 tablet by mouth once daily   levothyroxine (SYNTHROID) 100 MCG tablet Take 1 tablet (100 mcg total) by mouth daily before breakfast.   losartan (COZAAR) 25 MG tablet Take 1 tablet by mouth once daily   metFORMIN  (GLUCOPHAGE-XR) 500 MG 24 hr tablet Take 1 tablet (500 mg total) by mouth daily with breakfast.   metoprolol succinate (TOPROL-XL) 25 MG 24 hr tablet Take 1 tablet by mouth once daily   nitroGLYCERIN (NITROSTAT) 0.4 MG SL tablet Place 0.4 mg under the tongue every 5 (five) minutes as needed for chest pain.   pantoprazole (PROTONIX) 40 MG tablet Take 1 tablet (40 mg total) by mouth daily.   potassium chloride (KLOR-CON) 10 MEQ tablet Take 10 mEq by mouth daily.   triamcinolone ointment (KENALOG) 0.5 % APPLY ONE APPLICATION TOPICALLY TWO TIMES DAILY TO AFFECTED AREAS ON ARMS AND LEGS (Patient taking differently: as needed.)   Physical Exam:    VS:  BP 114/72   Pulse (!) 55   Ht 5\' 7"  (1.702 m)   Wt 276 lb 9.6 oz (125.5 kg)   SpO2 96%   BMI 43.32 kg/m    Wt Readings from Last 3 Encounters:  02/26/23 276 lb 9.6 oz (125.5 kg)  02/25/23 276 lb (125.2 kg)  02/16/23 276 lb 9.6 oz (125.5 kg)    GEN: Well nourished, well developed in no acute distress NECK: No JVD; No carotid bruits CARDIAC: RRR, no murmurs, rubs, gallops RESPIRATORY:  Clear to auscultation without rales, wheezing or rhonchi  ABDOMEN: Soft, non-tender, non-distended EXTREMITIES:  No edema; No acute deformity  Asessement and Plan:.    CAD: Noted to have extensive CAD on cardiac catheterization in 11/2020, occluded RCA with collaterals, 95% distal circumflex but a small, 40% left main, 50% proximal LAD, medical management was recommended. Echo on 01/19/2023 indicated LVEF of 60 to 65%, LV with normal function, no RWMA, grade 1 diastolic dysfunction. She notes rare episodes of slight chest discomfort, quickly resolves with rest. She denies any nitroglycerin use. She will continue to monitor. Heart healthy diet and regular cardiovascular exercise encouraged. Reviewed ED precautions. Continue amlodipine 10 mg daily, aspirin 81 mg daily, Lipitor 80 mg daily, Plavix 75 mg daily, Farxiga 10 mg daily, Zetia 10 mg daily, Imdur 30 mg daily,  losartan 25 mg daily, Toprol 25 mg daily and nitroglycerin sublingual as  needed for chest pain.  Hyperlipidemia: Last lipid profile on 06/15/2022 indicated total cholesterol 165, triglycerides 79, HDL 77 and direct LDL 70.  Goal LDL less than 55. Referred to PharmD lipid clinic.  Discussed starting on Repatha today with patient, she is agreeable.  Will check fasting lipid profile and LFTs next week, will forward to Pharm.D. lipid clinic for assistance with initiating Repatha.  Primary hypertension: Blood pressure well controlled today at 108/66, on recheck 114/72. Continue current antihypertensive regimen.  Chronic diastolic HF/mild mitral valve regurgitation: Echo on 01/19/2023 indicated LVEF of 60 to 65%, LV with normal function, no RWMA, grade 1 diastolic dysfunction, mild mitral valve regurgitation, aortic valve sclerosis was present without any evidence of stenosis. She notes occasional lower extremity edema at the end of her workday, improves overnight.  She denies dyspnea on exertion, orthopnea or PND.  PVC/Bradycardia: She denies palpitations. Heart rate on EKG 49 bpm, on recheck 55 bpm, she is overall asymptomatic aside from dizziness associated with an ear infection. Discussed with patient that if her dizziness persists following ear infection to notify the office, can consider decreasing her Toprol to 12.5 mg daily. Continue Toprol 25 mg daily.  Bruit of right carotid artery: Noted to have right carotid bruit on 06/15/22, carotid duplex showed bilateral 1 to 39% stenosis.  Continue aspirin, Plavix, Zetia and atorvastatin.  Disposition: F/u with Dr. Val EagleJennette Kettle in 6 months or sooner if needed.  Signed, Rip Harbour, NP

## 2023-02-25 NOTE — Progress Notes (Signed)
    SUBJECTIVE:   CHIEF COMPLAINT / HPI:   Otalgia, right Patient reports otalgia of right ear for 1 week.  Additionally has ear fullness, and sounds are muffled.  Has sore throat, postnasal drip, and cough.  Known seasonal allergies.  No fevers, no tinnitus, no vertigo.  PERTINENT  PMH / PSH: Hypertension, asthma, allergic rhinitis  OBJECTIVE:   BP (!) 140/69   Pulse 83   Ht 5\' 7"  (1.702 m)   Wt 276 lb (125.2 kg)   SpO2 94%   BMI 43.23 kg/m    General: NAD, pleasant HEENT: Normocephalic, atraumatic head.  Circumferential, wrinkled, slightly bullous lesions on right tympanic membrane, normal canal and external ear on right.  Normal left ear. EOM intact and normal conjunctiva BL. Normal external nose. Throat not erythematous, postnasal drip present ,no exudate, no deviation. Normal dentition.  Cardio: RRR, no MRG. Cap Refill <2s. Respiratory: CTAB, normal wob on RA Skin: Warm and dry   ASSESSMENT/PLAN:   Assessment & Plan Otalgia of right ear Primary suspect a viral otalgia, exam suspicious for myringitis v. Tympanosclerosis given the acuity of which tympanic lesions developed.  She had an ear exam prior this year, which was normal.  Will treat conservatively. - Flonase 2 sprays, twice daily to assist with eustachian tube - Tylenol and ibuprofen for pain - Follow-up in 1-2 weeks - If not improved consider antibiotics versus ENT referral  Tiffany Kocher, DO [facility]

## 2023-02-25 NOTE — Patient Instructions (Signed)
It was great to see you! Thank you for allowing me to participate in your care!   I recommend that you always bring your medications to each appointment as this makes it easy to ensure we are on the correct medications and helps [place] not miss when refills are needed.  Our plans for today:  - Take tylenol and ibuprofen as needed for pain - Take 2 sprays of flonase in each nostril, twice a day for 7 days - Follow-up in 1-2 weeks for reevaluation  We are checking some labs today, I will call you if they are abnormal will send you a MyChart message or a letter if they are normal.  If you do not hear about your labs in the next 2 weeks please let us know.  Take care and seek immediate care sooner if you develop any concerns. Please remember to show up 15 minutes before your scheduled appointment time!  Tiffany Kocher, DO [facility]

## 2023-02-26 ENCOUNTER — Ambulatory Visit: Payer: BC Managed Care – PPO | Attending: Cardiology | Admitting: Cardiology

## 2023-02-26 ENCOUNTER — Encounter: Payer: Self-pay | Admitting: Cardiology

## 2023-02-26 VITALS — BP 114/72 | HR 55 | Ht 67.0 in | Wt 276.6 lb

## 2023-02-26 DIAGNOSIS — I25118 Atherosclerotic heart disease of native coronary artery with other forms of angina pectoris: Secondary | ICD-10-CM | POA: Diagnosis not present

## 2023-02-26 DIAGNOSIS — I10 Essential (primary) hypertension: Secondary | ICD-10-CM | POA: Diagnosis not present

## 2023-02-26 DIAGNOSIS — I5032 Chronic diastolic (congestive) heart failure: Secondary | ICD-10-CM

## 2023-02-26 DIAGNOSIS — R0989 Other specified symptoms and signs involving the circulatory and respiratory systems: Secondary | ICD-10-CM

## 2023-02-26 DIAGNOSIS — E782 Mixed hyperlipidemia: Secondary | ICD-10-CM

## 2023-02-26 DIAGNOSIS — R001 Bradycardia, unspecified: Secondary | ICD-10-CM

## 2023-02-26 DIAGNOSIS — I493 Ventricular premature depolarization: Secondary | ICD-10-CM

## 2023-02-26 NOTE — Patient Instructions (Addendum)
Medication Instructions:  No changes *If you need a refill on your cardiac medications before your next appointment, please call your pharmacy*  Lab Work: Next week have LFT and Lipid panel drawn.   Testing/Procedures: No testing  Follow-Up: At [facility], you and your health needs are our priority.  As part of our continuing mission to provide you with exceptional heart care, we have created designated Provider Care Teams.  These Care Teams include your primary Cardiologist (physician) and Advanced Practice Providers (APPs -  Physician Assistants and Nurse Practitioners) who all work together to provide you with the care you need, when you need it.  We recommend signing up for the patient portal called "MyChart".  Sign up information is provided on this After Visit Summary.  MyChart is used to connect with patients for Virtual Visits (Telemedicine).  Patients are able to view lab/test results, encounter notes, upcoming appointments, etc.  Non-urgent messages can be sent to your provider as well.   To learn more about what you can do with MyChart, go to [email or web address].    Your next appointment:   6 month(s)  Provider:   Reatha Harps, MD   Other instructions: If you continue to feel the dizziness call our office.

## 2023-03-01 ENCOUNTER — Other Ambulatory Visit: Payer: Self-pay

## 2023-03-01 DIAGNOSIS — G629 Polyneuropathy, unspecified: Secondary | ICD-10-CM

## 2023-03-01 MED ORDER — GABAPENTIN 100 MG PO CAPS
100.0000 mg | ORAL_CAPSULE | Freq: Two times a day (BID) | ORAL | 0 refills | Status: DC
Start: 2023-03-01 — End: 2024-01-25

## 2023-03-02 DIAGNOSIS — I5032 Chronic diastolic (congestive) heart failure: Secondary | ICD-10-CM | POA: Diagnosis not present

## 2023-03-02 DIAGNOSIS — I25118 Atherosclerotic heart disease of native coronary artery with other forms of angina pectoris: Secondary | ICD-10-CM | POA: Diagnosis not present

## 2023-03-02 DIAGNOSIS — E782 Mixed hyperlipidemia: Secondary | ICD-10-CM | POA: Diagnosis not present

## 2023-03-02 DIAGNOSIS — I10 Essential (primary) hypertension: Secondary | ICD-10-CM | POA: Diagnosis not present

## 2023-03-03 LAB — LIPID PANEL
Chol/HDL Ratio: 2 ratio (ref 0.0–4.4)
Cholesterol, Total: 166 mg/dL (ref 100–199)
HDL: 82 mg/dL (ref >39)
LDL Chol Calc (NIH): 65 mg/dL (ref 0–99)
Triglycerides: 110 mg/dL (ref 0–149)
VLDL Cholesterol Cal: 19 mg/dL (ref 5–40)

## 2023-03-03 LAB — HEPATIC FUNCTION PANEL
ALT: 15 [IU]/L (ref 0–32)
AST: 19 [IU]/L (ref 0–40)
Albumin: 4.4 g/dL (ref 3.9–4.9)
Alkaline Phosphatase: 120 [IU]/L (ref 44–121)
Bilirubin Total: 0.3 mg/dL (ref 0.0–1.2)
Bilirubin, Direct: 0.14 mg/dL (ref 0.00–0.40)
Total Protein: 7.1 g/dL (ref 6.0–8.5)

## 2023-03-05 ENCOUNTER — Other Ambulatory Visit: Payer: Self-pay

## 2023-03-05 DIAGNOSIS — E782 Mixed hyperlipidemia: Secondary | ICD-10-CM

## 2023-03-09 ENCOUNTER — Other Ambulatory Visit: Payer: Self-pay | Admitting: Student

## 2023-03-09 DIAGNOSIS — F330 Major depressive disorder, recurrent, mild: Secondary | ICD-10-CM

## 2023-03-23 ENCOUNTER — Ambulatory Visit: Payer: BC Managed Care – PPO | Admitting: Student

## 2023-03-24 ENCOUNTER — Ambulatory Visit: Payer: BC Managed Care – PPO | Admitting: Pulmonary Disease

## 2023-03-24 ENCOUNTER — Encounter: Payer: Self-pay | Admitting: Pulmonary Disease

## 2023-03-24 VITALS — BP 120/64 | HR 47 | Temp 98.4°F | Ht 66.0 in | Wt 272.6 lb

## 2023-03-24 DIAGNOSIS — J4540 Moderate persistent asthma, uncomplicated: Secondary | ICD-10-CM | POA: Diagnosis not present

## 2023-03-24 MED ORDER — TRELEGY ELLIPTA 200-62.5-25 MCG/ACT IN AEPB: 1.0000 | INHALATION_SPRAY | Freq: Every day | RESPIRATORY_TRACT | 11 refills | Status: DC

## 2023-03-24 MED ORDER — TRELEGY ELLIPTA 200-62.5-25 MCG/ACT IN AEPB: 1.0000 | INHALATION_SPRAY | Freq: Every day | RESPIRATORY_TRACT | Status: DC

## 2023-03-24 NOTE — Progress Notes (Signed)
@Patient  ID: Bethany Beard, female    DOB: [birth year], 62 y.o.   MRN: [id number]  Chief Complaint  Patient presents with   Follow-up    Some wheezing but overall, doing well.    Referring provider: Tiffany Kocher, DO  HPI:   62 y.o. woman whom we are seeing in follow up of asthma and DOE. Most recent cardiology note reviewed.  Several notes from family medicine, PCP office reviewed.  Returns to clinic for routine follow-up.  Doing well.  Breo high-dose with improvement.  Still with some wheezing off and off.  Using albuterol 2-3 times a day.  Seems a bit more than prior.  Albuterol seem to be helpful.  Discussed with wheeze and albuterol use may benefit from additional bronchodilation.  Discussed Trelegy.  No prednisone or exacerbations requiring prednisone or antibiotics or hospitalization in the last 6 months.  HPI at initial visit: Patient reports longstanding history of asthma.  Was on allergy shots twice a week for many years.  Last received over a year ago.  He feels her breathing has not been well controlled.  More short of breath.  Worse dyspnea on inclines or stairs.  Usually okay at rest.  Some cough.  Worse in the evenings.  No position where things are better or worse.  No environmental or seasonal changes she can identify to make things better or worse.  Not really using any inhalers lately.  No other alleviating or exacerbating factors.  Today, she notes left leg is swollen.  She notes that both legs stay swollen.  Usually get better overnight, worsened during the day.  Left leg is more swollen than the right which she is concerned about.  Reviewed most recent chest x-ray 08/2020 [facility] interpretation reveals clear lungs bilaterally.  PMH: Asthma, GERD, seasonal allergies Family history: No significant Restoril severity relatives Surgical history: Back surgery, carpal tunnel surgery, cholecystectomy, tonsillectomy Social history: Current smoker, approximately 40+ pack  years, lives in [place]   Questionaires / Pulmonary Flowsheets:   ACT:  Asthma Control Test ACT Total Score  09/29/2022 10:34 AM 17   MMRC:     No data to display          Epworth:      No data to display          Tests:   FENO:  No results found for: "NITRICOXIDE"  PFT:     No data to display          WALK:      No data to display          Imaging: Personally reviewed as per EMR discussion this note  Lab Results: Personally reviewed CBC    Component Value Date/Time   WBC 4.9 06/15/2022 0841   WBC 6.1 06/03/2021 0950   RBC 4.14 06/15/2022 0841   RBC 3.69 (L) 06/03/2021 0950   HGB 11.4 06/15/2022 0841   HCT 37.4 06/15/2022 0841   PLT 268 06/15/2022 0841   MCV 90 06/15/2022 0841   MCH 27.5 06/15/2022 0841   MCH 28.7 06/03/2021 0950   MCHC 30.5 (L) 06/15/2022 0841   MCHC 31.0 06/03/2021 0950   RDW 14.7 06/15/2022 0841   LYMPHSABS 1.2 06/24/2020 1059   MONOABS 0.4 01/16/2019 0450   EOSABS 0.4 06/24/2020 1059   BASOSABS 0.1 06/24/2020 1059    BMET    Component Value Date/Time   NA 141 06/15/2022 0841   K 4.8 06/15/2022 0841   CL 104 06/15/2022 0841  CO2 22 06/15/2022 0841   GLUCOSE 115 (H) 06/15/2022 0841   GLUCOSE 113 (H) 06/03/2021 0950   BUN 19 06/15/2022 0841   CREATININE 0.92 06/15/2022 0841   CALCIUM 9.2 06/15/2022 0841   GFRNONAA >60 06/03/2021 0950   GFRAA >60 01/16/2019 0450    BNP No results found for: "BNP"  ProBNP No results found for: "PROBNP"  Specialty Problems       Pulmonary Problems   Allergic rhinitis    Qualifier: Diagnosis of  By: Daphine Deutscher FNP, Zena Amos        Asthma    Qualifier: Diagnosis of  By: Juliet Rude   IMO SNOMED Dx Update Oct 2024       Allergies  Allergen Reactions   Avocado Other (See Comments)    Was told that she was allergic but has never tried them.    Codeine Itching and Swelling   Other Other (See Comments)    Environmental and cats   Strawberry  Extract Itching and Rash   Tomato Itching and Rash    Immunization History  Administered Date(s) Administered   Influenza Split 01/04/2012   Influenza Whole 04/10/2008   Influenza, Seasonal, Injecte, Preservative Fre 01/18/2023   Influenza,inj,Quad PF,6+ Mos 06/24/2020   PFIZER Comirnaty(Gray Top)Covid-19 Tri-Sucrose Vaccine 08/10/2019, 09/04/2019, 06/24/2020   Pfizer(Comirnaty)Fall Seasonal Vaccine 12 years and older 01/18/2023   Td 04/10/2008   Tdap 01/04/2012    Past Medical History:  Diagnosis Date   Anginal pain (HCC)    Arthritis    Asthma    Coronary artery disease    Depression    Dyspnea    Hyperlipidemia    Hypertension    Hypothyroidism    Migraines    Pneumonia    Thyroid disease     Tobacco History: Social History   Tobacco Use  Smoking Status Former   Current packs/day: 0.00   Average packs/day: 1 pack/day for 45.0 years (45.0 ttl pk-yrs)   Types: Cigarettes   Start date: 10/1975   Quit date: 10/2020   Years since quitting: 2.4  Smokeless Tobacco Never   Counseling given: Not Answered   Continue to not smoke  Outpatient Encounter Medications as of 03/24/2023  Medication Sig   acetaminophen (TYLENOL) 650 MG CR tablet Take 1,950 mg by mouth every 8 (eight) hours as needed for pain.   albuterol (VENTOLIN HFA) 108 (90 Base) MCG/ACT inhaler Inhale 2 puffs into the lungs every 6 (six) hours as needed for wheezing or shortness of breath (Asthma).   amLODipine (NORVASC) 10 MG tablet Take 1 tablet by mouth once daily   aspirin EC 81 MG tablet Take 1 tablet (81 mg total) by mouth daily. Swallow whole.   atorvastatin (LIPITOR) 80 MG tablet Take 1 tablet by mouth once daily   busPIRone (BUSPAR) 5 MG tablet Take 1 tablet by mouth twice daily   citalopram (CELEXA) 20 MG tablet Take 1 tablet (20 mg total) by mouth daily.   clopidogrel (PLAVIX) 75 MG tablet Take 1 tablet by mouth once daily   dapagliflozin propanediol (FARXIGA) 10 MG TABS tablet Take 1 tablet  by mouth once daily   ezetimibe (ZETIA) 10 MG tablet Take 1 tablet by mouth once daily   fluticasone (FLONASE) 50 MCG/ACT nasal spray Place 2 sprays into both nostrils daily.   fluticasone furoate-vilanterol (BREO ELLIPTA) 200-25 MCG/ACT AEPB Inhale 1 puff into the lungs daily.   Fluticasone-Umeclidin-Vilant (TRELEGY ELLIPTA) 200-62.5-25 MCG/ACT AEPB Inhale 1 puff into the lungs daily.  furosemide (LASIX) 20 MG tablet Take 20 mg by mouth daily.   gabapentin (NEURONTIN) 100 MG capsule Take 1 capsule (100 mg total) by mouth 2 (two) times daily.   isosorbide mononitrate (IMDUR) 30 MG 24 hr tablet Take 1 tablet by mouth once daily   levothyroxine (SYNTHROID) 100 MCG tablet Take 1 tablet (100 mcg total) by mouth daily before breakfast.   losartan (COZAAR) 25 MG tablet Take 1 tablet by mouth once daily   metFORMIN (GLUCOPHAGE-XR) 500 MG 24 hr tablet Take 1 tablet (500 mg total) by mouth daily with breakfast.   metoprolol succinate (TOPROL-XL) 25 MG 24 hr tablet Take 1 tablet by mouth once daily   nitroGLYCERIN (NITROSTAT) 0.4 MG SL tablet Place 0.4 mg under the tongue every 5 (five) minutes as needed for chest pain.   pantoprazole (PROTONIX) 40 MG tablet Take 1 tablet (40 mg total) by mouth daily.   potassium chloride (KLOR-CON) 10 MEQ tablet Take 10 mEq by mouth daily.   triamcinolone ointment (KENALOG) 0.5 % APPLY ONE APPLICATION TOPICALLY TWO TIMES DAILY TO AFFECTED AREAS ON ARMS AND LEGS (Patient taking differently: as needed.)   No facility-administered encounter medications on file as of 03/24/2023.     Review of Systems  Review of Systems  N/a Physical Exam  BP 120/64 (BP Location: Left Arm, Patient Position: Sitting, Cuff Size: Large)   Pulse (!) 47   Temp 98.4 F (36.9 C) (Oral)   Ht 5\' 6"  (1.676 m)   Wt 272 lb 9.6 oz (123.7 kg)   SpO2 95%   BMI 44.00 kg/m   Wt Readings from Last 5 Encounters:  03/24/23 272 lb 9.6 oz (123.7 kg)  02/26/23 276 lb 9.6 oz (125.5 kg)  02/25/23  276 lb (125.2 kg)  02/16/23 276 lb 9.6 oz (125.5 kg)  01/18/23 278 lb 3.2 oz (126.2 kg)    BMI Readings from Last 5 Encounters:  03/24/23 44.00 kg/m  02/26/23 43.32 kg/m  02/25/23 43.23 kg/m  02/16/23 43.32 kg/m  01/18/23 43.57 kg/m     Physical Exam General: In chair, no acute distress Eyes: EOMI, icterus Pulmonary: Clear, distant, normal work of breathing Cardiovascular: Regular in rhythm, no murmur Abdomen: Nondistended, bowel sounds present MSK: No synovitis, joint effusion Neuro: Normal gait, no weakness Psych: Normal mood, full affect  Assessment & Plan:   Dyspnea on exertion: Concern for poorly controlled asthma.  In addition, she is a former smoker so contribution of smoking-related disease as well.  High concern for obesity as well as deconditioning given relative lack of activity.  CAD as well. Great improvement with high-dose ICS/LABA therapy.  Escalate to triple inhaled therapy with addition of LAMA as below.  Asthma:  Stress importance of maintenance inhaler.  Continue high-dose Breo given significant improvement in symptoms.  Ideally would have less rescue inhaler use, using 2-3 times a day.  Fortunately, no hospitalizations or exacerbations in the last 2 years.  Given wheeze and frequent albuterol use, stop Breo, start Trelegy.  High dose.  New prescription today.   Return in about 6 months (around 09/22/2023) for f/u Dr. Judeth Horn.   Karren Burly, MD

## 2023-03-24 NOTE — Patient Instructions (Signed)
Nice to see you again  Stop Breo, start Trelegy 1 puff once a day.  Rinse your mouth out after every use.  Provided samples, example last 2 weeks.  I also provided writing a new prescription, sent to your pharmacy.  If too expensive or issues obtaining the medicine please send a message let me know.  Let me know if the Trelegy is no better.  Return to clinic in 6 months or sooner as needed with Dr. Judeth Horn

## 2023-04-02 ENCOUNTER — Ambulatory Visit (INDEPENDENT_AMBULATORY_CARE_PROVIDER_SITE_OTHER): Payer: BC Managed Care – PPO | Admitting: Student

## 2023-04-02 VITALS — BP 115/58 | HR 52 | Ht 66.0 in | Wt 278.5 lb

## 2023-04-02 DIAGNOSIS — Z1159 Encounter for screening for other viral diseases: Secondary | ICD-10-CM

## 2023-04-02 DIAGNOSIS — J019 Acute sinusitis, unspecified: Secondary | ICD-10-CM | POA: Diagnosis not present

## 2023-04-02 DIAGNOSIS — Z0000 Encounter for general adult medical examination without abnormal findings: Secondary | ICD-10-CM

## 2023-04-02 DIAGNOSIS — J302 Other seasonal allergic rhinitis: Secondary | ICD-10-CM

## 2023-04-02 DIAGNOSIS — L309 Dermatitis, unspecified: Secondary | ICD-10-CM

## 2023-04-02 MED ORDER — FLUTICASONE PROPIONATE 50 MCG/ACT NA SUSP: 2.0000 | Freq: Every day | NASAL | 6 refills | Status: DC

## 2023-04-02 MED ORDER — TRIAMCINOLONE ACETONIDE 0.5 % EX OINT: TOPICAL_OINTMENT | Freq: Two times a day (BID) | CUTANEOUS | 0 refills | Status: DC

## 2023-04-02 MED ORDER — TRIAMCINOLONE ACETONIDE 0.1 % EX CREA: 1.0000 | TOPICAL_CREAM | Freq: Two times a day (BID) | CUTANEOUS | 0 refills | Status: AC

## 2023-04-02 NOTE — Progress Notes (Signed)
    SUBJECTIVE:   Chief compliant/HPI: annual examination  Bethany Beard is a 62 y.o. who presents today for an annual exam.  History tabs reviewed and updated.   Review of systems form reviewed and notable for sinus pressure, congestion.   Sinus pressure Patient reports increased sinus pressure starting yesterday.  Increased congestion, and feeling of ear fullness on the right.  She has not taken any medications for this.  No fevers, chills, cough, eye pain, eye redness, NVD.  OBJECTIVE:   BP (!) 115/58   Pulse (!) 52   Ht 5\' 6"  (1.676 m)   Wt 278 lb 8 oz (126.3 kg)   SpO2 97%   BMI 44.95 kg/m    General: NAD, pleasant HEENT: Normocephalic, atraumatic head. Normal external ear, canal, TM bilaterally. EOM intact and normal conjunctiva BL. Normal external nose. Throat not erythematous, no exudate, no deviation. Normal dentition. Tenderness to palpation of frontal and maxillary sinuses, worse on right Cardio: RRR, no MRG. Cap Refill <2s. Respiratory: CTAB, normal wob on RA GI: Abdomen is soft, not tender, not distended. BS present Skin: Erythematous, 3-4 cm scaly rash on right lower extremity and 1cm lesion on scalp.  ASSESSMENT/PLAN:   Assessment & Plan Encounter for routine history and physical examination of adult See AVS for age appropriate recommendations  PHQ score 0, reviewed and discussed.  BP reviewed and at goal.  Asked about intimate partner violence and resources given as appropriate  Advance directives discussion: Not discussed.  Considered the following items based upon USPSTF recommendations: Diabetes screening: Previously completed. Screening for elevated cholesterol: Deferred, known HLD on treatment HIV testing: discussed Hepatitis C: ordered Hepatitis B: discussed Syphilis if at high risk: N/A GC/CT not at high risk and not ordered. Cervical cancer screening: prior Pap reviewed, repeat due in June 2028 Breast cancer screening: Complete Colorectal  cancer screening: Pending colonoscopy records from 5 years ago Lung cancer screening: Already completed this year. Vaccinations discussed.   Follow up in 1 year or sooner if indicated.  Eczema, unspecified type Eczema versus fungal infection.  Known dyshidrotic eczema. Patient declined KOH, prefers trial of corticosteroids. - Triamcinolone 0.1% for face, counseled on use - Triamcinolone 0.5% for leg, counseled on use - Follow-up if symptoms fail to improve Acute non-recurrent sinusitis, unspecified location Symptoms and exam consistent with sinusitis, likely viral.  Allergic rhinitis and viral URI on differential. - Refilled Flonase, counseled on use - Handout on viral care provided - Follow-up if symptoms worsen or fail to improve   Tiffany Kocher, DO [facility]

## 2023-04-02 NOTE — Patient Instructions (Signed)
It was great to see you! Thank you for allowing me to participate in your care!   I recommend that you always bring your medications to each appointment as this makes it easy to ensure we are on the correct medications and helps [place] not miss when refills are needed.  Our plans for today:  - Please get records of colonoscopy - Please use the 0.1% ointment on face twice daily for maximum of 14 day, DO NOT USE 0.5% on face. - Please use the 0.5% ointment on your arms and legs for maximum of 14 days. - Use non scented lotion on rash, and then cover with Petroleum jelly -If your sinus symptoms worsen or do not improve after 10 days, please return for care  We are checking some labs today, I will call you if they are abnormal will send you a MyChart message or a letter if they are normal.  If you do not hear about your labs in the next 2 weeks please let us know.  Take care and seek immediate care sooner if you develop any concerns. Please remember to show up 15 minutes before your scheduled appointment time!  Tiffany Kocher, DO [facility]    Sinusitis is often a viral infection, causing symptoms such as nasal congestion, runny nose, sore throat, cough, and general malaise. Here are some evidence-based recommendations for managing the common cold at home:  Over-the-Counter Medications 1. Pain Relievers: Acetaminophen (Tylenol) or nonsteroidal anti-inflammatory drugs (NSAIDs) like ibuprofen (Advil) can help reduce fever, sore throat, and body aches. 2. Decongestants: Pseudoephedrine (Sudafed) and phenylephrine can help relieve nasal congestion. These should be used with caution and not for more than three days to avoid rebound congestion. Please do not use these medications if you have high blood pressure or a heart condition. 3. Antihistamines: Like Zyrtec, combined with decongestants can modestly improve symptoms in adults. Do not take Claritin-D if you have a heart condition. 4. Cough  Suppressants: Dextromethorphan may help reduce cough in adults, but its effectiveness in children is not well-supported. 5. Zinc: Zinc lozenges or supplements taken within 24 hours of symptom onset may reduce the duration of cold symptoms.  Non-Medication Remedies 1. Hydration: Drink plenty of fluids like water, herbal teas, and broths to stay hydrated and help thin mucus. 2. Rest: Ensure adequate rest to help your body fight off the infection. 3. Humidified Air: Using a humidifier or taking steamy showers can help relieve nasal congestion and soothe irritated airways. 4. Nasal Saline Irrigation: Rinsing the nasal passages with saline solution can help clear mucus and relieve congestion. 5. Honey: For children over one year old, honey can help soothe a sore throat and reduce coughing. 6. Vapor Rub: Applying a mentholated chest rub can help relieve cough and congestion, especially in children.  Prevention Tips 1. Hand Hygiene: Regular hand washing with soap and water can help prevent the spread of cold viruses. 2. Avoid Close Contact: Stay away from individuals who are sick to reduce the risk of catching a cold.  When to See a Doctor  If symptoms persist for more than 10 days.  If you experience high fever, shortness of breath, or severe headache.  If you have underlying health conditions that may complicate a cold.  Remember, antibiotics are not effective against viruses. Always consult with a healthcare provider before starting any new medication, especially for children.\

## 2023-04-03 LAB — HEPATITIS C ANTIBODY: Hep C Virus Ab: NONREACTIVE

## 2023-04-08 ENCOUNTER — Other Ambulatory Visit: Payer: Self-pay | Admitting: Student

## 2023-04-08 ENCOUNTER — Encounter: Payer: Self-pay | Admitting: Student

## 2023-04-09 ENCOUNTER — Other Ambulatory Visit: Payer: Self-pay | Admitting: Student

## 2023-04-09 DIAGNOSIS — F330 Major depressive disorder, recurrent, mild: Secondary | ICD-10-CM

## 2023-04-16 ENCOUNTER — Other Ambulatory Visit: Payer: Self-pay | Admitting: Student

## 2023-04-16 DIAGNOSIS — E66813 Obesity, class 3: Secondary | ICD-10-CM

## 2023-04-16 MED ORDER — TIRZEPATIDE-WEIGHT MANAGEMENT 2.5 MG/0.5ML ~~LOC~~ SOLN: 2.5000 mg | SUBCUTANEOUS | 0 refills | Status: DC

## 2023-04-19 ENCOUNTER — Other Ambulatory Visit: Payer: Self-pay

## 2023-04-19 ENCOUNTER — Telehealth: Payer: Self-pay

## 2023-04-19 DIAGNOSIS — E039 Hypothyroidism, unspecified: Secondary | ICD-10-CM

## 2023-04-19 MED ORDER — LEVOTHYROXINE SODIUM 100 MCG PO TABS: 100.0000 ug | ORAL_TABLET | Freq: Every day | ORAL | 1 refills | Status: DC

## 2023-04-19 NOTE — Telephone Encounter (Signed)
Received call from pharmacy regarding patient's levothyroxine prescription. There is a manufacturer change from [employer] to [employer]   Per [place] laws, this has to be approved by prescriber.   If appropriate to change, please route back to me and I can call pharmacy.   Thanks.   Veronda Prude, RN

## 2023-04-19 NOTE — Telephone Encounter (Signed)
Called pharmacy and provided verbal okay for change.   Veronda Prude, RN

## 2023-04-19 NOTE — Telephone Encounter (Signed)
Pharmacy Patient Advocate Encounter   Received notification from CoverMyMeds that prior authorization for ZEPBOUND 2.5MG  is required/requested.   The patient is insured through [employer] .   PA required; PA submitted to above mentioned insurance via CoverMyMeds Key/confirmation #/EOC [id number]. Status is pending

## 2023-04-22 NOTE — Telephone Encounter (Signed)
 Pharmacy Patient Advocate Encounter  Received notification from [facility] that Prior Authorization for ZEPBOUND 2.5MG  has been APPROVED from 04/22/23 to 12/02/23   PA #/Case ID/Reference #: [id number]

## 2023-05-05 ENCOUNTER — Other Ambulatory Visit: Payer: Self-pay | Admitting: Student

## 2023-05-05 DIAGNOSIS — F330 Major depressive disorder, recurrent, mild: Secondary | ICD-10-CM

## 2023-05-16 ENCOUNTER — Other Ambulatory Visit: Payer: Self-pay | Admitting: Student

## 2023-06-05 ENCOUNTER — Other Ambulatory Visit: Payer: Self-pay | Admitting: Student

## 2023-06-05 DIAGNOSIS — F330 Major depressive disorder, recurrent, mild: Secondary | ICD-10-CM

## 2023-06-14 ENCOUNTER — Other Ambulatory Visit: Payer: Self-pay | Admitting: Student

## 2023-06-14 DIAGNOSIS — E66813 Obesity, class 3: Secondary | ICD-10-CM

## 2023-06-15 ENCOUNTER — Telehealth: Payer: Self-pay

## 2023-06-15 NOTE — Telephone Encounter (Signed)
 Spoke with pt. Made appt for Friday. Aquilla Solian, CMA

## 2023-06-15 NOTE — Telephone Encounter (Signed)
-----   Message from Tiffany Kocher sent at 06/15/2023  8:32 AM EST ----- Regarding: Needs appointment Please schedule appointment as soon as possible with ME to discuss weight loss and Zepbound.

## 2023-06-15 NOTE — Telephone Encounter (Signed)
 Attempted to call pt to make appt possibly Friday with PCP. LVM for pt to call the office to make appt. Aquilla Solian, CMA

## 2023-06-18 ENCOUNTER — Ambulatory Visit (INDEPENDENT_AMBULATORY_CARE_PROVIDER_SITE_OTHER): Payer: BC Managed Care – PPO | Admitting: Student

## 2023-06-18 ENCOUNTER — Encounter: Payer: Self-pay | Admitting: Student

## 2023-06-18 VITALS — BP 119/71 | HR 50 | Ht 66.0 in | Wt 263.5 lb

## 2023-06-18 DIAGNOSIS — E66813 Obesity, class 3: Secondary | ICD-10-CM

## 2023-06-18 DIAGNOSIS — Z23 Encounter for immunization: Secondary | ICD-10-CM | POA: Diagnosis not present

## 2023-06-18 DIAGNOSIS — K0500 Acute gingivitis, plaque induced: Secondary | ICD-10-CM | POA: Diagnosis not present

## 2023-06-18 DIAGNOSIS — Z6841 Body Mass Index (BMI) 40.0 and over, adult: Secondary | ICD-10-CM

## 2023-06-18 MED ORDER — ZEPBOUND 5 MG/0.5ML ~~LOC~~ SOAJ: 5.0000 mg | SUBCUTANEOUS | 1 refills | Status: DC

## 2023-06-18 MED ORDER — AMOXICILLIN 500 MG PO TABS: 500.0000 mg | ORAL_TABLET | Freq: Two times a day (BID) | ORAL | 0 refills | Status: AC

## 2023-06-18 NOTE — Assessment & Plan Note (Signed)
 Patient has lost 15 pounds with 7 pound.  Unfortunately is about no longer being covered by insurance, due to deductible issue.  Appeal is in place.  I highly recommend patient continues her Zepbound, as she has had great success and this will continue to decrease her risk of heart attack, stroke.  Additionally continue weight loss will improve her exercise tolerance.  Patient has made good progress on her diet and exercise, although I suspect her diet success is primarily from craving control from Zepbound. - Zepbound  5 mg weekly -Continue lifestyle modifications

## 2023-06-18 NOTE — Progress Notes (Signed)
    SUBJECTIVE:   CHIEF COMPLAINT / HPI:   Obesity Zepbound was approved by insurance on 04/19/2023.  Initial cost was $20 for her medication.  Then it became $100, and is now $900.  Patient reports she called her insurance and it is a deductible issue.  She reports that she is filing a appeal through her insurance.  Patient has lost 15 pounds with stepdown.  Stepdown is controlling her cravings, and has allowed her to reduce her caloric intake significantly.  She reports she is able to reduce her soda intake, and is now increasing her exercise including a at home yoga program.  This patient would continue to benefit from weight loss medication as it would significantly reduce her risk of heart attack and stroke as well as improve her blood pressure.  Gingivitis Patient has poor dentition.  Reports she is not have a dentist, but will schedule appointment with 1.  She has 1 area of tenderness and possible abscess in her gingiva.  She is requesting antibiotics.   OBJECTIVE:   BP (!) 148/71   Pulse (!) 52   Ht 5\' 6"  (1.676 m)   Wt 263 lb 8 oz (119.5 kg)   SpO2 99%   BMI 42.53 kg/m    General: NAD, well-appearing, well-nourished Mouth: Poor dentition, multiple dental caries.  Generalized enteritis, with possible abscess. Respiratory: No respiratory distress, breathing comfortably, able to speak in full sentences Skin: warm and dry, no rashes noted on exposed skin Psych: Appropriate affect and mood   ASSESSMENT/PLAN:   Assessment & Plan Class 3 severe obesity with body mass index (BMI) of 40.0 to 44.9 in adult, unspecified obesity type, unspecified whether serious comorbidity present [facility]) Patient has lost 15 pounds with 7 pound.  Unfortunately is about no longer being covered by insurance, due to deductible issue.  Appeal is in place.  I highly recommend patient continues her Zepbound, as she has had great success and this will continue to decrease her risk of heart attack, stroke.   Additionally continue weight loss will improve her exercise tolerance.  Patient has made good progress on her diet and exercise, although I suspect her diet success is primarily from craving control from Zepbound. - Zepbound  5 mg weekly -Continue lifestyle modifications Gingivitis, acute Possible abscess - Discussed need for dental visit - Amoxicillin 500 mg twice daily for 7 days   Tiffany Kocher, DO [facility]

## 2023-06-18 NOTE — Patient Instructions (Addendum)
 It was great to see you! Thank you for allowing me to participate in your care!   I recommend that you always bring your medications to each appointment as this makes it easy to ensure we are on the correct medications and helps [place] not miss when refills are needed.  Our plans for today:  - Take 5 mg of Zepbound weekly for 4 weeks - Take 500 mg of amoxicillin twice a day for 7 days - Please go to dentist. You can try [facility].   Take care and seek immediate care sooner if you develop any concerns. Please remember to show up 15 minutes before your scheduled appointment time!  Tiffany Kocher, DO [facility]

## 2023-06-27 ENCOUNTER — Other Ambulatory Visit: Payer: Self-pay | Admitting: Cardiovascular Disease

## 2023-07-04 ENCOUNTER — Other Ambulatory Visit: Payer: Self-pay | Admitting: Cardiovascular Disease

## 2023-07-07 ENCOUNTER — Other Ambulatory Visit: Payer: Self-pay | Admitting: Student

## 2023-07-07 DIAGNOSIS — F330 Major depressive disorder, recurrent, mild: Secondary | ICD-10-CM

## 2023-07-12 ENCOUNTER — Other Ambulatory Visit: Payer: Self-pay | Admitting: Cardiovascular Disease

## 2023-07-12 DIAGNOSIS — I25118 Atherosclerotic heart disease of native coronary artery with other forms of angina pectoris: Secondary | ICD-10-CM

## 2023-07-12 DIAGNOSIS — E7800 Pure hypercholesterolemia, unspecified: Secondary | ICD-10-CM

## 2023-07-12 DIAGNOSIS — I200 Unstable angina: Secondary | ICD-10-CM

## 2023-08-07 ENCOUNTER — Other Ambulatory Visit: Payer: Self-pay | Admitting: Student

## 2023-08-07 DIAGNOSIS — F330 Major depressive disorder, recurrent, mild: Secondary | ICD-10-CM

## 2023-08-16 ENCOUNTER — Other Ambulatory Visit: Payer: Self-pay | Admitting: Cardiovascular Disease

## 2023-09-05 NOTE — Progress Notes (Signed)
    SUBJECTIVE:   CHIEF COMPLAINT / HPI: heel pain and cough  Discussed the use of AI scribe software for clinical note transcription with the patient, who gave verbal consent to proceed.  History of Present Illness Bethany Beard is a 63 year old female who presents with left heel pain and a productive cough.  Left heel pain has persisted for three weeks, characterized by a dull ache and numbness, worsened by standing and walking, and present even while sitting. Previous attempts with shoe insoles and Tylenol  have not alleviated the pain. A cart rolled over her heel a year ago, but the pain had resolved until recently. No recent trauma.  She has a productive cough that began yesterday, with increased use of her albuterol  inhaler. She feels sweaty but is unsure about having a fever. Current medications include Breo (Trelegy discontinued due to ineffectiveness per pt)  She quit smoking about one and a half to two years ago, but her daughter, who she lives with, smokes. She occasionally smokes 'once in a blue moon.'  Had previously smoked since she was 16.   PERTINENT  PMH / PSH: Migraine, hypertension, asthma, GERD, neuropathy, chronic low back pain  OBJECTIVE:   BP 128/71   Pulse 65   Ht 5\' 6"  (1.676 m)   Wt 253 lb 3.2 oz (114.9 kg)   SpO2 99%   BMI 40.87 kg/m   General: NAD, awake, alert, responsive to questions Head: Normocephalic atraumatic CV: Regular rate and rhythm no murmurs rubs or gallops Respiratory: Clear to ausculation bilaterally, no wheezes rales or crackles, chest rises symmetrically,  no increased work of breathing, intermittent cough Foot: Inspection:  No obvious bony deformity b/l.  No swelling, erythema, or bruising b/l.  Normal arch b/l Palpation: Tenderness to palpation along left heel and arch, pain along Achilles insertion point ROM: Full  ROM of the ankle b/l. Normal midfoot flexibility b/l Strength: 5/5 strength ankle in all planes b/l Neurovascular:  N/V intact distally in the lower extremity b/l  ASSESSMENT/PLAN:   Assessment & Plan Productive cough Productive cough and increased albuterol  use x 1 day. No wheezing or coarse breath sounds. Possible allergy component due to pollen exposure. - Order chest x-ray to evaluate for underlying issues - Advise symptomatic treatment (flonase ) - Discussed that antibiotics (azithromycin) are not indicated based on current symptoms and lung exam findings. - Ensure use of Flonase  for potential allergy component. Pain of left heel Left heel pain for three weeks, likely Achilles tendonitis + plantar fasciitis. Pain not relieved by Tylenol . Tenderness at heel tendon insertion and below the heel. - Recommend eccentric stretching exercises for Achilles tendonitis. - Advise general stretching and ice massage for plantar fasciitis. - Suggest using Tulis heel cups in both shoes for lift and support. - Provided educational materials on plantar fasciitis management. - Consider referral to sports medicine if symptoms persist  Genora Kidd, MD [facility]

## 2023-09-06 ENCOUNTER — Other Ambulatory Visit: Payer: Self-pay | Admitting: Student

## 2023-09-06 ENCOUNTER — Ambulatory Visit: Admitting: Student

## 2023-09-06 VITALS — BP 128/71 | HR 65 | Ht 66.0 in | Wt 253.2 lb

## 2023-09-06 DIAGNOSIS — J302 Other seasonal allergic rhinitis: Secondary | ICD-10-CM | POA: Diagnosis not present

## 2023-09-06 DIAGNOSIS — M79672 Pain in left foot: Secondary | ICD-10-CM | POA: Insufficient documentation

## 2023-09-06 DIAGNOSIS — R058 Other specified cough: Secondary | ICD-10-CM | POA: Diagnosis not present

## 2023-09-06 DIAGNOSIS — J019 Acute sinusitis, unspecified: Secondary | ICD-10-CM | POA: Diagnosis not present

## 2023-09-06 DIAGNOSIS — F330 Major depressive disorder, recurrent, mild: Secondary | ICD-10-CM

## 2023-09-06 MED ORDER — FLUTICASONE PROPIONATE 50 MCG/ACT NA SUSP: 2.0000 | Freq: Every day | NASAL | 6 refills | Status: AC

## 2023-09-06 NOTE — Patient Instructions (Addendum)
 It was great to see you! Thank you for allowing me to participate in your care!   Our plans for today:  - We will get a chest x-ray [facility] - [address]) for you to make sure you do not have a pneumonia.  I recommend continuing your Breo inhaler and also doing albuterol  every 4 hours as needed, please continue Flonase  and any allergy medications as well.  Return to care if having shortness of breath or fevers or not improving - Your examination seems most like plantar fasciitis with a component of Achilles tendinopathy as well.  I am not provide some stretches for you to do to help with this.  If this does not seem to improve it then I recommend letting us  know so we can send you over to sports medicine.  Please try Tulis heel cups as well to lift this area and help with both of these problems.    Take care and seek immediate care sooner if you develop any concerns.  Genora Kidd, MD

## 2023-09-06 NOTE — Assessment & Plan Note (Signed)
 Left heel pain for three weeks, likely Achilles tendonitis + plantar fasciitis. Pain not relieved by Tylenol . Tenderness at heel tendon insertion and below the heel. - Recommend eccentric stretching exercises for Achilles tendonitis. - Advise general stretching and ice massage for plantar fasciitis. - Suggest using Tulis heel cups in both shoes for lift and support. - Provided educational materials on plantar fasciitis management. - Consider referral to sports medicine if symptoms persist

## 2023-09-08 ENCOUNTER — Telehealth: Payer: Self-pay

## 2023-09-08 NOTE — Telephone Encounter (Signed)
 Copied from CRM [id number]. Topic: Appointments - Scheduling Inquiry for Clinic >> Sep 08, 2023 10:48 AM Crist Dominion wrote: Reason for CRM: Patient needs to schedule an acute appointment as she has a new cough that started last weekend. E2C2 does show an availability for acute appointment types in the appropriate time frame. Please call patient back to schedule, patient states she will also need a note for work.   Spoke to patient. She stated that she was able to get an appointment with PCP for tomorrow.  Nothing further needed at this time.

## 2023-09-09 ENCOUNTER — Ambulatory Visit (INDEPENDENT_AMBULATORY_CARE_PROVIDER_SITE_OTHER): Admitting: Family Medicine

## 2023-09-09 VITALS — BP 129/80 | HR 88 | Temp 98.4°F | Ht 66.0 in | Wt 247.0 lb

## 2023-09-09 DIAGNOSIS — J441 Chronic obstructive pulmonary disease with (acute) exacerbation: Secondary | ICD-10-CM | POA: Diagnosis not present

## 2023-09-09 MED ORDER — PREDNISONE 20 MG PO TABS
40.0000 mg | ORAL_TABLET | Freq: Every day | ORAL | 0 refills | Status: AC
Start: 2023-09-09 — End: 2023-09-14

## 2023-09-09 MED ORDER — AMOXICILLIN-POT CLAVULANATE 875-125 MG PO TABS: 1.0000 | ORAL_TABLET | Freq: Two times a day (BID) | ORAL | 0 refills | Status: AC

## 2023-09-09 NOTE — Assessment & Plan Note (Signed)
 While no formal PFTs for this patient, her extensive history of tobacco use with new sputum production, wheezing, and shortness of breath is most likely COPD exacerbation.  Some component of asthma also likely contributing.  Continue home medications.  Will also send in 7-day course of Augmentin  and 5-day course of prednisone .  Return to care if not improving or having worsening breathing in next couple of days.  Would recommend PFTs 6 to 8 weeks after resolution of acute illness.

## 2023-09-09 NOTE — Progress Notes (Signed)
    SUBJECTIVE:   CHIEF COMPLAINT / HPI:   Sick symptoms Started Saturday or Sunday. Was seen on 5/19 and was given flonase  which has helped a little. A CXR was also ordered, but she could not get it due to insurance issues. The last couple of days she has not been able to really get around the house. Her family members also have this illness. She has been out of work now for 3 days. She knows she has a history of PNA and allergies. She has had sputum production, more cough, more SOB. She has also been vomiting without blood and has lost some weight since this time. She has been having hot flashes but no true fevers. She uses Breo Ellipta  (not Trelegy due to coughing more) and albuterol  which has helped, but her symptoms come back.  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, MDD, HLD, hypothyroidism  OBJECTIVE:   BP 129/80   Pulse 88   Temp 98.4 F (36.9 C)   Ht 5\' 6"  (1.676 m)   Wt 247 lb (112 kg)   SpO2 90%   BMI 39.87 kg/m  (Ambulatory SpO2 taken and O2 did not drop below 96%) General: Alert and oriented, in NAD Skin: Warm, dry, and intact HEENT: NCAT, EOM grossly normal, midline nasal septum, no posterior oropharyngeal erythema or cervical LAD Cardiac: RRR, no m/r/g appreciated Respiratory: Diffuse expiratory wheezes though breathing and speaking comfortably on RA with intermittent productive cough Abdominal: Soft, nontender, nondistended, normoactive bowel sounds Extremities: Moves all extremities grossly equally Neurological: No gross focal deficit Psychiatric: Appropriate mood and affect  ASSESSMENT/PLAN:   Assessment & Plan COPD with acute exacerbation (HCC) While no formal PFTs for this patient, her extensive history of tobacco use with new sputum production, wheezing, and shortness of breath is most likely COPD exacerbation.  Some component of asthma also likely contributing.  Continue home medications.  Will also send in 7-day course of Augmentin  and  5-day course of prednisone .  Return to care if not improving or having worsening breathing in next couple of days.  Would recommend PFTs 6 to 8 weeks after resolution of acute illness.   Genetta Kenning, MD [facility]

## 2023-09-09 NOTE — Patient Instructions (Signed)
 You have an asthma/COPD exacerbation. I have sent in antibiotics and steroids to take. If you are not improving or having worsening breathing in the next couple of days, please return to care or go to the ED.

## 2023-09-20 ENCOUNTER — Telehealth: Payer: Self-pay

## 2023-09-20 DIAGNOSIS — N898 Other specified noninflammatory disorders of vagina: Secondary | ICD-10-CM

## 2023-09-20 MED ORDER — FLUCONAZOLE 150 MG PO TABS: 150.0000 mg | ORAL_TABLET | Freq: Once | ORAL | 0 refills | Status: AC

## 2023-09-20 NOTE — Telephone Encounter (Signed)
 Patient calls nurse line requesting treatment for yeast infection.   She was recently seen on 5/22 with Dr. Fredrik Jensen and was prescribed Augmentin . Patient reports developing symptoms around 5/24-5/25.  She reports vaginal itching/ irritation and white clumpy discharge. She has tried OTC Monistat that is not helping.   She is requesting Diflucan  to be sent to [facility] [address].   Advised that I would forward message to provider to determine if rx could be sent in or if patient would need appointment.   [name], RN

## 2023-09-28 ENCOUNTER — Ambulatory Visit (INDEPENDENT_AMBULATORY_CARE_PROVIDER_SITE_OTHER): Admitting: Radiology

## 2023-09-28 ENCOUNTER — Encounter: Payer: Self-pay | Admitting: Radiology

## 2023-09-28 VITALS — BP 112/68 | HR 85 | Wt 246.2 lb

## 2023-09-28 DIAGNOSIS — N760 Acute vaginitis: Secondary | ICD-10-CM | POA: Diagnosis not present

## 2023-09-28 LAB — WET PREP FOR TRICH, YEAST, CLUE

## 2023-09-28 MED ORDER — NYSTATIN-TRIAMCINOLONE 100000-0.1 UNIT/GM-% EX OINT: 1.0000 | TOPICAL_OINTMENT | Freq: Two times a day (BID) | CUTANEOUS | 0 refills | Status: AC

## 2023-09-28 MED ORDER — FLUCONAZOLE 150 MG PO TABS: 150.0000 mg | ORAL_TABLET | ORAL | 0 refills | Status: AC

## 2023-09-28 NOTE — Progress Notes (Signed)
      Subjective: Bethany Beard is a 63 y.o. female treated for Bronchitis with antibiotics and steroids 2 weeks ago. External vaginal itching began after competing medications and was given #1 Diflucan . Severe external vaginal itching returned 5 days ago.    Review of Systems  All other systems reviewed and are negative.   Past Medical History:  Diagnosis Date   Anginal pain (HCC)    Arthritis    Asthma    Coronary artery disease    Depression    Dyspnea    Hyperlipidemia    Hypertension    Hypothyroidism    Migraines    Pneumonia    Thyroid  disease       Objective:  Today's Vitals   09/28/23 0809  BP: 112/68  Pulse: 85  SpO2: 95%  Weight: 246 lb 3.2 oz (111.7 kg)   Body mass index is 39.74 kg/m.   Physical Exam Vitals and nursing note reviewed. Exam conducted with a chaperone present.  Constitutional:      Appearance: Normal appearance. She is well-developed.  Pulmonary:     Effort: Pulmonary effort is normal.  Abdominal:     General: Abdomen is flat.     Palpations: Abdomen is soft.  Genitourinary:    General: Normal vulva.     Labia:        Right: Rash and tenderness present.        Left: Rash and tenderness present.      Vagina: Vaginal discharge present. No erythema, bleeding or lesions.     Cervix: Normal. No discharge, friability, lesion or erythema.     Uterus: Normal.        Comments: erythema Neurological:     Mental Status: She is alert.  Psychiatric:        Mood and Affect: Mood normal.        Thought Content: Thought content normal.        Judgment: Judgment normal.    Microscopic wet-mount exam shows negative for pathogens, normal epithelial cells.   Ellis Guys, CMA present for exam  Assessment:/Plan:   1. Vulvovaginitis (Primary) - fluconazole  (DIFLUCAN ) 150 MG tablet; Take 1 tablet (150 mg total) by mouth every 3 (three) days.  Dispense: 3 tablet; Refill: 0 - nystatin -triamcinolone  ointment (MYCOLOG); Apply 1 Application  topically 2 (two) times daily.  Dispense: 30 g; Refill: 0 - WET PREP FOR TRICH, YEAST, CLUE    Avoid intercourse until symptoms are resolved. Safe sex encouraged. Avoid the use of soaps or perfumed products in the peri area. Avoid tub baths and sitting in sweaty or wet clothing for prolonged periods of time.    Maedell Hedger B, NP 8:23 AM

## 2023-10-06 ENCOUNTER — Other Ambulatory Visit: Payer: Self-pay | Admitting: Cardiovascular Disease

## 2023-10-07 ENCOUNTER — Encounter: Payer: Self-pay | Admitting: Student

## 2023-10-07 ENCOUNTER — Other Ambulatory Visit: Payer: Self-pay

## 2023-10-07 MED ORDER — DAPAGLIFLOZIN PROPANEDIOL 10 MG PO TABS: 10.0000 mg | ORAL_TABLET | Freq: Every day | ORAL | 2 refills | Status: AC

## 2023-12-02 ENCOUNTER — Other Ambulatory Visit: Payer: Self-pay | Admitting: Family Medicine

## 2023-12-02 DIAGNOSIS — F330 Major depressive disorder, recurrent, mild: Secondary | ICD-10-CM

## 2024-01-05 ENCOUNTER — Other Ambulatory Visit: Payer: Self-pay | Admitting: Student

## 2024-01-25 ENCOUNTER — Ambulatory Visit (INDEPENDENT_AMBULATORY_CARE_PROVIDER_SITE_OTHER): Admitting: Pulmonary Disease

## 2024-01-25 ENCOUNTER — Encounter: Payer: Self-pay | Admitting: Pulmonary Disease

## 2024-01-25 VITALS — BP 130/75 | HR 56 | Temp 98.7°F | Ht 66.0 in | Wt 231.6 lb

## 2024-01-25 DIAGNOSIS — J45909 Unspecified asthma, uncomplicated: Secondary | ICD-10-CM

## 2024-01-25 DIAGNOSIS — J4540 Moderate persistent asthma, uncomplicated: Secondary | ICD-10-CM

## 2024-01-25 DIAGNOSIS — R0609 Other forms of dyspnea: Secondary | ICD-10-CM | POA: Diagnosis not present

## 2024-01-25 MED ORDER — ALBUTEROL SULFATE HFA 108 (90 BASE) MCG/ACT IN AERS: 2.0000 | INHALATION_SPRAY | Freq: Four times a day (QID) | RESPIRATORY_TRACT | 12 refills | Status: AC | PRN

## 2024-01-25 NOTE — Patient Instructions (Addendum)
 No changes to medication.  It looks like you have enough Breo for now, please let me know if I need to refill this in the future  I sent a refill for albuterol , 2 inhalers at that time.  Use as needed.  If the cough and symptoms are not improving let me know and I can send in some prednisone  in the coming days or weeks or anytime in the future.  Return to clinic in 6 months or sooner as needed with Dr. Annella

## 2024-01-25 NOTE — Progress Notes (Signed)
 @Patient  ID: Bethany Beard, female    DOB: 02/08/1961, 63 y.o.   MRN: [id number]  No chief complaint on file.   Referring provider: Howell Lunger, DO  HPI:   63 y.o. woman whom we are seeing in follow up of asthma and DOE. Most recent cardiology note reviewed.  Several notes from family medicine, PCP office reviewed.  Returns to clinic for routine follow-up, overdue.  At last visit, high-dose Breo was escalated to high-dose Trelegy.  Unfortunately made the cough worse.  She is back to [place].  Overall symptoms fairly well-controlled.  Stable.  She is without rescue inhaler for couple months.  Her cough is a bit worse.  She notes around the late summer or early fall with ragweed things worsened in general.  She is clear on exam.  No wheezing.  We discussed resuming albuterol  see if this helps, if not we should consider a course of prednisone .  HPI at initial visit: Patient reports longstanding history of asthma.  Was on allergy shots twice a week for many years.  Last received over a year ago.  He feels her breathing has not been well controlled.  More short of breath.  Worse dyspnea on inclines or stairs.  Usually okay at rest.  Some cough.  Worse in the evenings.  No position where things are better or worse.  No environmental or seasonal changes she can identify to make things better or worse.  Not really using any inhalers lately.  No other alleviating or exacerbating factors.  Today, she notes left leg is swollen.  She notes that both legs stay swollen.  Usually get better overnight, worsened during the day.  Left leg is more swollen than the right which she is concerned about.  Reviewed most recent chest x-ray 08/2020 [facility] interpretation reveals clear lungs bilaterally.  PMH: Asthma, GERD, seasonal allergies Family history: No significant Restoril severity relatives Surgical history: Back surgery, carpal tunnel surgery, cholecystectomy, tonsillectomy Social history: Current  smoker, approximately 40+ pack years, lives in [place]   Questionaires / Pulmonary Flowsheets:   ACT:  Asthma Control Test ACT Total Score  09/29/2022 10:34 AM 17   MMRC:     No data to display          Epworth:      No data to display          Tests:   FENO:  No results found for: NITRICOXIDE  PFT:     No data to display          WALK:      No data to display          Imaging: Personally reviewed as per EMR discussion this note  Lab Results: Personally reviewed CBC    Component Value Date/Time   WBC 4.9 06/15/2022 0841   WBC 6.1 06/03/2021 0950   RBC 4.14 06/15/2022 0841   RBC 3.69 (L) 06/03/2021 0950   HGB 11.4 06/15/2022 0841   HCT 37.4 06/15/2022 0841   PLT 268 06/15/2022 0841   MCV 90 06/15/2022 0841   MCH 27.5 06/15/2022 0841   MCH 28.7 06/03/2021 0950   MCHC 30.5 (L) 06/15/2022 0841   MCHC 31.0 06/03/2021 0950   RDW 14.7 06/15/2022 0841   LYMPHSABS 1.2 06/24/2020 1059   MONOABS 0.4 01/16/2019 0450   EOSABS 0.4 06/24/2020 1059   BASOSABS 0.1 06/24/2020 1059    BMET    Component Value Date/Time   NA 141 06/15/2022 0841   K  4.8 06/15/2022 0841   CL 104 06/15/2022 0841   CO2 22 06/15/2022 0841   GLUCOSE 115 (H) 06/15/2022 0841   GLUCOSE 113 (H) 06/03/2021 0950   BUN 19 06/15/2022 0841   CREATININE 0.92 06/15/2022 0841   CALCIUM  9.2 06/15/2022 0841   GFRNONAA >60 06/03/2021 0950   GFRAA >60 01/16/2019 0450    BNP No results found for: BNP  ProBNP No results found for: PROBNP  Specialty Problems       Pulmonary Problems   Allergic rhinitis   Qualifier: Diagnosis of  By: Gladis FNP, Nykedtra        Asthma   Qualifier: Diagnosis of  By: Gladis SHIRLENE Daughters   IMO SNOMED Dx Update Oct 2024      COPD with acute exacerbation (HCC)    Allergies  Allergen Reactions   Avocado Other (See Comments)    Was told that she was allergic but has never tried them.    Codeine Itching and Swelling   Other  Other (See Comments)    Environmental and cats   Strawberry Extract Itching and Rash   Tomato Itching and Rash    Immunization History  Administered Date(s) Administered   Influenza Split 01/04/2012   Influenza Whole 04/10/2008   Influenza, Seasonal, Injecte, Preservative Fre 01/18/2023   Influenza,inj,Quad PF,6+ Mos 06/24/2020   PFIZER Comirnaty (Gray Top)Covid-19 Tri-Sucrose Vaccine 08/10/2019, 09/04/2019, 06/24/2020   PNEUMOCOCCAL CONJUGATE-20 06/18/2023   Pfizer(Comirnaty )Fall Seasonal Vaccine 12 years and older 01/18/2023   Td 04/10/2008   Tdap 01/04/2012    Past Medical History:  Diagnosis Date   Anginal pain    Arthritis    Asthma    Coronary artery disease    Depression    Dyspnea    Hyperlipidemia    Hypertension    Hypothyroidism    Migraines    Pneumonia    Thyroid  disease     Tobacco History: Social History   Tobacco Use  Smoking Status Former   Current packs/day: 0.00   Average packs/day: 1 pack/day for 45.0 years (45.0 ttl pk-yrs)   Types: Cigarettes   Start date: 10/1975   Quit date: 10/2020   Years since quitting: 3.2  Smokeless Tobacco Never   Counseling given: Not Answered   Continue to not smoke  Outpatient Encounter Medications as of 01/25/2024  Medication Sig   acetaminophen  (TYLENOL ) 650 MG CR tablet Take 1,950 mg by mouth every 8 (eight) hours as needed for pain.   amLODipine  (NORVASC ) 10 MG tablet Take 1 tablet by mouth once daily   aspirin  EC 81 MG tablet Take 1 tablet (81 mg total) by mouth daily. Swallow whole.   atorvastatin  (LIPITOR) 80 MG tablet Take 1 tablet by mouth once daily   BREO ELLIPTA  200-25 MCG/ACT AEPB INHALE 1 PUFF INTO THE LUNGS DAILY   busPIRone  (BUSPAR ) 5 MG tablet Take 1 tablet by mouth twice daily   citalopram  (CELEXA ) 20 MG tablet Take 1 tablet by mouth once daily   clopidogrel  (PLAVIX ) 75 MG tablet Take 1 tablet by mouth once daily   dapagliflozin  propanediol (FARXIGA ) 10 MG TABS tablet Take 1 tablet (10 mg  total) by mouth daily.   ezetimibe  (ZETIA ) 10 MG tablet Take 1 tablet by mouth once daily   fluconazole  (DIFLUCAN ) 150 MG tablet Take 1 tablet (150 mg total) by mouth every 3 (three) days.   fluticasone  (FLONASE ) 50 MCG/ACT nasal spray Place 2 sprays into both nostrils daily.   furosemide  (LASIX ) 20 MG tablet Take 20  mg by mouth daily.   isosorbide  mononitrate (IMDUR ) 30 MG 24 hr tablet Take 1 tablet by mouth once daily   levothyroxine  (SYNTHROID ) 100 MCG tablet Take 1 tablet (100 mcg total) by mouth daily before breakfast.   losartan  (COZAAR ) 25 MG tablet Take 1 tablet by mouth once daily   metFORMIN  (GLUCOPHAGE -XR) 500 MG 24 hr tablet Take 1 tablet (500 mg total) by mouth daily with breakfast.   metoprolol  succinate (TOPROL -XL) 25 MG 24 hr tablet Take 1 tablet by mouth once daily   nitroGLYCERIN  (NITROSTAT ) 0.4 MG SL tablet Place 0.4 mg under the tongue every 5 (five) minutes as needed for chest pain.   nystatin -triamcinolone  ointment (MYCOLOG) Apply 1 Application topically 2 (two) times daily.   potassium chloride  (KLOR-CON ) 10 MEQ tablet Take 10 mEq by mouth daily.   [DISCONTINUED] albuterol  (VENTOLIN  HFA) 108 (90 Base) MCG/ACT inhaler Inhale 2 puffs into the lungs every 6 (six) hours as needed for wheezing or shortness of breath (Asthma).   albuterol  (VENTOLIN  HFA) 108 (90 Base) MCG/ACT inhaler Inhale 2 puffs into the lungs every 6 (six) hours as needed for wheezing or shortness of breath (Asthma).   pantoprazole  (PROTONIX ) 40 MG tablet Take 1 tablet (40 mg total) by mouth daily. (Patient not taking: Reported on 01/25/2024)   triamcinolone  cream (KENALOG ) 0.1 % Apply 1 Application topically 2 (two) times daily. To face, do not exceed 14 days. (Patient not taking: Reported on 01/25/2024)   [DISCONTINUED] Fluticasone -Umeclidin-Vilant (TRELEGY ELLIPTA ) 200-62.5-25 MCG/ACT AEPB Inhale 1 puff into the lungs daily. (Patient not taking: Reported on 09/28/2023)   [DISCONTINUED] gabapentin  (NEURONTIN ) 100  MG capsule Take 1 capsule (100 mg total) by mouth 2 (two) times daily. (Patient not taking: Reported on 09/28/2023)   [DISCONTINUED] tirzepatide  (ZEPBOUND ) 5 MG/0.5ML Pen Inject 5 mg into the skin once a week.   [DISCONTINUED] triamcinolone  ointment (KENALOG ) 0.5 % Apply topically 2 (two) times daily. Apply to leg and arms twice daily. Do not exceed 14 days of use. (Patient not taking: Reported on 01/25/2024)   No facility-administered encounter medications on file as of 01/25/2024.     Review of Systems  Review of Systems  N/a Physical Exam  BP 130/75   Pulse (!) 56   Temp 98.7 F (37.1 C) (Oral)   Ht 5' 6 (1.676 m)   Wt 231 lb 9.6 oz (105.1 kg)   SpO2 93%   BMI 37.38 kg/m   Wt Readings from Last 5 Encounters:  01/25/24 231 lb 9.6 oz (105.1 kg)  09/28/23 246 lb 3.2 oz (111.7 kg)  09/09/23 247 lb (112 kg)  09/06/23 253 lb 3.2 oz (114.9 kg)  06/18/23 263 lb 8 oz (119.5 kg)    BMI Readings from Last 5 Encounters:  01/25/24 37.38 kg/m  09/28/23 39.74 kg/m  09/09/23 39.87 kg/m  09/06/23 40.87 kg/m  06/18/23 42.53 kg/m     Physical Exam General: In chair, no acute distress Eyes: EOMI, icterus Pulmonary: Clear, distant, normal work of breathing Cardiovascular: Regular in rhythm, no murmur Abdomen: Nondistended, bowel sounds present MSK: No synovitis, joint effusion Neuro: Normal gait, no weakness Psych: Normal mood, full affect  Assessment & Plan:   Dyspnea on exertion: Concern for poorly controlled asthma.  In addition, she is a former smoker so contribution of smoking-related disease as well.  High concern for obesity as well as deconditioning given relative lack of activity.  CAD as well.  Historically improved with ICS LABA therapy concerning for underlying asthma as significant contributor.  Asthma:  Stress importance of maintenance inhaler.  Significant improvement with high-dose Breo but with ongoing frequent albuterol  use was escalated to Trelegy 03/2023.   Unfortunate, this caused worsening cough.  Now back on Breo.  Relatively well-controlled symptoms.  Worsening cough, likely ragweed or seasonal allergy triggered.  Resume albuterol  with which she has been without.  If not improving consider course of prednisone  in the coming weeks.   Return in about 6 months (around 07/25/2024) for f/u Dr. Annella.   Bethany JONELLE Annella, MD

## 2024-02-03 ENCOUNTER — Other Ambulatory Visit: Payer: Self-pay | Admitting: Student

## 2024-02-03 DIAGNOSIS — E039 Hypothyroidism, unspecified: Secondary | ICD-10-CM

## 2024-02-08 DIAGNOSIS — Z1231 Encounter for screening mammogram for malignant neoplasm of breast: Secondary | ICD-10-CM | POA: Diagnosis not present

## 2024-02-08 LAB — HM MAMMOGRAPHY

## 2024-02-11 ENCOUNTER — Encounter: Payer: Self-pay | Admitting: Student

## 2024-03-04 ENCOUNTER — Other Ambulatory Visit: Payer: Self-pay | Admitting: Family Medicine

## 2024-03-04 DIAGNOSIS — E039 Hypothyroidism, unspecified: Secondary | ICD-10-CM

## 2024-03-19 ENCOUNTER — Other Ambulatory Visit: Payer: Self-pay | Admitting: Student

## 2024-03-19 DIAGNOSIS — F330 Major depressive disorder, recurrent, mild: Secondary | ICD-10-CM

## 2024-03-26 ENCOUNTER — Other Ambulatory Visit: Payer: Self-pay | Admitting: Cardiovascular Disease

## 2024-04-03 ENCOUNTER — Other Ambulatory Visit: Payer: Self-pay | Admitting: Student

## 2024-04-03 ENCOUNTER — Other Ambulatory Visit: Payer: Self-pay | Admitting: Cardiovascular Disease

## 2024-04-03 DIAGNOSIS — E66813 Obesity, class 3: Secondary | ICD-10-CM

## 2024-04-03 DIAGNOSIS — I25118 Atherosclerotic heart disease of native coronary artery with other forms of angina pectoris: Secondary | ICD-10-CM

## 2024-04-03 DIAGNOSIS — E7800 Pure hypercholesterolemia, unspecified: Secondary | ICD-10-CM

## 2024-04-03 DIAGNOSIS — I200 Unstable angina: Secondary | ICD-10-CM

## 2024-04-03 DIAGNOSIS — R7303 Prediabetes: Secondary | ICD-10-CM

## 2024-04-08 ENCOUNTER — Other Ambulatory Visit: Payer: Self-pay | Admitting: Student

## 2024-04-08 DIAGNOSIS — E039 Hypothyroidism, unspecified: Secondary | ICD-10-CM

## 2024-04-09 ENCOUNTER — Other Ambulatory Visit: Payer: Self-pay | Admitting: Student

## 2024-04-28 ENCOUNTER — Other Ambulatory Visit: Payer: Self-pay | Admitting: Cardiovascular Disease

## 2024-05-18 ENCOUNTER — Encounter (HOSPITAL_BASED_OUTPATIENT_CLINIC_OR_DEPARTMENT_OTHER): Payer: Self-pay

## 2024-05-18 ENCOUNTER — Emergency Department (HOSPITAL_BASED_OUTPATIENT_CLINIC_OR_DEPARTMENT_OTHER)

## 2024-05-18 ENCOUNTER — Other Ambulatory Visit: Payer: Self-pay

## 2024-05-18 ENCOUNTER — Emergency Department (HOSPITAL_BASED_OUTPATIENT_CLINIC_OR_DEPARTMENT_OTHER): Admitting: Radiology

## 2024-05-18 ENCOUNTER — Emergency Department (HOSPITAL_BASED_OUTPATIENT_CLINIC_OR_DEPARTMENT_OTHER)
Admission: EM | Admit: 2024-05-18 | Discharge: 2024-05-18 | Disposition: A | Discharge status: Home / Self Care | Attending: Emergency Medicine | Admitting: Emergency Medicine

## 2024-05-18 DIAGNOSIS — W19XXXA Unspecified fall, initial encounter: Secondary | ICD-10-CM | POA: Diagnosis not present

## 2024-05-18 DIAGNOSIS — M79631 Pain in right forearm: Secondary | ICD-10-CM | POA: Diagnosis not present

## 2024-05-18 DIAGNOSIS — Z79899 Other long term (current) drug therapy: Secondary | ICD-10-CM | POA: Insufficient documentation

## 2024-05-18 DIAGNOSIS — Z7984 Long term (current) use of oral hypoglycemic drugs: Secondary | ICD-10-CM | POA: Diagnosis not present

## 2024-05-18 DIAGNOSIS — R42 Dizziness and giddiness: Secondary | ICD-10-CM | POA: Insufficient documentation

## 2024-05-18 DIAGNOSIS — M79671 Pain in right foot: Secondary | ICD-10-CM | POA: Diagnosis not present

## 2024-05-18 DIAGNOSIS — Z7982 Long term (current) use of aspirin: Secondary | ICD-10-CM | POA: Insufficient documentation

## 2024-05-18 DIAGNOSIS — J449 Chronic obstructive pulmonary disease, unspecified: Secondary | ICD-10-CM | POA: Insufficient documentation

## 2024-05-18 DIAGNOSIS — Z7902 Long term (current) use of antithrombotics/antiplatelets: Secondary | ICD-10-CM | POA: Insufficient documentation

## 2024-05-18 DIAGNOSIS — H81391 Other peripheral vertigo, right ear: Secondary | ICD-10-CM

## 2024-05-18 DIAGNOSIS — I10 Essential (primary) hypertension: Secondary | ICD-10-CM | POA: Insufficient documentation

## 2024-05-18 DIAGNOSIS — Z87891 Personal history of nicotine dependence: Secondary | ICD-10-CM | POA: Diagnosis not present

## 2024-05-18 DIAGNOSIS — E039 Hypothyroidism, unspecified: Secondary | ICD-10-CM | POA: Insufficient documentation

## 2024-05-18 DIAGNOSIS — R112 Nausea with vomiting, unspecified: Secondary | ICD-10-CM | POA: Insufficient documentation

## 2024-05-18 DIAGNOSIS — R2689 Other abnormalities of gait and mobility: Secondary | ICD-10-CM | POA: Insufficient documentation

## 2024-05-18 LAB — COMPREHENSIVE METABOLIC PANEL WITH GFR
ALT: 11 U/L (ref 0–44)
AST: 18 U/L (ref 15–41)
Albumin: 4.1 g/dL (ref 3.5–5.0)
Alkaline Phosphatase: 86 U/L (ref 38–126)
Anion gap: 11 (ref 5–15)
BUN: 17 mg/dL (ref 8–23)
CO2: 26 mmol/L (ref 22–32)
Calcium: 10 mg/dL (ref 8.9–10.3)
Chloride: 103 mmol/L (ref 98–111)
Creatinine, Ser: 0.86 mg/dL (ref 0.44–1.00)
GFR, Estimated: >60 mL/min
Glucose, Bld: 91 mg/dL (ref 70–99)
Potassium: 4.2 mmol/L (ref 3.5–5.1)
Sodium: 140 mmol/L (ref 135–145)
Total Bilirubin: 0.3 mg/dL (ref 0.0–1.2)
Total Protein: 6.9 g/dL (ref 6.5–8.1)

## 2024-05-18 LAB — CBC WITH DIFFERENTIAL/PLATELET
Abs Immature Granulocytes: 0.02 10*3/uL (ref 0.00–0.07)
Basophils Absolute: 0 10*3/uL (ref 0.0–0.1)
Basophils Relative: 0 %
Eosinophils Absolute: 0.3 10*3/uL (ref 0.0–0.5)
Eosinophils Relative: 5 %
HCT: 41.1 % (ref 36.0–46.0)
Hemoglobin: 13.1 g/dL (ref 12.0–15.0)
Immature Granulocytes: 0 %
Lymphocytes Relative: 26 %
Lymphs Abs: 1.5 10*3/uL (ref 0.7–4.0)
MCH: 31.5 pg (ref 26.0–34.0)
MCHC: 31.9 g/dL (ref 30.0–36.0)
MCV: 98.8 fL (ref 80.0–100.0)
Monocytes Absolute: 0.4 10*3/uL (ref 0.1–1.0)
Monocytes Relative: 7 %
Neutro Abs: 3.8 10*3/uL (ref 1.7–7.7)
Neutrophils Relative %: 62 %
Platelets: 246 10*3/uL (ref 150–400)
RBC: 4.16 MIL/uL (ref 3.87–5.11)
RDW: 15.6 % — ABNORMAL HIGH (ref 11.5–15.5)
WBC: 6 10*3/uL (ref 4.0–10.5)
nRBC: 0 % (ref 0.0–0.2)

## 2024-05-18 MED ORDER — MECLIZINE HCL 25 MG PO TABS: 25.0000 mg | ORAL_TABLET | Freq: Three times a day (TID) | ORAL | 0 refills | Status: AC | PRN

## 2024-05-18 MED ORDER — MECLIZINE HCL 25 MG PO TABS
25.0000 mg | ORAL_TABLET | Freq: Once | ORAL | Status: AC
  Administered 2024-05-18: 25 mg via ORAL
  Filled 2024-05-18: qty 1

## 2024-05-18 NOTE — Discharge Instructions (Addendum)
 Follow up with PCP - may benefit from vestibular rehab referral Contact a health care provider if: You have a fever. Your condition gets worse or you develop new symptoms. Your family or friends notice any behavioral changes. You have nausea or vomiting that gets worse. You have numbness or a prickling and tingling sensation. Get help right away if you: Have difficulty speaking or moving. Are always dizzy or faint. Develop severe headaches. Have weakness in your legs or arms. Have changes in your hearing or vision. Develop a stiff neck. Develop sensitivity to light. These symptoms may represent a serious problem that is an emergency. Do not wait to see if the symptoms will go away. Get medical help right away. Call your local emergency services (911 in the [place] Do not drive yourself to the hospital.

## 2024-05-18 NOTE — ED Triage Notes (Addendum)
 Presents to ED for worsening dizziness that's been ongoing for about 12 months. Has had recent fall d/t dizziness and has bruising to R side of back. Pain to R foot and R forearm. Takes plavix 

## 2024-05-18 NOTE — ED Notes (Signed)
 CCMD called for cardiac monitoring.

## 2024-05-18 NOTE — ED Provider Notes (Signed)
 " [place] [facility]GENC[facility] DEPARTMENT AT [facility] Provider Note   CSN: [id number] Arrival date & time: 05/18/24  1154     Patient presents with: Dizziness and Fall   Bethany Beard is a 64 y.o. female who presents emergency department with a chief complaint of dizziness.  Patient has a past medical history of heavy smoking and quit in 2022 but has COPD, history of hypothyroidism, allergies, eczema, hypertension, hyperlipidemia,, angina.  She is on Plavix .  Patient reports that she has been having dizziness for 6 months.  Patient reports that she saw her doctor just prior to the onset of this dizziness and was told that there was something in my ear.  She feels like her right ear is stopped about the time.  She reports that shortly thereafter she began having dizziness which is worse with change in position.  She has associated nausea and gait instability.  Patient reports that it went away briefly for about a month but then came back and she said persistent daily waxing and waning room spinning type dizziness it is worse with movement of her head and changing position.  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  Patient reports that she was at work yesterday and she vomited and fell and her doctor told her that she needed to come to the emergency department.  She also reports falling 2 days ago in the middle of the night when she was going to the bathroom, injuring her right hand and right foot and slamming her back against a trash can.  She is on Plavix .    Dizziness Fall       Prior to Admission medications  Medication Sig Start Date End Date Taking? Authorizing Provider  acetaminophen  (TYLENOL ) 650 MG CR tablet Take 1,950 mg by mouth every 8 (eight) hours as needed for pain.    [provider]  albuterol  (VENTOLIN  HFA) 108 (90 Base) MCG/ACT inhaler Inhale 2 puffs into the lungs every 6 (six) hours as needed for wheezing or  shortness of breath (Asthma). 01/25/24   Hunsucker, Donnice SAUNDERS, MD  amLODipine  (NORVASC ) 10 MG tablet Take 1 tablet by mouth once daily 09/18/22   O'Neal, Darryle Ned, MD  aspirin  EC 81 MG tablet Take 1 tablet (81 mg total) by mouth daily. Swallow whole. 12/24/20   O'NealDarryle Ned, MD  atorvastatin  (LIPITOR) 80 MG tablet Take 1 tablet by mouth once daily 04/05/24   O'Neal, Darryle Ned, MD  BREO ELLIPTA  200-25 MCG/ACT AEPB Inhale 1 puff by mouth once daily 04/10/24   Bronson, Martin, DO  busPIRone  (BUSPAR ) 5 MG tablet Take 1 tablet by mouth twice daily 03/20/24   Howell Lunger, DO  citalopram  (CELEXA ) 20 MG tablet Take 1 tablet by mouth once daily 03/20/24   Howell Lunger, DO  clopidogrel  (PLAVIX ) 75 MG tablet Take 1 tablet (75 mg total) by mouth daily. 04/28/24   O'NealDarryle Ned, MD  dapagliflozin  propanediol (FARXIGA ) 10 MG TABS tablet Take 1 tablet (10 mg total) by mouth daily. 10/07/23   Howell Lunger, DO  ezetimibe  (ZETIA ) 10 MG tablet Take 1 tablet by mouth once daily 04/05/24   O'Neal, Darryle Ned, MD  fluconazole  (DIFLUCAN ) 150 MG tablet Take 1 tablet (150 mg total) by mouth every 3 (three) days. 09/28/23   Chrzanowski, Jami B, NP  fluticasone  (FLONASE ) 50 MCG/ACT nasal spray Place 2 sprays into both nostrils daily. 09/06/23   Christia Budds, MD  furosemide  (LASIX ) 20 MG tablet Take 20 mg by mouth daily.    [provider]  isosorbide  mononitrate (IMDUR ) 30 MG 24 hr tablet Take 1 tablet by mouth once daily 02/09/23   O'Neal, Darryle Ned, MD  levothyroxine  (SYNTHROID ) 100 MCG tablet TAKE 1 TABLET BY MOUTH ONCE DAILY BEFORE BREAKFAST 04/10/24   Howell Lunger, DO  losartan  (COZAAR ) 25 MG tablet Take 1 tablet by mouth once daily 08/19/23   O'Neal, Darryle Ned, MD  metFORMIN  (GLUCOPHAGE -XR) 500 MG 24 hr tablet Take 1 tablet by mouth once daily with breakfast 04/03/24   Howell Lunger, DO  metoprolol  succinate (TOPROL -XL) 25 MG 24 hr tablet Take 1 tablet by mouth once  daily 04/05/24   O'Neal, Darryle Ned, MD  nitroGLYCERIN  (NITROSTAT ) 0.4 MG SL tablet Place 0.4 mg under the tongue every 5 (five) minutes as needed for chest pain.    [provider]  nystatin -triamcinolone  ointment (MYCOLOG) Apply 1 Application topically 2 (two) times daily. 09/28/23   Chrzanowski, Jami B, NP  pantoprazole  (PROTONIX ) 40 MG tablet Take 1 tablet (40 mg total) by mouth daily. Patient not taking: Reported on 01/25/2024 12/13/20   Henry Shaver B, NP  potassium chloride  (KLOR-CON ) 10 MEQ tablet Take 10 mEq by mouth daily. 01/21/21   [provider]  triamcinolone  cream (KENALOG ) 0.1 % Apply 1 Application topically 2 (two) times daily. To face, do not exceed 14 days. Patient not taking: Reported on 01/25/2024 04/02/23   Howell Lunger, DO    Allergies: Avocado, Codeine, Other, Strawberry extract, and Tomato    Review of Systems  Neurological:  Positive for dizziness.    Updated Vital Signs BP (!) 130/50 (BP Location: Right Arm)   Pulse (!) 59   Temp 98.1 F (36.7 C) (Oral)   Resp 15   Ht 5' 6 (1.676 m)   Wt 99.8 kg   SpO2 96%   BMI 35.51 kg/m   Physical Exam Vitals and nursing note reviewed.  Constitutional:      General: She is not in acute distress.    Appearance: She is well-developed. She is not diaphoretic.  HENT:     Head: Normocephalic and atraumatic.     Right Ear: Tympanic membrane and external ear normal.     Left Ear: Tympanic membrane and external ear normal.     Nose: Nose normal.     Mouth/Throat:     Mouth: Mucous membranes are moist.  Eyes:     General: No scleral icterus.    Extraocular Movements: Extraocular movements intact.     Conjunctiva/sclera: Conjunctivae normal.     Pupils: Pupils are equal, round, and reactive to light.  Cardiovascular:     Rate and Rhythm: Normal rate and regular rhythm.     Heart sounds: Normal heart sounds. No murmur heard.    No friction rub. No gallop.  Pulmonary:     Effort: Pulmonary  effort is normal. No respiratory distress.     Breath sounds: Normal breath sounds.  Abdominal:     General: Bowel sounds are normal. There is no distension.     Palpations: Abdomen is soft. There is no mass.     Tenderness: There is no abdominal tenderness. There is no guarding.  Musculoskeletal:     Cervical back: Normal range of motion.  Skin:    General: Skin is warm and dry.  Neurological:     Mental Status: She is alert and oriented to person, place, and time.  Cranial Nerves: No cranial nerve deficit.     Sensory: No sensory deficit.     Motor: No weakness.     Coordination: Coordination normal.     Gait: Gait normal.     Deep Tendon Reflexes: Reflexes normal.     Comments: Head impulse with positive catch-up saccade on the right, Sustained rightward beat nystagmus which induces some spinning Negative test of skew Normal finger-to-nose, rapid alternating movements, heel-to-shin Ambulatory with only slight imbalance when turning around to walk back to the bed  Psychiatric:        Behavior: Behavior normal.     (all labs ordered are listed, but only abnormal results are displayed) Labs Reviewed  CBC WITH DIFFERENTIAL/PLATELET - Abnormal; Notable for the following components:      Result Value   RDW 15.6 (*)    All other components within normal limits  COMPREHENSIVE METABOLIC PANEL WITH GFR    EKG: None  Radiology: No results found.   Procedures   Medications Ordered in the ED  meclizine  (ANTIVERT ) tablet 25 mg (25 mg Oral Given 05/18/24 1712)    Clinical Course as of 05/18/24 1918  Thu May 18, 2024  1851 CBC with Differential(!) [AH]  1851 Comprehensive metabolic panel [AH]  1851 DG Hand Complete Right [AH]  1851 DG Foot 2 Views Right [AH]  1851 CT Head Wo Contrast [AH]    Clinical Course User Index [AH] Arloa Chroman, PA-C                                 Medical Decision Making This is a 64 year old female with a past medical history of  multiple comorbidities.  She presents with 6 months of intermittent vertigo She has a sense of fullness in her right ear. Differential diagnoses include BPPV labyrinthitis acoustic neuritis, Mnire's disease posterior circulation stroke, vertebral artery dissection.   Comorbidities complicating workup include history of COPD and smoking, hyperlipidemia, hypertension, history of unstable angina.  On physical examination patient's exam points toward a diagnosis of peripheral vertigo given her hints examination.  She has also had intermittent waxing and waning symptoms for 6 months.  She did not have any evidence of otitis media or infection.  She was given a dose of meclizine  which improved her symptoms.  Patient will be discharged with meclizine , Epley's remover, referral to PCP as she may benefit from vestibular rehab.  I do not think that she needs further evaluation for central cause of vertigo at this time.  Is ambulating without assistance and appears appropriate for discharge.        Amount and/or Complexity of Data Reviewed Labs: ordered. Decision-making details documented in ED Course. Radiology: ordered. Decision-making details documented in ED Course.   =     Final diagnoses:  None    ED Discharge Orders     None          Arloa Chroman, PA-C 05/18/24 WINDELL Doretha Folks, MD 05/18/24 2330  "

## 2024-05-24 ENCOUNTER — Ambulatory Visit: Admitting: Nurse Practitioner

## 2024-05-24 ENCOUNTER — Encounter: Payer: Self-pay | Admitting: *Deleted

## 2024-05-24 VITALS — Ht 66.0 in | Wt 230.0 lb

## 2024-05-24 DIAGNOSIS — R55 Syncope and collapse: Secondary | ICD-10-CM

## 2024-05-24 DIAGNOSIS — I5189 Other ill-defined heart diseases: Secondary | ICD-10-CM | POA: Diagnosis not present

## 2024-05-24 DIAGNOSIS — I2510 Atherosclerotic heart disease of native coronary artery without angina pectoris: Secondary | ICD-10-CM

## 2024-05-24 DIAGNOSIS — I6523 Occlusion and stenosis of bilateral carotid arteries: Secondary | ICD-10-CM | POA: Diagnosis not present

## 2024-05-24 DIAGNOSIS — E782 Mixed hyperlipidemia: Secondary | ICD-10-CM

## 2024-05-24 LAB — LIPID PANEL
Chol/HDL Ratio: 2.3 ratio (ref 0.0–4.4)
Cholesterol, Total: 159 mg/dL (ref 100–199)
HDL: 68 mg/dL
LDL Chol Calc (NIH): 74 mg/dL (ref 0–99)
Triglycerides: 93 mg/dL (ref 0–149)
VLDL Cholesterol Cal: 17 mg/dL (ref 5–40)

## 2024-05-24 MED ORDER — METOPROLOL SUCCINATE ER 25 MG PO TB24: 12.5000 mg | ORAL_TABLET | Freq: Every day | ORAL | 3 refills | Status: AC

## 2024-05-24 MED ORDER — POTASSIUM CHLORIDE ER 10 MEQ PO TBCR: 10.0000 meq | EXTENDED_RELEASE_TABLET | Freq: Every day | ORAL | 3 refills | Status: AC | PRN

## 2024-05-24 MED ORDER — EZETIMIBE 10 MG PO TABS: 10.0000 mg | ORAL_TABLET | Freq: Every day | ORAL | 3 refills | Status: AC

## 2024-05-24 MED ORDER — NITROGLYCERIN 0.4 MG SL SUBL: 0.4000 mg | SUBLINGUAL_TABLET | SUBLINGUAL | 3 refills | Status: AC | PRN

## 2024-05-24 MED ORDER — LOSARTAN POTASSIUM 25 MG PO TABS: 25.0000 mg | ORAL_TABLET | Freq: Every day | ORAL | 3 refills | Status: AC

## 2024-05-24 MED ORDER — FUROSEMIDE 20 MG PO TABS: 20.0000 mg | ORAL_TABLET | Freq: Every day | ORAL | 3 refills | Status: AC | PRN

## 2024-05-24 MED ORDER — AMLODIPINE BESYLATE 10 MG PO TABS: 10.0000 mg | ORAL_TABLET | Freq: Every day | ORAL | 3 refills | Status: AC

## 2024-05-24 MED ORDER — CLOPIDOGREL BISULFATE 75 MG PO TABS: 75.0000 mg | ORAL_TABLET | Freq: Every day | ORAL | 3 refills | Status: AC

## 2024-05-24 MED ORDER — ISOSORBIDE MONONITRATE ER 30 MG PO TB24: 30.0000 mg | ORAL_TABLET | Freq: Every day | ORAL | 3 refills | Status: AC

## 2024-05-24 NOTE — Progress Notes (Unsigned)
 Patient enrolled for [employer]/ [employer] to ship a 30 day cardiac event monitor to her address on file. Dr. Barbaraann to read.

## 2024-05-24 NOTE — Patient Instructions (Addendum)
 Medication Instructions:  Decrease Metoprolol  Succinate 12.5 mg daily Lasix  20 mg daily as needed only for weight gain and swelling of 3 lb over night or 5 lb in 1 week. Potassium 10 mEq take daily as needed on the days you take Lasix .  *If you need a refill on your cardiac medications before your next appointment, please call your pharmacy*  Lab Work: Lipid panel today  Testing/Procedures: Your physician has recommended that you wear an event monitor for 30 days. Event monitors are medical devices that record the hearts electrical activity. Doctors most often us  these monitors to diagnose arrhythmias. Arrhythmias are problems with the speed or rhythm of the heartbeat. The monitor is a small, portable device. You can wear one while you do your normal daily activities. This is usually used to diagnose what is causing palpitations/syncope (passing out).   Your physician has requested that you have an echocardiogram. Echocardiography is a painless test that uses sound waves to create images of your heart. It provides your doctor with information about the size and shape of your heart and how well your hearts chambers and valves are working. This procedure takes approximately one hour. There are no restrictions for this procedure. Please do NOT wear cologne, perfume, aftershave, or lotions (deodorant is allowed). Please arrive 15 minutes prior to your appointment time.  Your physician has requested that you have a carotid duplex. This test is an ultrasound of the carotid arteries in your neck. It looks at blood flow through these arteries that supply the brain with blood. Allow one hour for this exam. There are no restrictions or special instructions.   Please note: We ask at that you not bring children with you during ultrasound (echo/ vascular) testing. Due to room size and safety concerns, children are not allowed in the ultrasound rooms during exams. Our front office staff cannot provide  observation of children in our lobby area while testing is being conducted. An adult accompanying a patient to their appointment will only be allowed in the ultrasound room at the discretion of the ultrasound technician under special circumstances. We apologize for any inconvenience.  Follow-Up: At [facility], you and your health needs are our priority.  As part of our continuing mission to provide you with exceptional heart care, our providers are all part of one team.  This team includes your primary Cardiologist (physician) and Advanced Practice Providers or APPs (Physician Assistants and Nurse Practitioners) who all work together to provide you with the care you need, when you need it.  Your next appointment:   6-8 weeks any APP    We recommend signing up for the patient portal called MyChart.  Sign up information is provided on this After Visit Summary.  MyChart is used to connect with patients for Virtual Visits (Telemedicine).  Patients are able to view lab/test results, encounter notes, upcoming appointments, etc.  Non-urgent messages can be sent to your provider as well.   To learn more about what you can do with MyChart, go to [email or web address].   Other Instructions [employer] Cardiac Event Monitor Instructions  Your physician has requested you wear your cardiac event monitor for _____ days, (1-30). [employer] may call or text to confirm a shipping address. The monitor will be sent to a land address via UPS. [employer] will not ship a monitor to a PO BOX. It typically takes 3-5 days to receive your monitor after it has been enrolled. [employer] will assist with USPS tracking if your package is  delayed. The telephone number for [employer] is [phone]. Once you have received your monitor, please review the enclosed instructions. Instruction tutorials can also be viewed under help and settings on the enclosed cell phone. Your monitor has already been registered  assigning a specific monitor serial # to you.  Billing and Self Pay Discount Information  [employer] has been provided the insurance information we had on file for you.  If your insurance has been updated, please call [employer] at [phone] to provide them with your updated insurance information.   [employer] offers a discounted Self Pay option for patients who have insurance that does not cover their cardiac event monitor or patients without insurance.  The discounted cost of a Self Pay Cardiac Event Monitor would be $225.00 , if the patient contacts [employer] at [phone] within 7 days of applying the monitor to make payment arrangements.  If the patient does not contact [employer] within 7 days of applying the monitor, the cost of the cardiac event monitor will be $350.00.  Applying the monitor  Remove cell phone from case and turn it on. The cell phone works as it consultant and needs to be within [employer] of you at all times. The cell phone will need to be charged on a daily basis. We recommend you plug the cell phone into the enclosed charger at your bedside table every night.  Monitor batteries: You will receive two monitor batteries labelled #1 and #2. These are your recorders. Plug battery #2 onto the second connection on the enclosed charger. Keep one battery on the charger at all times. This will keep the monitor battery deactivated. It will also keep it fully charged for when you need to switch your monitor batteries. A small light will be blinking on the battery emblem when it is charging. The light on the battery emblem will remain on when the battery is fully charged.  Open package of a Monitor strip. Insert battery #1 into black hood on strip and gently squeeze monitor battery onto connection as indicated in instruction booklet. Set aside while preparing skin.  Choose location for your strip, vertical or horizontal, as indicated in the instruction booklet. Shave to  remove all hair from location. There cannot be any lotions, oils, powders, or colognes on skin where monitor is to be applied. Wipe skin clean with enclosed Saline wipe. Dry skin completely.  Peel paper labeled #1 off the back of the Monitor strip exposing the adhesive. Place the monitor on the chest in the vertical or horizontal position shown in the instruction booklet. One arrow on the monitor strip must be pointing upward. Carefully remove paper labeled #2, attaching remainder of strip to your skin. Try not to create any folds or wrinkles in the strip as you apply it.  Firmly press and release the circle in the center of the monitor battery. You will hear a small beep. This is turning the monitor battery on. The heart emblem on the monitor battery will light up every 5 seconds if the monitor battery in turned on and connected to the patient securely. Do not push and hold the circle down as this turns the monitor battery off. The cell phone will locate the monitor battery. A screen will appear on the cell phone checking the connection of your monitor strip. This may read poor connection initially but change to good connection within the next minute. Once your monitor accepts the connection you will hear a series of 3 beeps followed by a climbing  crescendo of beeps. A screen will appear on the cell phone showing the two monitor strip placement options. Touch the picture that demonstrates where you applied the monitor strip.  Your monitor strip and battery are waterproof. You are able to shower, bathe, or swim with the monitor on. They just ask you do not submerge deeper than 3 feet underwater. We recommend removing the monitor if you are swimming in a lake, river, or ocean.  Your monitor battery will need to be switched to a fully charged monitor battery approximately once a week. The cell phone will alert you of an action which needs to be made.  On the cell phone, tap for details to  reveal connection status, monitor battery status, and cell phone battery status. The green dots indicates your monitor is in good status. A red dot indicates there is something that needs your attention.  To record a symptom, click the circle on the monitor battery. In 30-60 seconds a list of symptoms will appear on the cell phone. Select your symptom and tap save. Your monitor will record a sustained or significant arrhythmia regardless of you clicking the button. Some patients do not feel the heart rhythm irregularities. [employer] will notify us  of any serious or critical events.  Refer to instruction booklet for instructions on switching batteries, changing strips, the Do not disturb or Pause features, or any additional questions.  Call [employer] at [phone], to confirm your monitor is transmitting and record your baseline. They will answer any questions you may have regarding the monitor instructions at that time.  Returning the monitor to [employer]  Place all equipment back into blue box. Peel off strip of paper to expose adhesive and close box securely. There is a prepaid UPS shipping label on this box. Drop in a UPS drop box, or at a UPS facility like [employer]. You may also contact [employer] to arrange UPS to pick up monitor package at your home.

## 2024-05-25 ENCOUNTER — Ambulatory Visit: Payer: Self-pay

## 2024-05-25 DIAGNOSIS — I10 Essential (primary) hypertension: Secondary | ICD-10-CM

## 2024-05-25 DIAGNOSIS — E782 Mixed hyperlipidemia: Secondary | ICD-10-CM

## 2024-05-26 NOTE — Telephone Encounter (Signed)
 Spoke with pt. Pt reviewed lab results on mychart. Pt is aware that Pharm-D referral was placed and someone from our office will contact her to schedule an appointment with the pharmacy team. Pt voiced understanding. Please arrange pharm-d apt. Thanks

## 2024-05-26 NOTE — Addendum Note (Signed)
 Addended by: GEORGINA HILA on: 05/26/2024 11:23 AM   Modules accepted: Orders

## 2024-05-29 ENCOUNTER — Inpatient Hospital Stay: Payer: Self-pay | Admitting: Student

## 2024-07-05 ENCOUNTER — Ambulatory Visit (HOSPITAL_COMMUNITY)

## 2024-07-12 ENCOUNTER — Ambulatory Visit: Admitting: Cardiology
